# Patient Record
Sex: Female | Born: 1955 | Race: White | Hispanic: No | Marital: Single | Smoking: Never smoker
Health system: Southern US, Community
[De-identification: ages and names within clinical notes are randomized; demographics above are authoritative.]

## PROBLEM LIST (undated history)

## (undated) DIAGNOSIS — D239 Other benign neoplasm of skin, unspecified: Secondary | ICD-10-CM

## (undated) DIAGNOSIS — L409 Psoriasis, unspecified: Secondary | ICD-10-CM

## (undated) DIAGNOSIS — M797 Fibromyalgia: Secondary | ICD-10-CM

## (undated) DIAGNOSIS — F3181 Bipolar II disorder: Secondary | ICD-10-CM

## (undated) HISTORY — DX: Other benign neoplasm of skin, unspecified: D23.9

## (undated) HISTORY — PX: TONSILLECTOMY: SUR1361

## (undated) HISTORY — PX: ADENOIDECTOMY: SUR15

## (undated) HISTORY — PX: ABDOMINAL HYSTERECTOMY: SHX81

---

## 1988-10-06 HISTORY — PX: BREAST BIOPSY: SHX20

## 2005-06-12 ENCOUNTER — Ambulatory Visit: Payer: Self-pay | Admitting: Family Medicine

## 2005-08-07 ENCOUNTER — Ambulatory Visit: Payer: Self-pay | Admitting: Unknown Physician Specialty

## 2005-09-03 ENCOUNTER — Emergency Department: Payer: Self-pay | Admitting: Emergency Medicine

## 2006-06-15 ENCOUNTER — Ambulatory Visit: Payer: Self-pay | Admitting: Family Medicine

## 2007-10-28 ENCOUNTER — Ambulatory Visit: Payer: Self-pay | Admitting: Family Medicine

## 2008-10-30 ENCOUNTER — Ambulatory Visit: Payer: Self-pay | Admitting: Family Medicine

## 2008-11-10 ENCOUNTER — Ambulatory Visit: Payer: Self-pay | Admitting: Family Medicine

## 2009-03-21 ENCOUNTER — Encounter: Payer: Self-pay | Admitting: Nurse Practitioner

## 2009-04-05 ENCOUNTER — Encounter: Payer: Self-pay | Admitting: Nurse Practitioner

## 2009-11-13 ENCOUNTER — Ambulatory Visit: Payer: Self-pay | Admitting: Family Medicine

## 2010-10-08 ENCOUNTER — Ambulatory Visit: Payer: Self-pay | Admitting: Internal Medicine

## 2011-01-28 ENCOUNTER — Ambulatory Visit: Payer: Self-pay | Admitting: Family Medicine

## 2012-02-10 ENCOUNTER — Ambulatory Visit: Payer: Self-pay | Admitting: Family Medicine

## 2012-02-12 ENCOUNTER — Ambulatory Visit: Payer: Self-pay | Admitting: Family Medicine

## 2012-03-17 ENCOUNTER — Ambulatory Visit: Payer: Self-pay | Admitting: Family Medicine

## 2012-03-17 LAB — CBC WITH DIFFERENTIAL/PLATELET
Basophil #: 0 10*3/uL (ref 0.0–0.1)
Basophil %: 1 %
Eosinophil #: 0.1 10*3/uL (ref 0.0–0.7)
HCT: 40 % (ref 35.0–47.0)
HGB: 13.5 g/dL (ref 12.0–16.0)
Lymphocyte #: 1.5 10*3/uL (ref 1.0–3.6)
Lymphocyte %: 36.3 %
MCH: 32 pg (ref 26.0–34.0)
MCHC: 33.9 g/dL (ref 32.0–36.0)
Monocyte #: 0.3 x10 3/mm (ref 0.2–0.9)
Neutrophil %: 53.2 %
RBC: 4.24 10*6/uL (ref 3.80–5.20)
WBC: 4.2 10*3/uL (ref 3.6–11.0)

## 2012-03-17 LAB — FOLATE: Folic Acid: 13 ng/mL (ref 3.1–100.0)

## 2013-04-26 ENCOUNTER — Ambulatory Visit: Payer: Self-pay | Admitting: Family Medicine

## 2014-03-01 ENCOUNTER — Ambulatory Visit: Payer: Self-pay | Admitting: Family Medicine

## 2014-05-12 ENCOUNTER — Ambulatory Visit: Payer: Self-pay | Admitting: Unknown Physician Specialty

## 2014-05-18 ENCOUNTER — Ambulatory Visit: Payer: Self-pay | Admitting: Podiatrist

## 2014-05-25 ENCOUNTER — Encounter: Payer: Self-pay | Admitting: Podiatry

## 2014-05-25 ENCOUNTER — Ambulatory Visit (INDEPENDENT_AMBULATORY_CARE_PROVIDER_SITE_OTHER): Payer: Commercial Managed Care - HMO

## 2014-05-25 ENCOUNTER — Ambulatory Visit (INDEPENDENT_AMBULATORY_CARE_PROVIDER_SITE_OTHER): Payer: Commercial Managed Care - HMO | Admitting: Podiatry

## 2014-05-25 VITALS — BP 130/73 | HR 63 | Resp 16 | Ht 65.0 in | Wt 129.0 lb

## 2014-05-25 DIAGNOSIS — M7741 Metatarsalgia, right foot: Secondary | ICD-10-CM

## 2014-05-25 DIAGNOSIS — M7742 Metatarsalgia, left foot: Secondary | ICD-10-CM

## 2014-05-25 DIAGNOSIS — M722 Plantar fascial fibromatosis: Secondary | ICD-10-CM

## 2014-05-25 DIAGNOSIS — M7750 Other enthesopathy of unspecified foot: Secondary | ICD-10-CM

## 2014-05-25 MED ORDER — TRIAMCINOLONE ACETONIDE 10 MG/ML IJ SUSP
10.0000 mg | Freq: Once | INTRAMUSCULAR | Status: AC
  Administered 2014-05-25: 10 mg

## 2014-05-25 NOTE — Progress Notes (Signed)
   Subjective:    Patient ID: Cheryl Schroeder, female    DOB: Aug 15, 1956, 58 y.o.   MRN: [id number]  HPI Comments: 58 year old female presents the office today with multiple foot complaints bilaterally. 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 She also states that she has bilateral heel pain has been intermittent over the last 4 years which has increased recently specifically while walking. Also states the pain is worse in the morning or after. Her rest and gets some relief with ambulation. She had no previous treatment for this. She also states that she has pain in the balls of her feet which has been ongoing for many years. Denies any tenderness to the feet/toes.  No other complaints at this time.  Foot Pain      Review of Systems  HENT: Positive for sneezing.        Sinus problems   Musculoskeletal:       Joint pain  Neurological: Positive for dizziness.  Psychiatric/Behavioral: Positive for behavioral problems.  All other systems reviewed and are negative.      Objective:   Physical Exam  Nursing note and vitals reviewed. Constitutional: She is oriented to person, place, and time. She appears well-developed and well-nourished.  Musculoskeletal: Normal range of motion. She exhibits no edema.  Tenderness to palpation over the medial plantar tubercle of the calcaneus at the insertion of the plantar fascia bilaterally. No pain along the course of the plantar fascia within the arch and the plantar fascia is intact. Diffuse tenderness over the metatarsal heads bilaterally over an area of thin area of diffuse hyperkeratotic tissue. Is mild atrophy of the fat pad the metatarsal heads bilaterally. MMT 5/5. Rectus foot type. No tenderness over the left third interspace to suggest a neuroma.  Neurological: She is alert and oriented to person, place, and time.  Protective  sensation intact with the Semmes Weinstein monofilament and vibratory sensation intact.  Skin:  Thin, diffuse hyperkeratotic tissue along the metatarsal heads bilaterally. No open lesions.  DP/PT pulses palpable 2/4 b/l. CRT < 3 sec.         Assessment & Plan:  58 year old female with bilateral plantar fasciitis and metatarsalgia. -At this appointment x-rays were obtained of the bilateral feet. The x-ray result for full details. No acute fracture -Conservative versus surgical intervention was discussed with the patient in great detail including alternatives, risks, complications as well as the etiology. -For heel pain, at this time I would recommend a steroid injection into the area to help calm down some localized inflammation. Patient agrees. Under sterile skin preparation a total of a 2 cc mixture of Kenalog 10, Marcaine 0.5% plain, lidocaine 2% plain was infiltrated into the plantar medial aspect of the calcaneus around the insertion of the plantar fascia. This was preformed bilaterally.Patient tolerated the injection well without any complications. Dispensed bilateral plantar fascial braces. Discussed stretching exercises as well as ice to the area.  -Metatarsal pads dispensed. Upon weightbearing patient states that he felt good and gave her relief in the area. -Followup in one month or sooner if any palms are to arise. Call with any questions/concerns.

## 2014-05-25 NOTE — Patient Instructions (Signed)
Plantar Fasciitis (Heel Spur Syndrome) with Rehab The plantar fascia is a fibrous, ligament-like, soft-tissue structure that spans the bottom of the foot. Plantar fasciitis is a condition that causes pain in the foot due to inflammation of the tissue. SYMPTOMS   Pain and tenderness on the underneath side of the foot.  Pain that worsens with standing or walking. CAUSES  Plantar fasciitis is caused by irritation and injury to the plantar fascia on the underneath side of the foot. Common mechanisms of injury include:  Direct trauma to bottom of the foot.  Damage to a small nerve that runs under the foot where the main fascia attaches to the heel bone.  Stress placed on the plantar fascia due to bone spurs. RISK INCREASES WITH:   Activities that place stress on the plantar fascia (running, jumping, pivoting, or cutting).  Poor strength and flexibility.  Improperly fitted shoes.  Tight calf muscles.  Flat feet.  Failure to warm-up properly before activity.  Obesity. PREVENTION  Warm up and stretch properly before activity.  Allow for adequate recovery between workouts.  Maintain physical fitness:  Strength, flexibility, and endurance.  Cardiovascular fitness.  Maintain a health body weight.  Avoid stress on the plantar fascia.  Wear properly fitted shoes, including arch supports for individuals who have flat feet. PROGNOSIS  If treated properly, then the symptoms of plantar fasciitis usually resolve without surgery. However, occasionally surgery is necessary. RELATED COMPLICATIONS   Recurrent symptoms that may result in a chronic condition.  Problems of the lower back that are caused by compensating for the injury, such as limping.  Pain or weakness of the foot during push-off following surgery.  Chronic inflammation, scarring, and partial or complete fascia tear, occurring more often from repeated injections. TREATMENT  Treatment initially involves the use of  ice and medication to help reduce pain and inflammation. The use of strengthening and stretching exercises may help reduce pain with activity, especially stretches of the Achilles tendon. These exercises may be performed at home or with a therapist. Your caregiver may recommend that you use heel cups of arch supports to help reduce stress on the plantar fascia. Occasionally, corticosteroid injections are given to reduce inflammation. If symptoms persist for greater than 6 months despite non-surgical (conservative), then surgery may be recommended.  MEDICATION   If pain medication is necessary, then nonsteroidal anti-inflammatory medications, such as aspirin and ibuprofen, or other minor pain relievers, such as acetaminophen, are often recommended.  Do not take pain medication within 7 days before surgery.  Prescription pain relievers may be given if deemed necessary by your caregiver. Use only as directed and only as much as you need.  Corticosteroid injections may be given by your caregiver. These injections should be reserved for the most serious cases, because they may only be given a certain number of times. HEAT AND COLD  Cold treatment (icing) relieves pain and reduces inflammation. Cold treatment should be applied for 10 to 15 minutes every 2 to 3 hours for inflammation and pain and immediately after any activity that aggravates your symptoms. Use ice packs or massage the area with a piece of ice (ice massage).  Heat treatment may be used prior to performing the stretching and strengthening activities prescribed by your caregiver, physical therapist, or athletic trainer. Use a heat pack or soak the injury in warm water. SEEK IMMEDIATE MEDICAL CARE IF:  Treatment seems to offer no benefit, or the condition worsens.  Any medications produce adverse side effects. EXERCISES RANGE   OF MOTION (ROM) AND STRETCHING EXERCISES - Plantar Fasciitis (Heel Spur Syndrome) These exercises may help you  when beginning to rehabilitate your injury. Your symptoms may resolve with or without further involvement from your physician, physical therapist or athletic trainer. While completing these exercises, remember:   Restoring tissue flexibility helps normal motion to return to the joints. This allows healthier, less painful movement and activity.  An effective stretch should be held for at least 30 seconds.  A stretch should never be painful. You should only feel a gentle lengthening or release in the stretched tissue. RANGE OF MOTION - Toe Extension, Flexion  Sit with your right / left leg crossed over your opposite knee.  Grasp your toes and gently pull them back toward the top of your foot. You should feel a stretch on the bottom of your toes and/or foot.  Hold this stretch for __________ seconds.  Now, gently pull your toes toward the bottom of your foot. You should feel a stretch on the top of your toes and or foot.  Hold this stretch for __________ seconds. Repeat __________ times. Complete this stretch __________ times per day.  RANGE OF MOTION - Ankle Dorsiflexion, Active Assisted  Remove shoes and sit on a chair that is preferably not on a carpeted surface.  Place right / left foot under knee. Extend your opposite leg for support.  Keeping your heel down, slide your right / left foot back toward the chair until you feel a stretch at your ankle or calf. If you do not feel a stretch, slide your bottom forward to the edge of the chair, while still keeping your heel down.  Hold this stretch for __________ seconds. Repeat __________ times. Complete this stretch __________ times per day.  STRETCH - Gastroc, Standing  Place hands on wall.  Extend right / left leg, keeping the front knee somewhat bent.  Slightly point your toes inward on your back foot.  Keeping your right / left heel on the floor and your knee straight, shift your weight toward the wall, not allowing your back to  arch.  You should feel a gentle stretch in the right / left calf. Hold this position for __________ seconds. Repeat __________ times. Complete this stretch __________ times per day. STRETCH - Soleus, Standing  Place hands on wall.  Extend right / left leg, keeping the other knee somewhat bent.  Slightly point your toes inward on your back foot.  Keep your right / left heel on the floor, bend your back knee, and slightly shift your weight over the back leg so that you feel a gentle stretch deep in your back calf.  Hold this position for __________ seconds. Repeat __________ times. Complete this stretch __________ times per day. STRETCH - Gastrocsoleus, Standing  Note: This exercise can place a lot of stress on your foot and ankle. Please complete this exercise only if specifically instructed by your caregiver.   Place the ball of your right / left foot on a step, keeping your other foot firmly on the same step.  Hold on to the wall or a rail for balance.  Slowly lift your other foot, allowing your body weight to press your heel down over the edge of the step.  You should feel a stretch in your right / left calf.  Hold this position for __________ seconds.  Repeat this exercise with a slight bend in your right / left knee. Repeat __________ times. Complete this stretch __________ times per day.    STRENGTHENING EXERCISES - Plantar Fasciitis (Heel Spur Syndrome)  These exercises may help you when beginning to rehabilitate your injury. They may resolve your symptoms with or without further involvement from your physician, physical therapist or athletic trainer. While completing these exercises, remember:   Muscles can gain both the endurance and the strength needed for everyday activities through controlled exercises.  Complete these exercises as instructed by your physician, physical therapist or athletic trainer. Progress the resistance and repetitions only as guided. STRENGTH -  Towel Curls  Sit in a chair positioned on a non-carpeted surface.  Place your foot on a towel, keeping your heel on the floor.  Pull the towel toward your heel by only curling your toes. Keep your heel on the floor.  If instructed by your physician, physical therapist or athletic trainer, add ____________________ at the end of the towel. Repeat __________ times. Complete this exercise __________ times per day. STRENGTH - Ankle Inversion  Secure one end of a rubber exercise band/tubing to a fixed object (table, pole). Loop the other end around your foot just before your toes.  Place your fists between your knees. This will focus your strengthening at your ankle.  Slowly, pull your big toe up and in, making sure the band/tubing is positioned to resist the entire motion.  Hold this position for __________ seconds.  Have your muscles resist the band/tubing as it slowly pulls your foot back to the starting position. Repeat __________ times. Complete this exercises __________ times per day.  Document Released: 09/22/2005 Document Revised: 12/15/2011 Document Reviewed: 01/04/2009 [employer] Patient Information 2015 [employer]. This information is not intended to replace advice given to you by your health care provider. Make sure you discuss any questions you have with your health care provider.  

## 2014-06-29 ENCOUNTER — Ambulatory Visit: Payer: Commercial Managed Care - HMO | Admitting: Podiatry

## 2014-07-06 ENCOUNTER — Ambulatory Visit (INDEPENDENT_AMBULATORY_CARE_PROVIDER_SITE_OTHER): Payer: Commercial Managed Care - HMO | Admitting: Podiatry

## 2014-07-06 VITALS — BP 112/60 | HR 74 | Resp 16

## 2014-07-06 DIAGNOSIS — M722 Plantar fascial fibromatosis: Secondary | ICD-10-CM

## 2014-07-06 DIAGNOSIS — M7742 Metatarsalgia, left foot: Secondary | ICD-10-CM

## 2014-07-06 DIAGNOSIS — M7741 Metatarsalgia, right foot: Secondary | ICD-10-CM

## 2014-07-06 MED ORDER — TRIAMCINOLONE ACETONIDE 10 MG/ML IJ SUSP
10.0000 mg | Freq: Once | INTRAMUSCULAR | Status: AC
  Administered 2014-07-06: 10 mg

## 2014-07-06 NOTE — Patient Instructions (Signed)
Plantar Fasciitis (Heel Spur Syndrome) with Rehab The plantar fascia is a fibrous, ligament-like, soft-tissue structure that spans the bottom of the foot. Plantar fasciitis is a condition that causes pain in the foot due to inflammation of the tissue. SYMPTOMS   Pain and tenderness on the underneath side of the foot.  Pain that worsens with standing or walking. CAUSES  Plantar fasciitis is caused by irritation and injury to the plantar fascia on the underneath side of the foot. Common mechanisms of injury include:  Direct trauma to bottom of the foot.  Damage to a small nerve that runs under the foot where the main fascia attaches to the heel bone.  Stress placed on the plantar fascia due to bone spurs. RISK INCREASES WITH:   Activities that place stress on the plantar fascia (running, jumping, pivoting, or cutting).  Poor strength and flexibility.  Improperly fitted shoes.  Tight calf muscles.  Flat feet.  Failure to warm-up properly before activity.  Obesity. PREVENTION  Warm up and stretch properly before activity.  Allow for adequate recovery between workouts.  Maintain physical fitness:  Strength, flexibility, and endurance.  Cardiovascular fitness.  Maintain a health body weight.  Avoid stress on the plantar fascia.  Wear properly fitted shoes, including arch supports for individuals who have flat feet. PROGNOSIS  If treated properly, then the symptoms of plantar fasciitis usually resolve without surgery. However, occasionally surgery is necessary. RELATED COMPLICATIONS   Recurrent symptoms that may result in a chronic condition.  Problems of the lower back that are caused by compensating for the injury, such as limping.  Pain or weakness of the foot during push-off following surgery.  Chronic inflammation, scarring, and partial or complete fascia tear, occurring more often from repeated injections. TREATMENT  Treatment initially involves the use of  ice and medication to help reduce pain and inflammation. The use of strengthening and stretching exercises may help reduce pain with activity, especially stretches of the Achilles tendon. These exercises may be performed at home or with a therapist. Your caregiver may recommend that you use heel cups of arch supports to help reduce stress on the plantar fascia. Occasionally, corticosteroid injections are given to reduce inflammation. If symptoms persist for greater than 6 months despite non-surgical (conservative), then surgery may be recommended.  MEDICATION   If pain medication is necessary, then nonsteroidal anti-inflammatory medications, such as aspirin and ibuprofen, or other minor pain relievers, such as acetaminophen, are often recommended.  Do not take pain medication within 7 days before surgery.  Prescription pain relievers may be given if deemed necessary by your caregiver. Use only as directed and only as much as you need.  Corticosteroid injections may be given by your caregiver. These injections should be reserved for the most serious cases, because they may only be given a certain number of times. HEAT AND COLD  Cold treatment (icing) relieves pain and reduces inflammation. Cold treatment should be applied for 10 to 15 minutes every 2 to 3 hours for inflammation and pain and immediately after any activity that aggravates your symptoms. Use ice packs or massage the area with a piece of ice (ice massage).  Heat treatment may be used prior to performing the stretching and strengthening activities prescribed by your caregiver, physical therapist, or athletic trainer. Use a heat pack or soak the injury in warm water. SEEK IMMEDIATE MEDICAL CARE IF:  Treatment seems to offer no benefit, or the condition worsens.  Any medications produce adverse side effects. EXERCISES RANGE   OF MOTION (ROM) AND STRETCHING EXERCISES - Plantar Fasciitis (Heel Spur Syndrome) These exercises may help you  when beginning to rehabilitate your injury. Your symptoms may resolve with or without further involvement from your physician, physical therapist or athletic trainer. While completing these exercises, remember:   Restoring tissue flexibility helps normal motion to return to the joints. This allows healthier, less painful movement and activity.  An effective stretch should be held for at least 30 seconds.  A stretch should never be painful. You should only feel a gentle lengthening or release in the stretched tissue. RANGE OF MOTION - Toe Extension, Flexion  Sit with your right / left leg crossed over your opposite knee.  Grasp your toes and gently pull them back toward the top of your foot. You should feel a stretch on the bottom of your toes and/or foot.  Hold this stretch for __________ seconds.  Now, gently pull your toes toward the bottom of your foot. You should feel a stretch on the top of your toes and or foot.  Hold this stretch for __________ seconds. Repeat __________ times. Complete this stretch __________ times per day.  RANGE OF MOTION - Ankle Dorsiflexion, Active Assisted  Remove shoes and sit on a chair that is preferably not on a carpeted surface.  Place right / left foot under knee. Extend your opposite leg for support.  Keeping your heel down, slide your right / left foot back toward the chair until you feel a stretch at your ankle or calf. If you do not feel a stretch, slide your bottom forward to the edge of the chair, while still keeping your heel down.  Hold this stretch for __________ seconds. Repeat __________ times. Complete this stretch __________ times per day.  STRETCH - Gastroc, Standing  Place hands on wall.  Extend right / left leg, keeping the front knee somewhat bent.  Slightly point your toes inward on your back foot.  Keeping your right / left heel on the floor and your knee straight, shift your weight toward the wall, not allowing your back to  arch.  You should feel a gentle stretch in the right / left calf. Hold this position for __________ seconds. Repeat __________ times. Complete this stretch __________ times per day. STRETCH - Soleus, Standing  Place hands on wall.  Extend right / left leg, keeping the other knee somewhat bent.  Slightly point your toes inward on your back foot.  Keep your right / left heel on the floor, bend your back knee, and slightly shift your weight over the back leg so that you feel a gentle stretch deep in your back calf.  Hold this position for __________ seconds. Repeat __________ times. Complete this stretch __________ times per day. STRETCH - Gastrocsoleus, Standing  Note: This exercise can place a lot of stress on your foot and ankle. Please complete this exercise only if specifically instructed by your caregiver.   Place the ball of your right / left foot on a step, keeping your other foot firmly on the same step.  Hold on to the wall or a rail for balance.  Slowly lift your other foot, allowing your body weight to press your heel down over the edge of the step.  You should feel a stretch in your right / left calf.  Hold this position for __________ seconds.  Repeat this exercise with a slight bend in your right / left knee. Repeat __________ times. Complete this stretch __________ times per day.    STRENGTHENING EXERCISES - Plantar Fasciitis (Heel Spur Syndrome)  These exercises may help you when beginning to rehabilitate your injury. They may resolve your symptoms with or without further involvement from your physician, physical therapist or athletic trainer. While completing these exercises, remember:   Muscles can gain both the endurance and the strength needed for everyday activities through controlled exercises.  Complete these exercises as instructed by your physician, physical therapist or athletic trainer. Progress the resistance and repetitions only as guided. STRENGTH -  Towel Curls  Sit in a chair positioned on a non-carpeted surface.  Place your foot on a towel, keeping your heel on the floor.  Pull the towel toward your heel by only curling your toes. Keep your heel on the floor.  If instructed by your physician, physical therapist or athletic trainer, add ____________________ at the end of the towel. Repeat __________ times. Complete this exercise __________ times per day. STRENGTH - Ankle Inversion  Secure one end of a rubber exercise band/tubing to a fixed object (table, pole). Loop the other end around your foot just before your toes.  Place your fists between your knees. This will focus your strengthening at your ankle.  Slowly, pull your big toe up and in, making sure the band/tubing is positioned to resist the entire motion.  Hold this position for __________ seconds.  Have your muscles resist the band/tubing as it slowly pulls your foot back to the starting position. Repeat __________ times. Complete this exercises __________ times per day.  Document Released: 09/22/2005 Document Revised: 12/15/2011 Document Reviewed: 01/04/2009 [employer] Patient Information 2015 [employer]. This information is not intended to replace advice given to you by your health care provider. Make sure you discuss any questions you have with your health care provider.  

## 2014-07-09 DIAGNOSIS — M722 Plantar fascial fibromatosis: Secondary | ICD-10-CM | POA: Insufficient documentation

## 2014-07-09 NOTE — Progress Notes (Signed)
Patient ID: Cheryl Schroeder, female   DOB: 25-Jul-1956, 58 y.o.   MRN: [id number]  Subjective: Cheryl Schroeder returns the office they for followup evaluation of bilateral plantar fasciitis and metatarsalgia. She states that since last appointment after receiving injections her heels been improved. She has been continuing with icing, stretching. She has been wearing the plantar fascial braces. She does that she wore the metatarsal pads in the first couple days after the last appointment but since discontinued them as they were warned palpation she was unable to purchase an over-the-counter. No other complaints at this time. No acute changes since last appointment.  Objective: AAO x3, NAD DP/PT pulses palpable 2/4 b/l. CRT < 3 sec Protective sensation intact with Simms Weinstein monofilament, vibratory sensation intact, Achilles tendon reflex intact. Mild tenderness to palpation of the plantar medial tubercle of the calcaneus near the insertion the plantar fascia bilaterally. No pain along the course of plantar fascial within the arch of the foot. No pain with lateral compression of the calcaneus or along the posterior aspect. Overall rectus foot type. Mild diffuse tenderness over the plantar metatarsal heads with thinning of the fat pad. No pinpoint bony tenderness or pain the vibratory sensation.MMT 5/5, ROM WNL No open skin lesions. No calf pain, swelling, warmth.  Assessment: 58 year old female bilateral plantar fasciitis and metatarsalgia. -Conservative versus surgical treatment were discussed including alternatives, risks, complications. -Patient elects to proceed with steroid injection into the bilateral heels. Under sterile skin preparation, a total of 2.5cc of kenalog 10, 0.5% Marcaine plain, and 2% lidocaine plain were infiltrated into the symptomatic area without complication. A band-aid was applied. Patient tolerated the injection well without complication.  -Continue ice, stretching, plantar  fascial braces. -Dispensed new metatarsal pads. Discussed possible need for orthotics. -Followup in 3 weeks or sooner if any problems are to arise or any change in symptoms. In the meantime call the office with any questions, concerns.

## 2014-07-27 ENCOUNTER — Encounter: Payer: Self-pay | Admitting: Podiatry

## 2014-07-27 ENCOUNTER — Ambulatory Visit (INDEPENDENT_AMBULATORY_CARE_PROVIDER_SITE_OTHER): Payer: Medicare PPO | Admitting: Podiatry

## 2014-07-27 VITALS — BP 107/67 | HR 68 | Resp 17

## 2014-07-27 DIAGNOSIS — M7741 Metatarsalgia, right foot: Secondary | ICD-10-CM

## 2014-07-27 DIAGNOSIS — M7742 Metatarsalgia, left foot: Secondary | ICD-10-CM

## 2014-07-27 DIAGNOSIS — M722 Plantar fascial fibromatosis: Secondary | ICD-10-CM

## 2014-07-27 NOTE — Patient Instructions (Signed)
Plantar Fasciitis (Heel Spur Syndrome) with Rehab The plantar fascia is a fibrous, ligament-like, soft-tissue structure that spans the bottom of the foot. Plantar fasciitis is a condition that causes pain in the foot due to inflammation of the tissue. SYMPTOMS   Pain and tenderness on the underneath side of the foot.  Pain that worsens with standing or walking. CAUSES  Plantar fasciitis is caused by irritation and injury to the plantar fascia on the underneath side of the foot. Common mechanisms of injury include:  Direct trauma to bottom of the foot.  Damage to a small nerve that runs under the foot where the main fascia attaches to the heel bone.  Stress placed on the plantar fascia due to bone spurs. RISK INCREASES WITH:   Activities that place stress on the plantar fascia (running, jumping, pivoting, or cutting).  Poor strength and flexibility.  Improperly fitted shoes.  Tight calf muscles.  Flat feet.  Failure to warm-up properly before activity.  Obesity. PREVENTION  Warm up and stretch properly before activity.  Allow for adequate recovery between workouts.  Maintain physical fitness:  Strength, flexibility, and endurance.  Cardiovascular fitness.  Maintain a health body weight.  Avoid stress on the plantar fascia.  Wear properly fitted shoes, including arch supports for individuals who have flat feet. PROGNOSIS  If treated properly, then the symptoms of plantar fasciitis usually resolve without surgery. However, occasionally surgery is necessary. RELATED COMPLICATIONS   Recurrent symptoms that may result in a chronic condition.  Problems of the lower back that are caused by compensating for the injury, such as limping.  Pain or weakness of the foot during push-off following surgery.  Chronic inflammation, scarring, and partial or complete fascia tear, occurring more often from repeated injections. TREATMENT  Treatment initially involves the use of  ice and medication to help reduce pain and inflammation. The use of strengthening and stretching exercises may help reduce pain with activity, especially stretches of the Achilles tendon. These exercises may be performed at home or with a therapist. Your caregiver may recommend that you use heel cups of arch supports to help reduce stress on the plantar fascia. Occasionally, corticosteroid injections are given to reduce inflammation. If symptoms persist for greater than 6 months despite non-surgical (conservative), then surgery may be recommended.  MEDICATION   If pain medication is necessary, then nonsteroidal anti-inflammatory medications, such as aspirin and ibuprofen, or other minor pain relievers, such as acetaminophen, are often recommended.  Do not take pain medication within 7 days before surgery.  Prescription pain relievers may be given if deemed necessary by your caregiver. Use only as directed and only as much as you need.  Corticosteroid injections may be given by your caregiver. These injections should be reserved for the most serious cases, because they may only be given a certain number of times. HEAT AND COLD  Cold treatment (icing) relieves pain and reduces inflammation. Cold treatment should be applied for 10 to 15 minutes every 2 to 3 hours for inflammation and pain and immediately after any activity that aggravates your symptoms. Use ice packs or massage the area with a piece of ice (ice massage).  Heat treatment may be used prior to performing the stretching and strengthening activities prescribed by your caregiver, physical therapist, or athletic trainer. Use a heat pack or soak the injury in warm water. SEEK IMMEDIATE MEDICAL CARE IF:  Treatment seems to offer no benefit, or the condition worsens.  Any medications produce adverse side effects. EXERCISES RANGE   OF MOTION (ROM) AND STRETCHING EXERCISES - Plantar Fasciitis (Heel Spur Syndrome) These exercises may help you  when beginning to rehabilitate your injury. Your symptoms may resolve with or without further involvement from your physician, physical therapist or athletic trainer. While completing these exercises, remember:   Restoring tissue flexibility helps normal motion to return to the joints. This allows healthier, less painful movement and activity.  An effective stretch should be held for at least 30 seconds.  A stretch should never be painful. You should only feel a gentle lengthening or release in the stretched tissue. RANGE OF MOTION - Toe Extension, Flexion  Sit with your right / left leg crossed over your opposite knee.  Grasp your toes and gently pull them back toward the top of your foot. You should feel a stretch on the bottom of your toes and/or foot.  Hold this stretch for __________ seconds.  Now, gently pull your toes toward the bottom of your foot. You should feel a stretch on the top of your toes and or foot.  Hold this stretch for __________ seconds. Repeat __________ times. Complete this stretch __________ times per day.  RANGE OF MOTION - Ankle Dorsiflexion, Active Assisted  Remove shoes and sit on a chair that is preferably not on a carpeted surface.  Place right / left foot under knee. Extend your opposite leg for support.  Keeping your heel down, slide your right / left foot back toward the chair until you feel a stretch at your ankle or calf. If you do not feel a stretch, slide your bottom forward to the edge of the chair, while still keeping your heel down.  Hold this stretch for __________ seconds. Repeat __________ times. Complete this stretch __________ times per day.  STRETCH - Gastroc, Standing  Place hands on wall.  Extend right / left leg, keeping the front knee somewhat bent.  Slightly point your toes inward on your back foot.  Keeping your right / left heel on the floor and your knee straight, shift your weight toward the wall, not allowing your back to  arch.  You should feel a gentle stretch in the right / left calf. Hold this position for __________ seconds. Repeat __________ times. Complete this stretch __________ times per day. STRETCH - Soleus, Standing  Place hands on wall.  Extend right / left leg, keeping the other knee somewhat bent.  Slightly point your toes inward on your back foot.  Keep your right / left heel on the floor, bend your back knee, and slightly shift your weight over the back leg so that you feel a gentle stretch deep in your back calf.  Hold this position for __________ seconds. Repeat __________ times. Complete this stretch __________ times per day. STRETCH - Gastrocsoleus, Standing  Note: This exercise can place a lot of stress on your foot and ankle. Please complete this exercise only if specifically instructed by your caregiver.   Place the ball of your right / left foot on a step, keeping your other foot firmly on the same step.  Hold on to the wall or a rail for balance.  Slowly lift your other foot, allowing your body weight to press your heel down over the edge of the step.  You should feel a stretch in your right / left calf.  Hold this position for __________ seconds.  Repeat this exercise with a slight bend in your right / left knee. Repeat __________ times. Complete this stretch __________ times per day.    STRENGTHENING EXERCISES - Plantar Fasciitis (Heel Spur Syndrome)  These exercises may help you when beginning to rehabilitate your injury. They may resolve your symptoms with or without further involvement from your physician, physical therapist or athletic trainer. While completing these exercises, remember:   Muscles can gain both the endurance and the strength needed for everyday activities through controlled exercises.  Complete these exercises as instructed by your physician, physical therapist or athletic trainer. Progress the resistance and repetitions only as guided. STRENGTH -  Towel Curls  Sit in a chair positioned on a non-carpeted surface.  Place your foot on a towel, keeping your heel on the floor.  Pull the towel toward your heel by only curling your toes. Keep your heel on the floor.  If instructed by your physician, physical therapist or athletic trainer, add ____________________ at the end of the towel. Repeat __________ times. Complete this exercise __________ times per day. STRENGTH - Ankle Inversion  Secure one end of a rubber exercise band/tubing to a fixed object (table, pole). Loop the other end around your foot just before your toes.  Place your fists between your knees. This will focus your strengthening at your ankle.  Slowly, pull your big toe up and in, making sure the band/tubing is positioned to resist the entire motion.  Hold this position for __________ seconds.  Have your muscles resist the band/tubing as it slowly pulls your foot back to the starting position. Repeat __________ times. Complete this exercises __________ times per day.  Document Released: 09/22/2005 Document Revised: 12/15/2011 Document Reviewed: 01/04/2009 [employer] Patient Information 2015 [employer]. This information is not intended to replace advice given to you by your health care provider. Make sure you discuss any questions you have with your health care provider.  

## 2014-07-27 NOTE — Progress Notes (Signed)
Patient ID: Cheryl Schroeder, female   DOB: 1956/09/12, 58 y.o.   MRN: [id number]  Subjective: Patient returns the office they for followup evaluation of bilateral plantar fasciitis and metatarsalgia. She states that since wearing the inserts and metatarsal pads she has noticed improvement in symptoms in the ball of her foot. She also states that her heel pain has improved although she continues to have some discomfort. She states that she has not been icing or stretching. She's been continuing with a tennis shoes and wearing plantar fascial brace. Denies any acute changes since last appointment. No other complaints at this time.  Objective: AAO x3, NAD DP/PT pulses palpable bilaterally, CRT less than 3 seconds Protective sensation intact with Simms Weinstein monofilament, vibratory sensation intact, Achilles tendon reflex intact Mild tenderness on the plantar medial tubercle of the calcaneus bilaterally at the insertion of the plantar fascia. No pain with lateral compression of the calcaneus or along the posterior aspect. No pain along the course of plantar fascial within the arch of the foot. Plantar fascia appears intact. Mild diffuse tenderness plantar metatarsal heads 1 through 5. There is no pinpoint bony tenderness or pain with vibratory sensation. No pain with range of motion of the MTPJ's. MMT 5/5, ROM WNL No calf pain, swelling, warmth No open lesions.  Assessment: 58 year old female with bilateral plantar fasciitis, metatarsalgia.  Plan: -Conservative versus surgical treatment discussed including alternatives, risks, complications -Discussed with the patient to continue stretching exercises as well as icing of the affected area. -Continue with supportive shoe gear and plantar fascial brace. -Continue with inserts and metatarsal pads. Discussed with her possible need for custom inserts. -Patient wishes to hold off on an injection at this time. -Followup in one month or sooner if any  problems are to occur or any changes symptoms. Patient states that she will call for appointment. Call with any questions, concerns.

## 2014-09-05 ENCOUNTER — Ambulatory Visit: Payer: Self-pay | Admitting: Family Medicine

## 2015-03-27 ENCOUNTER — Ambulatory Visit
Admission: EM | Admit: 2015-03-27 | Discharge: 2015-03-27 | Disposition: A | Discharge status: Home / Self Care | Payer: Medicare HMO | Attending: Family Medicine | Admitting: Family Medicine

## 2015-03-27 ENCOUNTER — Encounter: Payer: Self-pay | Admitting: Emergency Medicine

## 2015-03-27 DIAGNOSIS — L259 Unspecified contact dermatitis, unspecified cause: Secondary | ICD-10-CM

## 2015-03-27 HISTORY — DX: Psoriasis, unspecified: L40.9

## 2015-03-27 HISTORY — DX: Fibromyalgia: M79.7

## 2015-03-27 HISTORY — DX: Bipolar II disorder: F31.81

## 2015-03-27 MED ORDER — TRIAMCINOLONE ACETONIDE 0.1 % EX CREA: 1.0000 "application " | TOPICAL_CREAM | Freq: Three times a day (TID) | CUTANEOUS | Status: DC

## 2015-03-27 MED ORDER — PREDNISONE 20 MG PO TABS: 20.0000 mg | ORAL_TABLET | Freq: Every day | ORAL | Status: DC

## 2015-03-27 NOTE — ED Notes (Signed)
Patient c/o itchy rash on her left arm, neck and abdomen for a week.

## 2015-03-27 NOTE — ED Provider Notes (Signed)
Marland KitchenedCSN: [id number]     Arrival date & time 03/27/15  1045 History   First MD Initiated Contact with Patient 03/27/15 1159     Chief Complaint  Patient presents with  . Rash   (Consider location/radiation/quality/duration/timing/severity/associated sxs/prior Treatment) HPI Comments: 59 yo female with a reported exposure to poison ivy and an itchy rash on her forearm mainly and slightly on upper chest and right side abdomen. Denies any fevers or chills. Has tried otc medications without any relief.   The history is provided by the patient.    Past Medical History  Diagnosis Date  . Bipolar 2 disorder   . Fibromyalgia   . Psoriasis    Past Surgical History  Procedure Laterality Date  . Tonsillectomy    . Adenoidectomy    . Abdominal hysterectomy     History reviewed. No pertinent family history. History  Substance Use Topics  . Smoking status: Never Smoker   . Smokeless tobacco: Never Used  . Alcohol Use: No   OB History    No data available     Review of Systems  Allergies  Review of patient's allergies indicates no known allergies.  Home Medications   Prior to Admission medications   Medication Sig Start Date End Date Taking? Authorizing Provider  buPROPion (WELLBUTRIN XL) 150 MG 24 hr tablet  04/27/14   Historical Provider, MD  Calcium Carbonate-Vitamin D (CALCIUM + D PO) Take by mouth daily.    Historical Provider, MD  FLUoxetine (PROZAC) 40 MG capsule Take 40 mg by mouth daily.    Historical Provider, MD  gabapentin (NEURONTIN) 300 MG capsule Take 300 mg by mouth 3 (three) times daily.    Historical Provider, MD  HYDROcodone-acetaminophen (NORCO/VICODIN) 5-325 MG per tablet Take 1 tablet by mouth as needed for moderate pain.    Historical Provider, MD  lamoTRIgine (LAMICTAL) 200 MG tablet  04/27/14   Historical Provider, MD  lovastatin (MEVACOR) 40 MG tablet  05/08/14   Historical Provider, MD  predniSONE (DELTASONE) 20 MG tablet Take 1 tablet (20 mg total) by mouth  daily. 03/27/15   Norval Gable, MD  tiZANidine (ZANAFLEX) 4 MG tablet  03/26/14   Historical Provider, MD  triamcinolone cream (KENALOG) 0.1 % Apply 1 application topically 3 (three) times daily. 03/27/15   Norval Gable, MD   BP 116/64 mmHg  Pulse 70  Temp(Src) 97.8 F (36.6 C) (Tympanic)  Resp 16  Ht '5\' 5"'$  (1.651 m)  Wt 130 lb (58.968 kg)  BMI 21.63 kg/m2  SpO2 98%  LMP  (Approximate) Physical Exam  Constitutional: She appears well-developed and well-nourished. No distress.  Skin: Rash noted. Rash is vesicular. She is not diaphoretic. There is erythema.     Erythematous slightly oozing, vesicular rash on forearm mainly, with similar smaller area on upper anterior chest and right trunk area  Vitals reviewed.   ED Course  Procedures (including critical care time) Labs Review Labs Reviewed - No data to display  Imaging Review No results found.   MDM   1. Contact dermatitis    Discharge Medication List as of 03/27/2015 12:30 PM    START taking these medications   Details  predniSONE (DELTASONE) 20 MG tablet Take 1 tablet (20 mg total) by mouth daily., Starting 03/27/2015, Until Discontinued, Normal    triamcinolone cream (KENALOG) 0.1 % Apply 1 application topically 3 (three) times daily., Starting 03/27/2015, Until Discontinued, Normal      Plan: 1. diagnosis reviewed with patient 2. rx as per  orders; risks, benefits, potential side effects reviewed with patient 3. F/u prn if symptoms worsen or don't improve    Norval Gable, MD 03/27/15 1419

## 2016-01-14 ENCOUNTER — Other Ambulatory Visit: Payer: Self-pay | Admitting: Family Medicine

## 2016-01-14 DIAGNOSIS — Z1231 Encounter for screening mammogram for malignant neoplasm of breast: Secondary | ICD-10-CM

## 2016-01-17 ENCOUNTER — Ambulatory Visit: Payer: Medicare HMO

## 2016-01-21 ENCOUNTER — Other Ambulatory Visit: Payer: Self-pay | Admitting: Family Medicine

## 2016-01-21 ENCOUNTER — Ambulatory Visit
Admission: RE | Admit: 2016-01-21 | Discharge: 2016-01-21 | Disposition: A | Discharge status: Home / Self Care | Payer: Medicare HMO | Source: Ambulatory Visit | Attending: Family Medicine | Admitting: Family Medicine

## 2016-01-21 DIAGNOSIS — Z1231 Encounter for screening mammogram for malignant neoplasm of breast: Secondary | ICD-10-CM | POA: Diagnosis present

## 2016-06-14 ENCOUNTER — Ambulatory Visit
Admission: EM | Admit: 2016-06-14 | Discharge: 2016-06-14 | Disposition: A | Discharge status: Home / Self Care | Payer: Medicare HMO | Attending: Registered Nurse | Admitting: Registered Nurse

## 2016-06-14 DIAGNOSIS — H6593 Unspecified nonsuppurative otitis media, bilateral: Secondary | ICD-10-CM

## 2016-06-14 DIAGNOSIS — J302 Other seasonal allergic rhinitis: Secondary | ICD-10-CM | POA: Diagnosis not present

## 2016-06-14 DIAGNOSIS — L255 Unspecified contact dermatitis due to plants, except food: Secondary | ICD-10-CM | POA: Diagnosis not present

## 2016-06-14 MED ORDER — CETIRIZINE HCL 10 MG PO TABS: 10.0000 mg | ORAL_TABLET | Freq: Two times a day (BID) | ORAL | 0 refills | Status: DC

## 2016-06-14 MED ORDER — PREDNISONE 10 MG PO TABS: 40.0000 mg | ORAL_TABLET | Freq: Every day | ORAL | 0 refills | Status: AC

## 2016-06-14 NOTE — ED Triage Notes (Signed)
Patient c/o poison ivy, she has been working in the yard without gloves on Monday and it has spread all over her hands, arms, face and neck.

## 2016-06-14 NOTE — ED Provider Notes (Signed)
CSN: [id number]     Arrival date & time 06/14/16  1013 History   First MD Initiated Contact with Patient 06/14/16 1136     Chief Complaint  Patient presents with  . Poison Ivy   (Consider location/radiation/quality/duration/timing/severity/associated sxs/prior Treatment) Single caucasian female here for spreading poison ivy started left forearm than spread to face and hands and torso.  Not on legs yet.  Was gardening earlier this week trimming bushes.  Has been changing linens and towels. Has pets in home.  Had poison ivy a year ago seen by Dr Zenda Alpers did 1 week of prednisone '20mg'$  worked well but patient stated affected area was much smaller (arm only)  Denied dyspnea/dysphagia/changes in vision.  Very itchy/scratching blisters popped on hands.  Has tried lotions and claritin without much relief of itching.      Past Medical History:  Diagnosis Date  . Bipolar 2 disorder ([place])   . Fibromyalgia   . Psoriasis    Past Surgical History:  Procedure Laterality Date  . ABDOMINAL HYSTERECTOMY    . ADENOIDECTOMY    . BREAST BIOPSY Right   . TONSILLECTOMY     Family History  Problem Relation Age of Onset  . Breast cancer Maternal Aunt    Social History  Substance Use Topics  . Smoking status: Never Smoker  . Smokeless tobacco: Never Used  . Alcohol use No   OB History    No data available     Review of Systems  Constitutional: Negative for activity change, appetite change, chills, diaphoresis, fatigue, fever and unexpected weight change.  HENT: Positive for congestion and postnasal drip. Negative for dental problem, drooling, ear discharge, ear pain, facial swelling, hearing loss, mouth sores, nosebleeds, rhinorrhea, sinus pressure, sneezing, sore throat, tinnitus, trouble swallowing and voice change.   Eyes: Negative for photophobia, pain, discharge, redness, itching and visual disturbance.  Respiratory: Negative for cough, choking, chest tightness, shortness of breath, wheezing and  stridor.   Cardiovascular: Negative for chest pain, palpitations and leg swelling.  Gastrointestinal: Negative for abdominal distention, abdominal pain, blood in stool, constipation, diarrhea, nausea and vomiting.  Endocrine: Negative for cold intolerance and heat intolerance.  Genitourinary: Negative for difficulty urinating, dysuria and hematuria.  Musculoskeletal: Negative for arthralgias, back pain, gait problem, joint swelling, myalgias, neck pain and neck stiffness.  Skin: Positive for rash. Negative for color change, pallor and wound.  Allergic/Immunologic: Positive for environmental allergies. Negative for food allergies.  Neurological: Negative for dizziness, tremors, seizures, syncope, facial asymmetry, speech difficulty, weakness, light-headedness, numbness and headaches.  Hematological: Negative for adenopathy. Does not bruise/bleed easily.  Psychiatric/Behavioral: Negative for agitation, behavioral problems, confusion and sleep disturbance.    Allergies  Review of patient's allergies indicates no known allergies.  Home Medications   Prior to Admission medications   Medication Sig Start Date End Date Taking? Authorizing Provider  FLUoxetine (PROZAC) 40 MG capsule Take 40 mg by mouth daily.   Yes Historical Provider, MD  Calcium Carbonate-Vitamin D (CALCIUM + D PO) Take by mouth daily.    Historical Provider, MD  cetirizine (ZYRTEC) 10 MG tablet Take 1 tablet (10 mg total) by mouth 2 (two) times daily. 06/14/16 06/21/16  Olen Cordial, NP  gabapentin (NEURONTIN) 300 MG capsule Take 300 mg by mouth 3 (three) times daily.    Historical Provider, MD  HYDROcodone-acetaminophen (NORCO/VICODIN) 5-325 MG per tablet Take 1 tablet by mouth as needed for moderate pain.    Historical Provider, MD  lamoTRIgine (LAMICTAL) 200 MG tablet  04/27/14   Historical Provider, MD  predniSONE (DELTASONE) 10 MG tablet Take 4 tablets (40 mg total) by mouth daily with breakfast.  X2d;'30mg'$ x2d;'20mg'$ x7d;'10mg'$ x3d;'5mg'$ x4d 06/14/16 06/28/16  Olen Cordial, NP   Meds Ordered and Administered this Visit  Medications - No data to display  BP 117/74 (BP Location: Right Arm)   Pulse 62   Temp 98.2 F (36.8 C) (Oral)   Resp 18   Ht '5\' 5"'$  (1.651 m)   Wt 125 lb (56.7 kg)   LMP  (Approximate)   SpO2 100%   BMI 20.80 kg/m  No data found.   Physical Exam  Constitutional: She is oriented to person, place, and time. Vital signs are normal. She appears well-developed and well-nourished. She is active and cooperative.  Non-toxic appearance. She does not have a sickly appearance. She does not appear ill. No distress.  HENT:  Head: Normocephalic and atraumatic.  Right Ear: Hearing, external ear and ear canal normal. A middle ear effusion is present.  Left Ear: Hearing, external ear and ear canal normal. A middle ear effusion is present.  Nose: Mucosal edema and rhinorrhea present. No nose lacerations, sinus tenderness, nasal deformity, septal deviation or nasal septal hematoma. No epistaxis.  No foreign bodies. Right sinus exhibits no maxillary sinus tenderness and no frontal sinus tenderness. Left sinus exhibits no maxillary sinus tenderness and no frontal sinus tenderness.  Mouth/Throat: Uvula is midline and mucous membranes are normal. Mucous membranes are not pale, not dry and not cyanotic. She does not have dentures. No oral lesions. No trismus in the jaw. Normal dentition. No dental abscesses, uvula swelling, lacerations or dental caries. Posterior oropharyngeal edema and posterior oropharyngeal erythema present. No oropharyngeal exudate or tonsillar abscesses. Tonsils are 0 on the right. Tonsils are 0 on the left. No tonsillar exudate.  Bilateral TMs with air fluid level; cobblestoning posterior pharynx; bilateral nasal turbinates edema/erythema/clear discharge; bilateral allergic shiners  Eyes: Conjunctivae, EOM and lids are normal. Pupils are equal, round, and reactive to light.  Right eye exhibits no chemosis, no discharge, no exudate and no hordeolum. No foreign body present in the right eye. Left eye exhibits no chemosis, no discharge, no exudate and no hordeolum. No foreign body present in the left eye. Right conjunctiva is not injected. Right conjunctiva has no hemorrhage. Left conjunctiva is not injected. Left conjunctiva has no hemorrhage. No scleral icterus. Right eye exhibits normal extraocular motion and no nystagmus. Left eye exhibits normal extraocular motion and no nystagmus. Right pupil is round and reactive. Left pupil is round and reactive. Pupils are equal.  Neck: Trachea normal, normal range of motion and phonation normal. Neck supple. No tracheal tenderness, no spinous process tenderness and no muscular tenderness present. No neck rigidity. No tracheal deviation, no edema, no erythema and normal range of motion present. No thyroid mass and no thyromegaly present.  Cardiovascular: Normal rate, regular rhythm, S1 normal, S2 normal, normal heart sounds and intact distal pulses.  PMI is not displaced.  Exam reveals no gallop and no friction rub.   No murmur heard. Pulses:      Radial pulses are 2+ on the right side, and 2+ on the left side.  Pulmonary/Chest: Effort normal and breath sounds normal. No accessory muscle usage or stridor. No respiratory distress. She has no decreased breath sounds. She has no wheezes. She has no rhonchi. She has no rales. She exhibits no tenderness.  Abdominal: Soft. She exhibits no distension.  Musculoskeletal: Normal range of motion. She exhibits no edema.  Right shoulder: Normal.       Left shoulder: Normal.       Right elbow: Normal.      Left elbow: Normal.       Right wrist: Normal.       Left wrist: Normal. She exhibits no tenderness and no swelling.       Right hip: Normal.       Left hip: Normal.       Right knee: Normal.       Left knee: Normal.       Cervical back: Normal.       Thoracic back: Normal.        Lumbar back: Normal.       Right forearm: She exhibits tenderness and swelling. She exhibits no bony tenderness, no edema, no deformity and no laceration.       Left forearm: She exhibits tenderness and swelling. She exhibits no bony tenderness, no edema, no deformity and no laceration.       Right hand: Normal.       Left hand: Normal.  Lymphadenopathy:       Head (right side): No submental, no submandibular, no tonsillar, no preauricular, no posterior auricular and no occipital adenopathy present.       Head (left side): No submental, no submandibular, no tonsillar, no preauricular, no posterior auricular and no occipital adenopathy present.    She has no cervical adenopathy.       Right cervical: No superficial cervical, no deep cervical and no posterior cervical adenopathy present.      Left cervical: No superficial cervical, no deep cervical and no posterior cervical adenopathy present.  Neurological: She is alert and oriented to person, place, and time. She has normal strength. She is not disoriented. She displays no atrophy and no tremor. No cranial nerve deficit or sensory deficit. She exhibits normal muscle tone. She displays no seizure activity. Coordination and gait normal. GCS eye subscore is 4. GCS verbal subscore is 5. GCS motor subscore is 6.  Bilateral hand grasp = 5/5; extremities strength = bilaterally 5/5  Skin: Skin is warm and dry. Capillary refill takes less than 2 seconds. Abrasion and rash noted. No bruising, no burn, no ecchymosis, no laceration, no lesion, no petechiae and no purpura noted. Rash is macular, papular, maculopapular and vesicular. Rash is not nodular, not pustular and not urticarial. She is not diaphoretic. There is erythema. No cyanosis. No pallor. Nails show no clubbing.     Fingers abraded areas serous discharge macular erythema to wound bed; left anterior forearm grouped vesicles in linear pattern 5cm length 1/2cm width; 1cm linear grouping vesicles on  erythematous base left forehead and cheek  Psychiatric: She has a normal mood and affect. Her speech is normal and behavior is normal. Judgment and thought content normal. She is not actively hallucinating. Cognition and memory are normal. She is attentive.  Nursing note and vitals reviewed.   Urgent Care Course   Clinical Course    Procedures (including critical care time)  Labs Review Labs Reviewed - No data to display  Imaging Review No results found.      MDM   1. Contact dermatitis due to plant    Widespread greater than 20% body surface area.  1-'2mg'$ /kg Prednisone (max '60mg'$ ) for 7-10 days and taper over next 7-10 days per Up to Date.  Symptomatic therapy suggested e.g. Calamine lotion, benadryl topical TID prn or OTC zyrtec '10mg'$  po BID.  If taking zyrtec stop  claritin.  Could cause increased sedation due to her chronic medications.  Discussed possible medication interactions--stop zyrtec/benadryl/and/or claritin if taking atarax avoid alcohol and driving for 8 hours after atarax as heavy sedation frequently experienced by patients.  Did not want work note.  Warm to cool water soaks and/or oatmeal baths.  Call or return to clinic as needed if these symptoms worsen or fail to improve as anticipated especially lesions noted on eye, visual changes or visual loss.  Discussed avoidance/no contact wear of long sleeves/pants/socks/gloves and handkerchief around neck/mouth/face and use of poison ivy block cream along with tepid shower immediately after completion of yard work/playing in yard.  Keep poison ivy block lotion and soap at home as exposure likely to occur again.   Avoid scratching lesions to prevent secondary infections.  If red steaks/fever greater than 100.5/purulent discharge follow up for re-evaluation as may need antibiotics.  May apply ice to itchy areas if po/topical meds not yet active systemically or wearing off prior to next dose.  Exitcare handout on contact dermatitis  poison ivy given to patient.  Patient verbalized agreement and understanding of treatment plan and had no further questions at this time.   P2:  Avoidance and hand washing.  Patient may use normal saline nasal spray as needed.  Consider zyrtec '10mg'$  po daily as claritin still having post nasal drip or add nasal steroid use e.g. flonase or nasacort 1 spray each nostril BID.  Avoid triggers if possible.  Shower prior to bedtime if exposed to triggers.  If allergic dust/dust mites recommend mattress/pillow covers/encasements; washing linens, vacuuming, sweeping, dusting weekly.  Call or return to clinic as needed if these symptoms worsen or fail to improve as anticipated.   Patient verbalized understanding of instructions, agreed with plan of care and had no further questions at this time.  P2:  Avoidance and hand washing.  Supportive treatment.   No evidence of invasive bacterial infection, non toxic and well hydrated.  This is most likely self limiting viral infection.  I do not see where any further testing or imaging is necessary at this time.   I will suggest supportive care, rest, good hygiene and encourage the patient to take adequate fluids.  The patient is to return to clinic or EMERGENCY ROOM if symptoms worsen or change significantly e.g. ear pain, fever, purulent discharge from ears or bleeding.  Exitcare handout on otitis media with effusion given to patient.  Patient verbalized agreement and understanding of treatment plan.     Olen Cordial, NP 06/15/16 [phone]

## 2017-04-06 ENCOUNTER — Other Ambulatory Visit: Payer: Self-pay | Admitting: Family Medicine

## 2017-04-06 DIAGNOSIS — Z1231 Encounter for screening mammogram for malignant neoplasm of breast: Secondary | ICD-10-CM

## 2017-04-21 ENCOUNTER — Ambulatory Visit
Admission: RE | Admit: 2017-04-21 | Discharge: 2017-04-21 | Disposition: A | Discharge status: Home / Self Care | Payer: Medicare HMO | Source: Ambulatory Visit | Attending: Family Medicine | Admitting: Family Medicine

## 2017-04-21 DIAGNOSIS — Z1231 Encounter for screening mammogram for malignant neoplasm of breast: Secondary | ICD-10-CM | POA: Insufficient documentation

## 2018-03-22 ENCOUNTER — Other Ambulatory Visit: Payer: Self-pay | Admitting: Family Medicine

## 2018-03-22 DIAGNOSIS — Z1231 Encounter for screening mammogram for malignant neoplasm of breast: Secondary | ICD-10-CM

## 2018-04-22 ENCOUNTER — Ambulatory Visit
Admission: RE | Admit: 2018-04-22 | Discharge: 2018-04-22 | Disposition: A | Discharge status: Home / Self Care | Payer: Medicare HMO | Source: Ambulatory Visit | Attending: Family Medicine | Admitting: Family Medicine

## 2018-04-22 DIAGNOSIS — Z1231 Encounter for screening mammogram for malignant neoplasm of breast: Secondary | ICD-10-CM | POA: Insufficient documentation

## 2019-04-04 ENCOUNTER — Other Ambulatory Visit: Payer: Self-pay | Admitting: Family Medicine

## 2019-04-04 DIAGNOSIS — Z1231 Encounter for screening mammogram for malignant neoplasm of breast: Secondary | ICD-10-CM

## 2019-07-07 ENCOUNTER — Ambulatory Visit
Admission: RE | Admit: 2019-07-07 | Discharge: 2019-07-07 | Disposition: A | Discharge status: Home / Self Care | Payer: Medicare HMO | Source: Ambulatory Visit | Attending: Family Medicine | Admitting: Family Medicine

## 2019-07-07 DIAGNOSIS — Z1231 Encounter for screening mammogram for malignant neoplasm of breast: Secondary | ICD-10-CM | POA: Insufficient documentation

## 2019-07-11 ENCOUNTER — Other Ambulatory Visit: Payer: Self-pay | Admitting: Family Medicine

## 2019-07-11 DIAGNOSIS — N6310 Unspecified lump in the right breast, unspecified quadrant: Secondary | ICD-10-CM

## 2019-07-11 DIAGNOSIS — R928 Other abnormal and inconclusive findings on diagnostic imaging of breast: Secondary | ICD-10-CM

## 2019-07-20 ENCOUNTER — Ambulatory Visit
Admission: RE | Admit: 2019-07-20 | Discharge: 2019-07-20 | Disposition: A | Discharge status: Home / Self Care | Payer: Medicare HMO | Source: Ambulatory Visit | Attending: Family Medicine | Admitting: Family Medicine

## 2019-07-20 DIAGNOSIS — R928 Other abnormal and inconclusive findings on diagnostic imaging of breast: Secondary | ICD-10-CM | POA: Diagnosis present

## 2019-07-20 DIAGNOSIS — N6310 Unspecified lump in the right breast, unspecified quadrant: Secondary | ICD-10-CM

## 2019-08-22 ENCOUNTER — Other Ambulatory Visit: Payer: Self-pay

## 2019-08-22 ENCOUNTER — Ambulatory Visit: Payer: Medicare HMO | Attending: Family Medicine | Admitting: Physical Therapy

## 2019-08-22 ENCOUNTER — Encounter: Payer: Self-pay | Admitting: Physical Therapy

## 2019-08-22 DIAGNOSIS — M6281 Muscle weakness (generalized): Secondary | ICD-10-CM | POA: Insufficient documentation

## 2019-08-22 DIAGNOSIS — G8929 Other chronic pain: Secondary | ICD-10-CM | POA: Diagnosis present

## 2019-08-22 DIAGNOSIS — M542 Cervicalgia: Secondary | ICD-10-CM | POA: Diagnosis present

## 2019-08-22 DIAGNOSIS — M25511 Pain in right shoulder: Secondary | ICD-10-CM | POA: Diagnosis present

## 2019-08-22 NOTE — Therapy (Signed)
[facility] [address], [place], [address] Phone: [phone]   Fax:  [phone]  Physical Therapy Evaluation  Patient Details  Name: Cheryl Schroeder MRN: [id number] Date of Birth: 01-03-1956 Referring Provider (PT): Lynnell Jude, MD   Encounter Date: 08/22/2019  PT End of Session - 08/23/19 0919    Visit Number  1    Number of Visits  12    Date for PT Re-Evaluation  10/18/19    Authorization Type  [employer] reporting period from 08/22/2019    Authorization - Visit Number  1    Authorization - Number of Visits  10    PT Start Time  1700    PT Stop Time  1800    PT Time Calculation (min)  57 min    Activity Tolerance  Patient tolerated treatment well;Other (comment)   limited by anxiety and ADHD symptoms   Behavior During Therapy  Anxious       Past Medical History:  Diagnosis Date  . Bipolar 2 disorder ([place])   . Fibromyalgia   . Psoriasis     Past Surgical History:  Procedure Laterality Date  . ABDOMINAL HYSTERECTOMY    . ADENOIDECTOMY    . BREAST BIOPSY Right 1990   cyst  . TONSILLECTOMY      There were no vitals filed for this visit.   Subjective Assessment - 08/22/19 1719    Subjective  Patient reports her most recent episode of neck pain occurred the week before Halloween when she fell off of a bedside commode that she was standing on. She hit head, R shoulder, and neck on the wall and then the BSC fell on top of her. She was standing on the [facility] to reach something. She was able to get right up and continue on. She had some nausea about 10 min after the fall but then it went away in about 15-20 minutes. She did not loose consciousness. Everything hurt right away. She called her physician the next week because he already had problems from the past and wanted to make sure it was okay. She is very afraid of an MRI and doesn't want to have one. Her doctor recommended she go to physical  therapy. She had a prior similar fall in 2013 when she feel on some cement after missing a step. She falls once every several year. Since then she has had a lot of upper trap pain. This time the pain is worse over the scapula. She feels her pain is staying the same this time and limits her in how she can reach. She saw her doctor twice about this problem and has not had any other treatment. She had physical therapy in the past for fibromyalgia.    Pertinent History  Patient is a 63 y.o. female who presents to outpatient physical therapy with a referral for medical diagnosis cervicalgia. This patient's chief complaints consist of right-sided neck, shoulder, and arm pain , leading to the following functional deficits: difficulty reaching, lifting, cleaning house/vaccuming, decreased quality of life. Relevant past medical history and comorbidities include anxiety, major depression (currently managed with medication to patient's satisfaction by psychiatrist), fibromyalgia, ADHD, psoriasis, plantar fasciitis, chronic back pain, chronic iritis (currently being managed).  States that her psychiatrists believes she has major depression instead of bipolar 2    Limitations  Other (comment);House hold activities;Standing;Walking   reaching Back to the Right a certain way (back seat in the  car).   Diagnostic tests  No recent imaging of the spine    Patient Stated Goals  Wants to strengthen the muscles around her R upper trap.    Currently in Pain?  Yes    Pain Score  3    worst: 8/10; best 2/10   Pain Location  Neck   neck to proximal R forearm, down R scapula   Pain Orientation  Right    Pain Descriptors / Indicators  Sharp;Aching   debilitating   Pain Type  Chronic pain    Pain Radiating Towards  denies numbness or tingling    Pain Onset  More than a month ago    Pain Frequency  Intermittent    Aggravating Factors   reaching backwards a certain way, using R hand such as wiping    Pain Relieving Factors   ice, heat    Effect of Pain on Daily Activities  makes her not want to do anything, makes her feel yucky. difficulty reaching, lifting, cleaning house/vaccuming, decreased quality of life.         [facility] PT Assessment - 08/23/19 0001      Assessment   Medical Diagnosis  cervicalgia    Referring Provider (PT)  Lynnell Jude, MD    Onset Date/Surgical Date  06/30/19    Hand Dominance  Left   left for writing, right for everything else   Next MD Visit  none scheduled    Prior Therapy  none for this problem prior to this episode of care.       Precautions   Precautions  None      Restrictions   Weight Bearing Restrictions  No      Balance Screen   Has the patient fallen in the past 6 months  Yes    How many times?  1    Has the patient had a decrease in activity level because of a fear of falling?   No    Is the patient reluctant to leave their home because of a fear of falling?   No      Home Environment   Living Environment  Private residence    Living Arrangements  Parent    Type of [place] to enter    Entrance Stairs-Number of Steps  2    [place]  One level    Home Equipment  --   mother has equipment and is quite independent.     Prior Function   Level of Independence  Independent    Vocation  On disability   Nurse   Leisure  reading, being outside, planting flowers, be active       OBJECTIVE  OUTCOME MEASURES Patient Specific Functional Scale (rated 0 = unable to perform, 10 = able to perform without limitations) 1. Lifting: 6-8/10 2. Vacuuming/cleaning house: 4/10 3. Prolonged standing: 4-5/10  (maybe 30 minutes, then back starts hurting).  Average: 5.7  OBSERVATION/INSPECTION . Posture: forward head, rounded shoulders, slumped in sitting.  Marland Kitchen Posture correction: hard to hold but feels good . Tremor: none . Muscle bulk: minimal muscle mass noted at bilateral shoulders and throughout body.  Relative hypertrophy noted at L upper trap compared to R. Significant asymmetry in SCM, upper trap, and levator scap region with R side significantly less bulky than the left.  . Skin: WNL  . Gait: grossly WFL for household and short community  ambulation. More detailed gait analysis deferred to later date as needed.   NEUROLOGICAL Dermatomes . C2-T1 appears equal and intact to light touch.  Myotomes . C2-T1 appears equal and intact  SPINE MOTION Cervical Spine AROM *Indicates pain Flexion: = 65 Extension: = 45, end range lower c-spine pain Rotation: R= 48, L = 38. Both painful at R neck.  Side Flexion: R= 25 mild pain R neck, L = 20 lots of pain R neck. Very Cautious and slow moving.    PERIPHERAL JOINT MOTION (in degrees) Comments: B shoulders WFL AROM and PROM except patient moves very slowly with reports of fear of provoking pain.    MUSCLE PERFORMANCE (MMT):  *Indicates pain 08/22/2019 Date Date  Joint/Motion R/L R/L R/L  Shoulder     Flexion 4*/5 / /  Extension 5/5 / /  Abduction 4*/5 / /  External rotation 4+/4+ / /  Internal rotation 4+*/5 / /  Elbow     Flexion 5/5 / /  Extension 5/5 / /  Hand     Grip WFL L>R / /  Comments: sudden concordant pain with IR, fearful of extension on R.   REPEATED MOTION - repeated cervical retraction in seated position: during patient reports it feels good, no better following.   ACCESSORY MOTION:  - Hypomobile to CPA in supine throughout cervical spine. Varying levels of tenderness noted over same region. R UPA tender throughout cervical spine.   PALPATION: - Very TTP at various places over R neck musculature and R shoulder. Epicenter of chief complaint very TTP over soft tissue at superior angle of R scapula. Also TTP over posterior proximal humerus. Diffuse tenderness throughout R cervical spine, upper trap, and shoulder muscles.   FUNCTIONAL MOBILITY: - Bed mobility: WFL. - Transfers: WFL.  EDUCATION/COGNITION: Patient  is alert and oriented X 4. Patient highly anxious and required additional time for processing to feel more comfortable. Had difficulty rating her ability with PSFS and stated she felt she was being inhibited by her ADHD.    Objective measurements completed on examination: See above findings.   TREATMENT:  Therapeutic exercise: to centralize symptoms and improve ROM, strength, muscular endurance, and activity tolerance required for successful completion of functional activities.  - seated repeated cervical retraction x 10 - R shoulder AAROM wall slides x 5 in flexion and x 5 in abduction.  Cuing for form and technique.  - Exercise purpose/form. Self management techniques. Education on diagnosis, prognosis, POC, anatomy and physiology of current condition Education on HEP including handout   HOME EXERCISE PROGRAM Access Code: [email or web address]  URL: [email or web address]  Date: 08/22/2019  Prepared by: Rosita Kea   Exercises  Seated Cervical Retraction - 10-15 reps - 1 second hold - 4x daily - 7x weekly  Shoulder Flexion Wall Slide with Towel - 1 sets - 10 reps - 1 second hold - 2x daily - 7x weekly  Standing Shoulder Abduction Slides at Wall - 1 sets - 10 reps - 1 second hold - 2x daily - 7x weekly    PT Education - 08/23/19 0919    Education Details  Exercise purpose/form. Self management techniques. Education on diagnosis, prognosis, POC, anatomy and physiology of current condition Education on HEP including handout    Person(s) Educated  Patient    Methods  Explanation;Demonstration;Tactile cues;Verbal cues;Handout    Comprehension  Verbalized understanding;Tactile cues required;Need further instruction;Returned demonstration;Verbal cues required       PT Short Term Goals - 08/23/19 [phone]  PT SHORT TERM GOAL #1   Title  Be independent with initial home exercise program for self-management of symptoms.    Baseline  Initial HEP provided at IE (08/22/2019);    Time  3     Period  Weeks    Status  New    Target Date  09/13/19        PT Long Term Goals - 08/23/19 0927      PT LONG TERM GOAL #1   Title  Be independent with a long-term home exercise program for self-management of symptoms    Baseline  Initial HEP provided at IE (08/22/2019);    Time  8    Period  Weeks    Status  New    Target Date  10/18/19      PT LONG TERM GOAL #2   Title  Improve patient specific functional scale to equal or greater than 8/10 to demonstrate improved self-reported function.    Baseline  5.7/10 (08/22/2019);    Time  8    Status  New    Target Date  10/18/19      PT LONG TERM GOAL #3   Title  Reduce pain with functional activities to equal or less than 310 to allow patient to complete usual activities including ADLs, IADLs, and social engagement with less difficulty.    Baseline  8/10 (08/22/2019);    Time  8    Period  Weeks    Status  New    Target Date  10/18/19      PT LONG TERM GOAL #4   Title  Patient will demonstrate equal or greater than 60 degrees cervical spine rotation each way to improve ability to check blind spot while driving.    Baseline  Rotation: R= 48, L = 38. Both painful at R neck.  (08/22/2019);    Time  8    Period  Weeks    Status  New    Target Date  10/18/19      PT LONG TERM GOAL #5   Title  Complete community, work and/or recreational activities without limitation due to current condition    Baseline  limited in lifting, reaching, housecleaning/vaccuming, quality of life (08/22/2019);    Time  8    Period  Weeks    Status  New    Target Date  10/18/19             Plan - 08/23/19 0920    Clinical Impression Statement  Patient is a 63 y.o. female referred to outpatient physical therapy with a medical diagnosis of cervicalgia who presents with signs and symptoms consistent with acute on chronic episode of right sided neck, shoulder, and arm pain following fall on 06/30/2019. Patient presents with significant pain, fear  avoidance, decreased activity tolerance, muscle performance (strength/endurance/power), joint stiffness, muscle spasm impairments that are limiting ability to complete her usual activities in including reaching, cleaning, lifting, vaccuming without difficulty and it is negatively impacting her quality of life. Patient will benefit from skilled physical therapy intervention to address current body structure impairments and activity limitations to improve function and work towards goals set in current POC in order to return to prior level of function or maximal functional improvement.    Personal Factors and Comorbidities  Age;Comorbidity 3+;Behavior Pattern;Past/Current Experience;Fitness;Time since onset of injury/illness/exacerbation;Profession    Comorbidities  anxiety, major depression (currently managed with medication to patient's satisfaction by psychiatrist), fibromyalgia, ADHD, psoriasis, plantar fasciitis, chronic back pain, chronic iritis (  currently being managed).    Examination-Activity Limitations  Carry;Reach Overhead;Other;Lift;Hygiene/Grooming;Dressing;Caring for Others;Bathing   reaching   Examination-Participation Restrictions  Cleaning;Community Activity;Driving;Interpersonal Relationship;Other   vaccuming   Stability/Clinical Decision Making  Evolving/Moderate complexity    Clinical Decision Making  Moderate    Rehab Potential  Fair    PT Frequency  2x / week    PT Duration  8 weeks    PT Treatment/Interventions  ADLs/Self Care Home Management;Aquatic Therapy;Cryotherapy;Moist Heat;Therapeutic activities;Therapeutic exercise;Neuromuscular re-education;Cognitive remediation;Patient/family education;Manual techniques;Passive range of motion;Dry needling;Spinal Manipulations;Joint Manipulations    PT Next Visit Plan  continue with strengthening and stretching as tolerated    PT Home Exercise Plan  [place] Access Code: [email or web address]    Recommended Other Services  Continue working to  manage mental health impairments with treatment team    Consulted and Agree with Plan of Care  Patient       Patient will benefit from skilled therapeutic intervention in order to improve the following deficits and impairments:  Decreased endurance, Decreased mobility, Hypomobility, Increased muscle spasms, Improper body mechanics, Impaired perceived functional ability, Decreased range of motion, Decreased activity tolerance, Decreased strength, Impaired flexibility, Impaired UE functional use, Pain  Visit Diagnosis: Cervicalgia  Chronic right shoulder pain  Muscle weakness (generalized)     Problem List Patient Active Problem List   Diagnosis Date Noted  . Plantar fasciitis 07/09/2014    Everlean Alstrom. Graylon Good, PT, DPT 08/23/19, 9:33 AM  [facility] [address] [place], [address] Phone: [phone]   Fax:  [phone]  Name: Cheryl Schroeder MRN: [id number] Date of Birth: 1956/09/06

## 2019-08-31 ENCOUNTER — Encounter: Payer: Medicare HMO | Admitting: Physical Therapy

## 2019-08-31 ENCOUNTER — Ambulatory Visit
Admission: EM | Admit: 2019-08-31 | Discharge: 2019-08-31 | Disposition: A | Discharge status: Home / Self Care | Payer: Medicare HMO | Attending: Family Medicine | Admitting: Family Medicine

## 2019-08-31 ENCOUNTER — Other Ambulatory Visit: Payer: Self-pay

## 2019-08-31 DIAGNOSIS — J029 Acute pharyngitis, unspecified: Secondary | ICD-10-CM

## 2019-08-31 LAB — RAPID STREP SCREEN (MED CTR MEBANE ONLY): Streptococcus, Group A Screen (Direct): NEGATIVE

## 2019-08-31 NOTE — ED Triage Notes (Signed)
Patient states that she has been having a sore throat x 3-4 days with worsening symptoms today, has noticed some redness and ear pain with it.

## 2019-08-31 NOTE — Discharge Instructions (Signed)
Rest, fluids, tylenol/ibuprofen as needed

## 2019-08-31 NOTE — ED Provider Notes (Signed)
[facility]    CSN: [id number] Arrival date & time: 08/31/19  1458      History   Chief Complaint Chief Complaint  Patient presents with  . Sore Throat    HPI Cheryl Schroeder is a 63 y.o. female.   63 yo female with a c/o sore throat for the past 3-4 days and left ear pain today. Denies any fevers, chills, cough, shortness of breath, body aches, runny nose, nasal congestion. No known sick contacts.    Sore Throat    Past Medical History:  Diagnosis Date  . Bipolar 2 disorder ([place])   . Fibromyalgia   . Psoriasis     Patient Active Problem List   Diagnosis Date Noted  . Plantar fasciitis 07/09/2014    Past Surgical History:  Procedure Laterality Date  . ABDOMINAL HYSTERECTOMY    . ADENOIDECTOMY    . BREAST BIOPSY Right 1990   cyst  . TONSILLECTOMY      OB History   No obstetric history on file.      Home Medications    Prior to Admission medications   Medication Sig Start Date End Date Taking? Authorizing Provider  Calcium Carbonate-Vitamin D (CALCIUM + D PO) Take by mouth daily.   Yes [provider]  cetirizine (ZYRTEC) 10 MG tablet Take 1 tablet (10 mg total) by mouth 2 (two) times daily. 06/14/16 08/31/19 Yes Betancourt, Aura Fey, NP  FLUoxetine (PROZAC) 40 MG capsule Take 40 mg by mouth daily.   Yes [provider]  gabapentin (NEURONTIN) 300 MG capsule Take 300 mg by mouth 3 (three) times daily.   Yes [provider]  HYDROcodone-acetaminophen (NORCO/VICODIN) 5-325 MG per tablet Take 1 tablet by mouth as needed for moderate pain.   Yes [provider]  lamoTRIgine (LAMICTAL) 200 MG tablet  04/27/14  Yes [provider]  pravastatin (PRAVACHOL) 40 MG tablet Take 40 mg by mouth daily.   Yes [provider]    Family History Family History  Problem Relation Age of Onset  . Breast cancer Maternal Aunt 72  . Lung cancer Father     Social History Social History   Tobacco Use  .  Smoking status: Never Smoker  . Smokeless tobacco: Never Used  Substance Use Topics  . Alcohol use: No  . Drug use: No     Allergies   Patient has no known allergies.   Review of Systems Review of Systems   Physical Exam Triage Vital Signs ED Triage Vitals  Enc Vitals Group     BP 08/31/19 1512 103/71     Pulse Rate 08/31/19 1512 75     Resp 08/31/19 1512 17     Temp 08/31/19 1512 98.1 F (36.7 C)     Temp Source 08/31/19 1512 Oral     SpO2 08/31/19 1512 100 %     Weight 08/31/19 1509 115 lb (52.2 kg)     Height 08/31/19 1509 5' 5.5" (1.664 m)     Head Circumference --      Peak Flow --      Pain Score 08/31/19 1509 6     Pain Loc --      Pain Edu? --      Excl. in [place]? --    No data found.  Updated Vital Signs BP 103/71 (BP Location: Left Arm)   Pulse 75   Temp 98.1 F (36.7 C) (Oral)   Resp 17   Ht 5' 5.5" (1.664  m)   Wt 52.2 kg   SpO2 100%   BMI 18.85 kg/m   Visual Acuity Right Eye Distance:   Left Eye Distance:   Bilateral Distance:    Right Eye Near:   Left Eye Near:    Bilateral Near:     Physical Exam Constitutional:      General: She is not in acute distress.    Appearance: She is not toxic-appearing or diaphoretic.  HENT:     Nose: No congestion or rhinorrhea.     Mouth/Throat:     Pharynx: Posterior oropharyngeal erythema present. No oropharyngeal exudate.  Cardiovascular:     Rate and Rhythm: Normal rate.  Pulmonary:     Effort: Pulmonary effort is normal. No respiratory distress.  Neurological:     Mental Status: She is alert.      UC Treatments / Results  Labs (all labs ordered are listed, but only abnormal results are displayed) Labs Reviewed  RAPID STREP SCREEN ([facility] ONLY)  CULTURE, GROUP A STREP ([place])  NOVEL CORONAVIRUS, NAA (HOSP ORDER, SEND-OUT TO REF LAB; TAT 18-24 HRS)    EKG   Radiology No results found.  Procedures Procedures (including critical care time)  Medications Ordered in UC  Medications - No data to display  Initial Impression / Assessment and Plan / UC Course  I have reviewed the triage vital signs and the nursing notes.  Pertinent labs & imaging results that were available during my care of the patient were reviewed by me and considered in my medical decision making (see chart for details).      Final Clinical Impressions(s) / UC Diagnoses   Final diagnoses:  Viral pharyngitis     Discharge Instructions     Rest, fluids, tylenol/ibuprofen as needed    ED Prescriptions    None      1. Lab results and diagnosis reviewed with patient 2. Recommend supportive treatment as above 3. covid test done  4. Follow-up prn if symptoms worsen or don't improve  PDMP not reviewed this encounter.   Norval Gable, MD 08/31/19 [phone]

## 2019-09-01 LAB — NOVEL CORONAVIRUS, NAA (HOSP ORDER, SEND-OUT TO REF LAB; TAT 18-24 HRS): SARS-CoV-2, NAA: NOT DETECTED

## 2019-09-03 LAB — CULTURE, GROUP A STREP (THRC)

## 2019-09-05 ENCOUNTER — Ambulatory Visit: Payer: Medicare HMO | Admitting: Physical Therapy

## 2019-09-05 ENCOUNTER — Other Ambulatory Visit: Payer: Self-pay

## 2019-09-05 ENCOUNTER — Encounter: Payer: Self-pay | Admitting: Physical Therapy

## 2019-09-05 DIAGNOSIS — G8929 Other chronic pain: Secondary | ICD-10-CM

## 2019-09-05 DIAGNOSIS — M6281 Muscle weakness (generalized): Secondary | ICD-10-CM

## 2019-09-05 DIAGNOSIS — M542 Cervicalgia: Secondary | ICD-10-CM

## 2019-09-05 NOTE — Therapy (Signed)
[facility] [address] [place], [address] Phone: [phone]   Fax:  [phone]  Physical Therapy Treatment  Patient Details  Name: Cheryl Schroeder MRN: [id number] Date of Birth: January 09, 1956 Referring Provider (PT): Lynnell Jude, MD   Encounter Date: 09/05/2019  PT End of Session - 09/05/19 2113    Visit Number  2    Number of Visits  12    Date for PT Re-Evaluation  10/18/19    Authorization Type  [employer] Medicare reporting period from 08/22/2019    Authorization - Visit Number  2    Authorization - Number of Visits  10    PT Start Time  1040    PT Stop Time  1120    PT Time Calculation (min)  40 min    Activity Tolerance  Patient tolerated treatment well    Behavior During Therapy  [facility] for tasks assessed/performed       Past Medical History:  Diagnosis Date  . Bipolar 2 disorder ([place])   . Fibromyalgia   . Psoriasis     Past Surgical History:  Procedure Laterality Date  . ABDOMINAL HYSTERECTOMY    . ADENOIDECTOMY    . BREAST BIOPSY Right 1990   cyst  . TONSILLECTOMY      There were no vitals filed for this visit.  Subjective Assessment - 09/05/19 1042    Subjective  Patient reports she has 6/10 pain releated to fibromyalgia and her presenting condition. She describes her pain over her bilateral upper traps, R > L. She was really sore and tired the day following her initial eval but it didn't stop her from doing anything. She states it is probably feeling a little better than last session. She has been doing her HEP except last week when she was sick.    Pertinent History  Patient is a 63 y.o. female who presents to outpatient physical therapy with a referral for medical diagnosis cervicalgia. This patient's chief complaints consist of right-sided neck, shoulder, and arm pain , leading to the following functional deficits: difficulty reaching, lifting, cleaning house/vaccuming, decreased  quality of life. Relevant past medical history and comorbidities include anxiety, major depression (currently managed with medication to patient's satisfaction by psychiatrist), fibromyalgia, ADHD, psoriasis, plantar fasciitis, chronic back pain, chronic iritis (currently being managed).  States that her psychiatrists believes she has major depression instead of bipolar 2    Limitations  Other (comment);House hold activities;Standing;Walking   reaching Back to the Right a certain way (back seat in the car).   Diagnostic tests  No recent imaging of the spine    Patient Stated Goals  Wants to strengthen the muscles around her R upper trap.    Currently in Pain?  Yes    Pain Score  6     Pain Onset  More than a month ago         TREATMENT:  Therapeutic exercise:to centralize symptoms and improve ROM, strength, muscular endurance, and activity tolerance required for successful completion of functional activities.  - seated repeated cervical retraction x 10 - R shoulder AAROM wall slides x 10 R side in flexion and x 10 R side in abduction.  - Standing rows with scapular retraction for improved postural and shoulder girdle strengthening and mobility. Required instruction for technique and cuing to retract, posteriorly tilt, and depress scapulae. Red theraband x 10, yellow theraband x 10. Patient with great difficulty relaxing neck and upper trap  muscles.   - supine horizontal abduction with yellow theraband 3x10 with cuing to depress and retract scapulae.  - supine D2 shoulder flexion using yellow theraband, 2x10 each side. Cuing to depress and retract scapulae while relaxing neck.   Cuing for form and technique.   - Exercise purpose/form. Self management techniques. updated HEP including handout  Manual therapy: to reduce pain and tissue tension, improve range of motion, neuromodulation, in order to promote improved ability to complete functional activities. - STM to posterior cervical  musculature focusing on bilateral UT, LS, and SCM, R >L. Noted for elevated tension in these regions. Patient reported it relieved some tension.   HOME EXERCISE PROGRAM Access Code: [email or web address]  URL: [email or web address]  Date: 09/05/2019  Prepared by: Rosita Kea   Exercises Seated Cervical Retraction - 10-15 reps - 1 second hold - 2x daily - 7x weekly Shoulder Flexion Wall Slide with Towel - 2 sets - 10 reps - 1 second hold - 2x daily - 7x weekly Standing Shoulder Abduction Slides at Wall - 2 sets - 10 reps - 1 second hold - 2x daily - 7x weekly Supine Shoulder Horizontal Abduction with Resistance - 3 sets - 10 reps - 1 second hold - 2x daily - 7x weekly Supine PNF D2 Flexion with Resistance - 2-3 sets - 10 reps - 1 second hold - 2x daily - 7x weekly   PT Education - 09/05/19 2112    Education Details  Exercise purpose/form. Self management techniques. updated HEP    Person(s) Educated  Patient    Methods  Explanation;Demonstration;Tactile cues;Verbal cues;Handout    Comprehension  Verbalized understanding;Verbal cues required;Tactile cues required;Need further instruction;Returned demonstration       PT Short Term Goals - 08/23/19 0926      PT SHORT TERM GOAL #1   Title  Be independent with initial home exercise program for self-management of symptoms.    Baseline  Initial HEP provided at IE (08/22/2019);    Time  3    Period  Weeks    Status  New    Target Date  09/13/19        PT Long Term Goals - 08/23/19 0927      PT LONG TERM GOAL #1   Title  Be independent with a long-term home exercise program for self-management of symptoms    Baseline  Initial HEP provided at IE (08/22/2019);    Time  8    Period  Weeks    Status  New    Target Date  10/18/19      PT LONG TERM GOAL #2   Title  Improve patient specific functional scale to equal or greater than 8/10 to demonstrate improved self-reported function.    Baseline  5.7/10 (08/22/2019);    Time  8     Status  New    Target Date  10/18/19      PT LONG TERM GOAL #3   Title  Reduce pain with functional activities to equal or less than 310 to allow patient to complete usual activities including ADLs, IADLs, and social engagement with less difficulty.    Baseline  8/10 (08/22/2019);    Time  8    Period  Weeks    Status  New    Target Date  10/18/19      PT LONG TERM GOAL #4   Title  Patient will demonstrate equal or greater than 60 degrees cervical spine rotation each way to improve ability to check blind  spot while driving.    Baseline  Rotation: R= 48, L = 38. Both painful at R neck.  (08/22/2019);    Time  8    Period  Weeks    Status  New    Target Date  10/18/19      PT LONG TERM GOAL #5   Title  Complete community, work and/or recreational activities without limitation due to current condition    Baseline  limited in lifting, reaching, housecleaning/vaccuming, quality of life (08/22/2019);    Time  8    Period  Weeks    Status  New    Target Date  10/18/19            Plan - 09/05/19 2118    Clinical Impression Statement  Patient tolerated treatment well and is making progress towards goals at this point. Patient puts great effort into completing exercises properly but has difficulty relaxing neck and throat muscles during UE exercises in standing and shows global overactivation of large muscle groups surrounding the neck and upper traps. Improved with multimodal cuing. Able to demo acceptable form on supine exercises to add to HEP but not standing rows. Patient would benefit from continued physical therapy to address remaining impairments and functional limitations to work towards stated goals and return to PLOF or maximal functional independence.    Personal Factors and Comorbidities  Age;Comorbidity 3+;Behavior Pattern;Past/Current Experience;Fitness;Time since onset of injury/illness/exacerbation;Profession    Comorbidities  anxiety, major depression (currently managed  with medication to patient's satisfaction by psychiatrist), fibromyalgia, ADHD, psoriasis, plantar fasciitis, chronic back pain, chronic iritis (currently being managed).    Examination-Activity Limitations  Carry;Reach Overhead;Other;Lift;Hygiene/Grooming;Dressing;Caring for Others;Bathing   reaching   Examination-Participation Restrictions  Cleaning;Community Activity;Driving;Interpersonal Relationship;Other   vaccuming   Stability/Clinical Decision Making  Evolving/Moderate complexity    Rehab Potential  Fair    PT Frequency  2x / week    PT Duration  8 weeks    PT Treatment/Interventions  ADLs/Self Care Home Management;Aquatic Therapy;Cryotherapy;Moist Heat;Therapeutic activities;Therapeutic exercise;Neuromuscular re-education;Cognitive remediation;Patient/family education;Manual techniques;Passive range of motion;Dry needling;Spinal Manipulations;Joint Manipulations    PT Next Visit Plan  continue with strengthening and stretching as tolerated    PT Home Exercise Plan  [employer] Access Code: [email or web address]    Consulted and Agree with Plan of Care  Patient       Patient will benefit from skilled therapeutic intervention in order to improve the following deficits and impairments:  Decreased endurance, Decreased mobility, Hypomobility, Increased muscle spasms, Improper body mechanics, Impaired perceived functional ability, Decreased range of motion, Decreased activity tolerance, Decreased strength, Impaired flexibility, Impaired UE functional use, Pain  Visit Diagnosis: Cervicalgia  Chronic right shoulder pain  Muscle weakness (generalized)     Problem List Patient Active Problem List   Diagnosis Date Noted  . Plantar fasciitis 07/09/2014    Everlean Alstrom. Graylon Good, PT, DPT 09/05/19, 9:20 PM  [facility] [address] [place], [address] Phone: [phone]   Fax:  [phone]  Name: Cheryl Schroeder MRN:  [id number] Date of Birth: Jul 14, 1956

## 2019-09-07 ENCOUNTER — Other Ambulatory Visit: Payer: Self-pay

## 2019-09-07 ENCOUNTER — Encounter: Payer: Self-pay | Admitting: Physical Therapy

## 2019-09-07 ENCOUNTER — Ambulatory Visit: Payer: Medicare HMO | Attending: Family Medicine | Admitting: Physical Therapy

## 2019-09-07 DIAGNOSIS — M25511 Pain in right shoulder: Secondary | ICD-10-CM | POA: Diagnosis present

## 2019-09-07 DIAGNOSIS — G8929 Other chronic pain: Secondary | ICD-10-CM | POA: Insufficient documentation

## 2019-09-07 DIAGNOSIS — M542 Cervicalgia: Secondary | ICD-10-CM | POA: Insufficient documentation

## 2019-09-07 DIAGNOSIS — M6281 Muscle weakness (generalized): Secondary | ICD-10-CM | POA: Diagnosis present

## 2019-09-07 NOTE — Therapy (Signed)
[facility] [address] [place], [address] Phone: [phone]   Fax:  [phone]  Physical Therapy Treatment  Patient Details  Name: Cheryl Schroeder MRN: [id number] Date of Birth: [birth year] Referring Provider (PT): Lynnell Jude, MD   Encounter Date: 09/07/2019  PT End of Session - 09/07/19 1439    Visit Number  3    Number of Visits  12    Date for PT Re-Evaluation  10/18/19    Authorization Type  [employer] Medicare reporting period from 08/22/2019    Authorization - Visit Number  3    Authorization - Number of Visits  10    PT Start Time  5102    PT Stop Time  1515    PT Time Calculation (min)  40 min    Activity Tolerance  Patient tolerated treatment well    Behavior During Therapy  [facility] for tasks assessed/performed       Past Medical History:  Diagnosis Date  . Bipolar 2 disorder ([place])   . Fibromyalgia   . Psoriasis     Past Surgical History:  Procedure Laterality Date  . ABDOMINAL HYSTERECTOMY    . ADENOIDECTOMY    . BREAST BIOPSY Right 1990   cyst  . TONSILLECTOMY      There were no vitals filed for this visit.  Subjective Assessment - 09/07/19 1437    Subjective  Patient reports she continues to have similar 6/10 pain in the upper trap and R shoulder/arm region upon arrival. She did not get to do her HEP yesterday due to needing to help her mother in [place]. States she was sore following last treatment session but no worse than pain she deals with every day.    Pertinent History  Patient is a 63 y.o. female who presents to outpatient physical therapy with a referral for medical diagnosis cervicalgia. This patient's chief complaints consist of right-sided neck, shoulder, and arm pain , leading to the following functional deficits: difficulty reaching, lifting, cleaning house/vaccuming, decreased quality of life. Relevant past medical history and comorbidities include anxiety, major  depression (currently managed with medication to patient's satisfaction by psychiatrist), fibromyalgia, ADHD, psoriasis, plantar fasciitis, chronic back pain, chronic iritis (currently being managed).  States that her psychiatrists believes she has major depression instead of bipolar 2    Limitations  Other (comment);House hold activities;Standing;Walking   reaching Back to the Right a certain way (back seat in the car).   Diagnostic tests  No recent imaging of the spine    Patient Stated Goals  Wants to strengthen the muscles around her R upper trap.    Currently in Pain?  Yes    Pain Score  6     Pain Onset  More than a month ago         TREATMENT: Therapeutic exercise:to centralize symptoms and improve ROM, strength, muscular endurance, and activity tolerance required for successful completion of functional activities.  - Upper body ergometer with no added resistance encourage joint nutrition, warm tissue, induce analgesic effect of aerobic exercise, improve muscular strength and endurance,  and prepare for remainder of session. X 5 minutes during subjective exam.   - Standing rows with scapular retraction for improved postural and shoulder girdle strengthening and mobility. Required instruction for technique and cuing to retract, posteriorly tilt, and depress scapulae. 5# cable x30. Patient with difficulty relaxing neck and upper trap muscles but improved from last session and improved with cuing.   -  seated lat pull position scapular elevation/depression x 30 with 15# cable bar. With cuing for motor control and technique. To improve postural control and decrease overactive B UT.  - seated lat pull with isolated scapular elevation/depression x 10 with 20#. With cuing for motor control and technique. To improve postural control and decrease overactive B UT.  - supine horizontal abduction with yellow theraband 2x10 with cuing to depress and retract scapulae.  - supine D2 shoulder flexion  using yellow theraband, 2x10 each side. Cuing to depress and retract scapulae while relaxing neck.   Cuing for form and technique, motor control.   -Exercise purpose/form. Self management techniques. updated HEP including handout  Manual therapy: to reduce pain and tissue tension, improve range of motion, neuromodulation, in order to promote improved ability to complete functional activities. - STM to posterior cervical musculature focusing on bilateral UT, LS, R >L. Noted for elevated tension in these regions. Patient reported it relieved some tension.   HOME EXERCISE PROGRAM Access Code: [email or web address]      URL: [email or web address]    Date: 09/05/2019  Prepared by: Rosita Kea   Exercises Seated Cervical Retraction - 10-15 reps - 1 second hold - 2x daily - 7x weekly Shoulder Flexion Wall Slide with Towel - 2 sets - 10 reps - 1 second hold - 2x daily - 7x weekly Standing Shoulder Abduction Slides at Wall - 2 sets - 10 reps - 1 second hold - 2x daily - 7x weekly Supine Shoulder Horizontal Abduction with Resistance - 3 sets - 10 reps - 1 second hold - 2x daily - 7x weekly Supine PNF D2 Flexion with Resistance - 2-3 sets - 10 reps - 1 second hold - 2x daily - 7x weekly    PT Education - 09/07/19 1438    Education Details  Exercise purpose/form. Self management techniques    Person(s) Educated  Patient    Methods  Explanation;Demonstration;Tactile cues;Verbal cues    Comprehension  Verbalized understanding;Returned demonstration;Verbal cues required;Tactile cues required;Need further instruction       PT Short Term Goals - 09/07/19 1439      PT SHORT TERM GOAL #1   Title  Be independent with initial home exercise program for self-management of symptoms.    Baseline  Initial HEP provided at IE (08/22/2019);    Time  3    Period  Weeks    Status  Achieved    Target Date  09/13/19        PT Long Term Goals - 08/23/19 0927      PT LONG TERM GOAL #1   Title   Be independent with a long-term home exercise program for self-management of symptoms    Baseline  Initial HEP provided at IE (08/22/2019);    Time  8    Period  Weeks    Status  New    Target Date  10/18/19      PT LONG TERM GOAL #2   Title  Improve patient specific functional scale to equal or greater than 8/10 to demonstrate improved self-reported function.    Baseline  5.7/10 (08/22/2019);    Time  8    Status  New    Target Date  10/18/19      PT LONG TERM GOAL #3   Title  Reduce pain with functional activities to equal or less than 310 to allow patient to complete usual activities including ADLs, IADLs, and social engagement with less difficulty.    Baseline  8/10 (08/22/2019);  Time  8    Period  Weeks    Status  New    Target Date  10/18/19      PT LONG TERM GOAL #4   Title  Patient will demonstrate equal or greater than 60 degrees cervical spine rotation each way to improve ability to check blind spot while driving.    Baseline  Rotation: R= 48, L = 38. Both painful at R neck.  (08/22/2019);    Time  8    Period  Weeks    Status  New    Target Date  10/18/19      PT LONG TERM GOAL #5   Title  Complete community, work and/or recreational activities without limitation due to current condition    Baseline  limited in lifting, reaching, housecleaning/vaccuming, quality of life (08/22/2019);    Time  8    Period  Weeks    Status  New    Target Date  10/18/19            Plan - 09/07/19 1852    Clinical Impression Statement  Patient tolerated treatment well and is making progress at this point. She was able to perform exercises with improved form and carry over from last treatment session but continued to require cuing for optimal benefit. Patient continues to show elevated tension and over activation of upper traps and anterior neck muscles with all shoulder and arm movements and would benefit from continued motor control and postural strengthening exercises.  Patient would benefit from continued physical therapy to address remaining impairments and functional limitations to work towards stated goals and return to PLOF or maximal functional independence.    Personal Factors and Comorbidities  Age;Comorbidity 3+;Behavior Pattern;Past/Current Experience;Fitness;Time since onset of injury/illness/exacerbation;Profession    Comorbidities  anxiety, major depression (currently managed with medication to patient's satisfaction by psychiatrist), fibromyalgia, ADHD, psoriasis, plantar fasciitis, chronic back pain, chronic iritis (currently being managed).    Examination-Activity Limitations  Carry;Reach Overhead;Other;Lift;Hygiene/Grooming;Dressing;Caring for Others;Bathing   reaching   Examination-Participation Restrictions  Cleaning;Community Activity;Driving;Interpersonal Relationship;Other   vaccuming   Stability/Clinical Decision Making  Evolving/Moderate complexity    Rehab Potential  Fair    PT Frequency  2x / week    PT Duration  8 weeks    PT Treatment/Interventions  ADLs/Self Care Home Management;Aquatic Therapy;Cryotherapy;Moist Heat;Therapeutic activities;Therapeutic exercise;Neuromuscular re-education;Cognitive remediation;Patient/family education;Manual techniques;Passive range of motion;Dry needling;Spinal Manipulations;Joint Manipulations    PT Next Visit Plan  continue with strengthening and stretching as tolerated    PT Home Exercise Plan  [place] Access Code: [email or web address]    Consulted and Agree with Plan of Care  Patient       Patient will benefit from skilled therapeutic intervention in order to improve the following deficits and impairments:  Decreased endurance, Decreased mobility, Hypomobility, Increased muscle spasms, Improper body mechanics, Impaired perceived functional ability, Decreased range of motion, Decreased activity tolerance, Decreased strength, Impaired flexibility, Impaired UE functional use, Pain  Visit  Diagnosis: Cervicalgia  Chronic right shoulder pain  Muscle weakness (generalized)     Problem List Patient Active Problem List   Diagnosis Date Noted  . Plantar fasciitis 07/09/2014    Everlean Alstrom. Graylon Good, PT, DPT 09/07/19, 6:53 PM  [facility] [address] [place], [address] Phone: [phone]   Fax:  [phone]  Name: Cheryl Schroeder MRN: [id number] Date of Birth: 07/09/1956

## 2019-09-12 ENCOUNTER — Ambulatory Visit: Payer: Medicare HMO | Admitting: Physical Therapy

## 2019-09-12 ENCOUNTER — Encounter: Payer: Self-pay | Admitting: Physical Therapy

## 2019-09-12 ENCOUNTER — Other Ambulatory Visit: Payer: Self-pay

## 2019-09-12 DIAGNOSIS — M542 Cervicalgia: Secondary | ICD-10-CM | POA: Diagnosis not present

## 2019-09-12 DIAGNOSIS — G8929 Other chronic pain: Secondary | ICD-10-CM

## 2019-09-12 DIAGNOSIS — M6281 Muscle weakness (generalized): Secondary | ICD-10-CM

## 2019-09-12 NOTE — Therapy (Signed)
[facility] [address] [place], [address] Phone: [phone]   Fax:  [phone]  Physical Therapy Treatment  Patient Details  Name: Cheryl Schroeder MRN: [id number] Date of Birth: 09-Jul-1956 Referring Provider (PT): Lynnell Jude, MD   Encounter Date: 09/12/2019  PT End of Session - 09/12/19 1936    Visit Number  4    Number of Visits  12    Date for PT Re-Evaluation  10/18/19    Authorization Type  [employer] Medicare reporting period from 08/22/2019    Authorization - Visit Number  4    Authorization - Number of Visits  10    PT Start Time  7591    PT Stop Time  1835    PT Time Calculation (min)  45 min    Activity Tolerance  Patient tolerated treatment well    Behavior During Therapy  [facility] for tasks assessed/performed       Past Medical History:  Diagnosis Date  . Bipolar 2 disorder ([place])   . Fibromyalgia   . Psoriasis     Past Surgical History:  Procedure Laterality Date  . ABDOMINAL HYSTERECTOMY    . ADENOIDECTOMY    . BREAST BIOPSY Right 1990   cyst  . TONSILLECTOMY      There were no vitals filed for this visit.  Subjective Assessment - 09/12/19 1751    Subjective  Patient reports she is having pain today, 6/10 upon arrival. She has started doing her exercises once a day because she feels like 2x a day makes her worse. She reports it continues to hurt all the time the same, expecially when she reaches back.    Pertinent History  Patient is a 63 y.o. female who presents to outpatient physical therapy with a referral for medical diagnosis cervicalgia. This patient's chief complaints consist of right-sided neck, shoulder, and arm pain , leading to the following functional deficits: difficulty reaching, lifting, cleaning house/vaccuming, decreased quality of life. Relevant past medical history and comorbidities include anxiety, major depression (currently managed with medication to patient's  satisfaction by psychiatrist), fibromyalgia, ADHD, psoriasis, plantar fasciitis, chronic back pain, chronic iritis (currently being managed).  States that her psychiatrists believes she has major depression instead of bipolar 2    Limitations  Other (comment);House hold activities;Standing;Walking   reaching Back to the Right a certain way (back seat in the car).   Diagnostic tests  No recent imaging of the spine    Patient Stated Goals  Wants to strengthen the muscles around her R upper trap.    Currently in Pain?  No/denies    Pain Score  6     Pain Onset  More than a month ago          TREATMENT: Therapeutic exercise:to centralize symptoms and improve ROM, strength, muscular endurance, and activity tolerance required for successful completion of functional activities.  - Upper body ergometerwith no added resistanceencourage joint nutrition, warm tissue, induce analgesic effect of aerobic exercise, improve muscular strength and endurance, and prepare for remainder of session. X 5 minutes during subjective exam.   -Standing rows with scapular retraction for improved postural and shoulder girdle strengthening and mobility. Required instruction for technique and cuing to retract, posteriorly tilt, and depress scapulae. 2x15 with yellow theraband. Patient with difficulty relaxing neck and upper trap muscles but improved from last session and improved with cuing.   (neck stabilizer - see below)  - supine horizontal abduction  with yellow theraband x20 with cuing to depress and retract scapulae.   - supine D2 shoulder flexion using yellow theraband, x20 each side. Cuing to depress and retract scapulae while relaxing neck.  Cuing for form and technique, motor control.   -Exercise purpose/form. Self management techniques.updatedHEP including handout  Neuromuscular Re-education: to improve, balance, postural strength, muscle activation patterns, and stabilization strength  required for functional activities: - neck stabilizer biofeedback, x10 at each 2 mmHg starting from 20: 22, 24, 26, 28, and 30. Repeated cuing and extended time to understand how to preferentially activate the deep neck flexors. For improved motor control of the neck and shoulder girdle.   Manual therapy:to reduce pain and tissue tension, improve range of motion, neuromodulation, in order to promote improved ability to complete functional activities. - STM to posterior cervical musculature focusing on bilateral UT, LS, R >L. Noted for elevated tension in these regions. Patient reported it relieved some tension.  HOME EXERCISE PROGRAM Access Code: [email or web address]  URL: [email or web address]  Date: 09/12/2019  Prepared by: Rosita Kea   Exercises Row with band/cable - 3 sets - 10 reps - 1 second hold - 1x daily Supine Shoulder Horizontal Abduction with Resistance - 3 sets - 10 reps - 1 second hold - 2x daily - 7x weekly Supine PNF D2 Flexion with Resistance - 2-3 sets - 10 reps - 1 second hold - 2x daily - 7x weekly    PT Education - 09/12/19 1753    Education Details  Exercise purpose/form. Self management techniques    Person(s) Educated  Patient    Methods  Explanation;Demonstration;Tactile cues;Verbal cues    Comprehension  Verbalized understanding;Returned demonstration;Verbal cues required;Tactile cues required;Need further instruction       PT Short Term Goals - 09/07/19 1439      PT SHORT TERM GOAL #1   Title  Be independent with initial home exercise program for self-management of symptoms.    Baseline  Initial HEP provided at IE (08/22/2019);    Time  3    Period  Weeks    Status  Achieved    Target Date  09/13/19        PT Long Term Goals - 08/23/19 0927      PT LONG TERM GOAL #1   Title  Be independent with a long-term home exercise program for self-management of symptoms    Baseline  Initial HEP provided at IE (08/22/2019);    Time  8    Period   Weeks    Status  New    Target Date  10/18/19      PT LONG TERM GOAL #2   Title  Improve patient specific functional scale to equal or greater than 8/10 to demonstrate improved self-reported function.    Baseline  5.7/10 (08/22/2019);    Time  8    Status  New    Target Date  10/18/19      PT LONG TERM GOAL #3   Title  Reduce pain with functional activities to equal or less than 310 to allow patient to complete usual activities including ADLs, IADLs, and social engagement with less difficulty.    Baseline  8/10 (08/22/2019);    Time  8    Period  Weeks    Status  New    Target Date  10/18/19      PT LONG TERM GOAL #4   Title  Patient will demonstrate equal or greater than 60 degrees cervical spine rotation each way to  improve ability to check blind spot while driving.    Baseline  Rotation: R= 48, L = 38. Both painful at R neck.  (08/22/2019);    Time  8    Period  Weeks    Status  New    Target Date  10/18/19      PT LONG TERM GOAL #5   Title  Complete community, work and/or recreational activities without limitation due to current condition    Baseline  limited in lifting, reaching, housecleaning/vaccuming, quality of life (08/22/2019);    Time  8    Period  Weeks    Status  New    Target Date  10/18/19            Plan - 09/12/19 1940    Clinical Impression Statement  Patient tolerated treatment well but reported similar level of pain by end of session in L lateral arm. Reports manual therapy improves comfort. Modified HEP to include more comfortable exercises and progressions and discontinue exercises patient felt were making her pain go up to need additional pain medication. Patient demonstrated good carryover from previous session but benefits from continued cuing to decrease overactivation of several muscle groups while performing simple UE movements that could be contributing to her pain. Pt required multimodal cuing for proper technique and to facilitate improved  neuromuscular control, strength, range of motion, and functional ability resulting in improved performance and form. Patient would benefit from continued physical therapy to address remaining impairments and functional limitations to work towards stated goals and return to PLOF or maximal functional independence.    Personal Factors and Comorbidities  Age;Comorbidity 3+;Behavior Pattern;Past/Current Experience;Fitness;Time since onset of injury/illness/exacerbation;Profession    Comorbidities  anxiety, major depression (currently managed with medication to patient's satisfaction by psychiatrist), fibromyalgia, ADHD, psoriasis, plantar fasciitis, chronic back pain, chronic iritis (currently being managed).    Examination-Activity Limitations  Carry;Reach Overhead;Other;Lift;Hygiene/Grooming;Dressing;Caring for Others;Bathing   reaching   Examination-Participation Restrictions  Cleaning;Community Activity;Driving;Interpersonal Relationship;Other   vaccuming   Stability/Clinical Decision Making  Evolving/Moderate complexity    Rehab Potential  Fair    PT Frequency  2x / week    PT Duration  8 weeks    PT Treatment/Interventions  ADLs/Self Care Home Management;Aquatic Therapy;Cryotherapy;Moist Heat;Therapeutic activities;Therapeutic exercise;Neuromuscular re-education;Cognitive remediation;Patient/family education;Manual techniques;Passive range of motion;Dry needling;Spinal Manipulations;Joint Manipulations    PT Next Visit Plan  continue with strengthening and stretching as tolerated    PT Home Exercise Plan  [place] Access Code: [email or web address]    Consulted and Agree with Plan of Care  Patient       Patient will benefit from skilled therapeutic intervention in order to improve the following deficits and impairments:  Decreased endurance, Decreased mobility, Hypomobility, Increased muscle spasms, Improper body mechanics, Impaired perceived functional ability, Decreased range of motion, Decreased activity  tolerance, Decreased strength, Impaired flexibility, Impaired UE functional use, Pain  Visit Diagnosis: Cervicalgia  Chronic right shoulder pain  Muscle weakness (generalized)     Problem List Patient Active Problem List   Diagnosis Date Noted  . Plantar fasciitis 07/09/2014    Everlean Alstrom. Graylon Good, PT, DPT 09/12/19, 7:41 PM  [facility] [address], [place], [address] Phone: [phone]   Fax:  [phone]  Name: IASIA FORCIER MRN: [id number] Date of Birth: 1956/01/06

## 2019-09-14 ENCOUNTER — Ambulatory Visit: Payer: Medicare HMO | Admitting: Physical Therapy

## 2019-09-14 ENCOUNTER — Encounter: Payer: Self-pay | Admitting: Physical Therapy

## 2019-09-14 ENCOUNTER — Other Ambulatory Visit: Payer: Self-pay

## 2019-09-14 DIAGNOSIS — G8929 Other chronic pain: Secondary | ICD-10-CM

## 2019-09-14 DIAGNOSIS — M542 Cervicalgia: Secondary | ICD-10-CM

## 2019-09-14 DIAGNOSIS — M25511 Pain in right shoulder: Secondary | ICD-10-CM

## 2019-09-14 DIAGNOSIS — M6281 Muscle weakness (generalized): Secondary | ICD-10-CM

## 2019-09-14 NOTE — Therapy (Signed)
[facility] [address] [place], [address] Phone: [phone]   Fax:  [phone]  Physical Therapy Treatment  Patient Details  Name: Cheryl Schroeder MRN: [id number] Date of Birth: 03-24-1956 Referring Provider (PT): Lynnell Jude, MD   Encounter Date: 09/14/2019  PT End of Session - 09/14/19 1303    Visit Number  5    Number of Visits  12    Date for PT Re-Evaluation  10/18/19    Authorization Type  [employer] Medicare reporting period from 08/22/2019    Authorization - Visit Number  5    Authorization - Number of Visits  10    PT Start Time  1300    PT Stop Time  1340    PT Time Calculation (min)  40 min    Activity Tolerance  Patient tolerated treatment well    Behavior During Therapy  [facility] for tasks assessed/performed       Past Medical History:  Diagnosis Date  . Bipolar 2 disorder ([place])   . Fibromyalgia   . Psoriasis     Past Surgical History:  Procedure Laterality Date  . ABDOMINAL HYSTERECTOMY    . ADENOIDECTOMY    . BREAST BIOPSY Right 1990   cyst  . TONSILLECTOMY      There were no vitals filed for this visit.  Subjective Assessment - 09/14/19 1259    Subjective  Patient reports she feels "aweful" today. State she had a lot of vaccuming and cleaning to do yesterday. She woke up today feeling "horrible" hurting from neck to R shoulder down arms and both hips. When she gets like this she feels nauseus. She had a coke and saltine crackers that made it feel better. She felt okay following her last treatment session. She didn't do her exercises yesterday. Reports pain is between 8-10 today.    Pertinent History  Patient is a 63 y.o. female who presents to outpatient physical therapy with a referral for medical diagnosis cervicalgia. This patient's chief complaints consist of right-sided neck, shoulder, and arm pain , leading to the following functional deficits: difficulty reaching, lifting,  cleaning house/vaccuming, decreased quality of life. Relevant past medical history and comorbidities include anxiety, major depression (currently managed with medication to patient's satisfaction by psychiatrist), fibromyalgia, ADHD, psoriasis, plantar fasciitis, chronic back pain, chronic iritis (currently being managed).  States that her psychiatrists believes she has major depression instead of bipolar 2    Limitations  Other (comment);House hold activities;Standing;Walking   reaching Back to the Right a certain way (back seat in the car).   Diagnostic tests  No recent imaging of the spine    Patient Stated Goals  Wants to strengthen the muscles around her R upper trap.    Currently in Pain?  Yes    Pain Score  8     Pain Location  Neck    Pain Onset  More than a month ago       TREATMENT: Manual therapy:to reduce pain and tissue tension, improve range of motion, neuromodulation, in order to promote improved ability to complete functional activities. - STM to posterior cervical musculature focusing on bilateral UT, LS,R >L. Noted for elevated tension in these regions. Patient reported it relieved some tension and pain in neck and shoulder.   Neuromuscular Re-education: to improve, balance, postural strength, muscle activation patterns, and stabilization strength required for functional activities: - neck stabilizer biofeedback, x10 at each 2 mmHg starting from 20:  22, 24, 26, 28, and 30. Repeated cuing and extended time to understand how to preferentially activate the deep neck flexors. For improved motor control of the neck and shoulder girdle.   -Standing rows with scapular retraction for improved postural and shoulder girdle motor control, strengthening, and mobility. Required instruction for technique and cuing to retract, posteriorly tilt, and depress scapulae.3x10 with yellow theraband. Patient with difficulty relaxing neck and upper trap musclesbut improved from last session and  improved withcuing.  Cuing for form and technique, motor control.   HOME EXERCISE PROGRAM Access Code: [email or web address]      URL: [email or web address]    Date: 09/12/2019  Prepared by: Rosita Kea   Exercises Row with band/cable - 3 sets - 10 reps - 1 second hold - 1x daily Supine Shoulder Horizontal Abduction with Resistance - 3 sets - 10 reps - 1 second hold - 2x daily - 7x weekly Supine PNF D2 Flexion with Resistance - 2-3 sets - 10 reps - 1 second hold - 2x daily - 7x weekly    PT Education - 09/14/19 1302    Education Details  Exercise purpose/form. Self management techniques    Person(s) Educated  Patient    Methods  Explanation;Demonstration;Tactile cues;Verbal cues    Comprehension  Verbalized understanding;Returned demonstration;Verbal cues required;Tactile cues required;Need further instruction       PT Short Term Goals - 09/07/19 1439      PT SHORT TERM GOAL #1   Title  Be independent with initial home exercise program for self-management of symptoms.    Baseline  Initial HEP provided at IE (08/22/2019);    Time  3    Period  Weeks    Status  Achieved    Target Date  09/13/19        PT Long Term Goals - 08/23/19 0927      PT LONG TERM GOAL #1   Title  Be independent with a long-term home exercise program for self-management of symptoms    Baseline  Initial HEP provided at IE (08/22/2019);    Time  8    Period  Weeks    Status  New    Target Date  10/18/19      PT LONG TERM GOAL #2   Title  Improve patient specific functional scale to equal or greater than 8/10 to demonstrate improved self-reported function.    Baseline  5.7/10 (08/22/2019);    Time  8    Status  New    Target Date  10/18/19      PT LONG TERM GOAL #3   Title  Reduce pain with functional activities to equal or less than 310 to allow patient to complete usual activities including ADLs, IADLs, and social engagement with less difficulty.    Baseline  8/10 (08/22/2019);     Time  8    Period  Weeks    Status  New    Target Date  10/18/19      PT LONG TERM GOAL #4   Title  Patient will demonstrate equal or greater than 60 degrees cervical spine rotation each way to improve ability to check blind spot while driving.    Baseline  Rotation: R= 48, L = 38. Both painful at R neck.  (08/22/2019);    Time  8    Period  Weeks    Status  New    Target Date  10/18/19      PT LONG TERM GOAL #5   Title  Complete community, work and/or recreational activities without limitation due to current condition    Baseline  limited in lifting, reaching, housecleaning/vaccuming, quality of life (08/22/2019);    Time  8    Period  Weeks    Status  New    Target Date  10/18/19            Plan - 09/14/19 1404    Clinical Impression Statement  Patient tolerated treatment well overall and reported mild relief of pain in the shoulder and neck during manual. Reported she felt more relaxed by end of session and was able to complete exercises with improved motor control during exercises. Session focused more on pain control due to elevated pain. Patient would benefit from continued physical therapy to address remaining impairments and functional limitations to work towards stated goals and return to PLOF or maximal functional independence.    Personal Factors and Comorbidities  Age;Comorbidity 3+;Behavior Pattern;Past/Current Experience;Fitness;Time since onset of injury/illness/exacerbation;Profession    Comorbidities  anxiety, major depression (currently managed with medication to patient's satisfaction by psychiatrist), fibromyalgia, ADHD, psoriasis, plantar fasciitis, chronic back pain, chronic iritis (currently being managed).    Examination-Activity Limitations  Carry;Reach Overhead;Other;Lift;Hygiene/Grooming;Dressing;Caring for Others;Bathing   reaching   Examination-Participation Restrictions  Cleaning;Community Activity;Driving;Interpersonal Relationship;Other   vaccuming    Stability/Clinical Decision Making  Evolving/Moderate complexity    Rehab Potential  Fair    PT Frequency  2x / week    PT Duration  8 weeks    PT Treatment/Interventions  ADLs/Self Care Home Management;Aquatic Therapy;Cryotherapy;Moist Heat;Therapeutic activities;Therapeutic exercise;Neuromuscular re-education;Cognitive remediation;Patient/family education;Manual techniques;Passive range of motion;Dry needling;Spinal Manipulations;Joint Manipulations    PT Next Visit Plan  continue with strengthening and stretching as tolerated    PT Home Exercise Plan  [place] Access Code: [email or web address]    Consulted and Agree with Plan of Care  Patient       Patient will benefit from skilled therapeutic intervention in order to improve the following deficits and impairments:  Decreased endurance, Decreased mobility, Hypomobility, Increased muscle spasms, Improper body mechanics, Impaired perceived functional ability, Decreased range of motion, Decreased activity tolerance, Decreased strength, Impaired flexibility, Impaired UE functional use, Pain  Visit Diagnosis: Cervicalgia  Chronic right shoulder pain  Muscle weakness (generalized)     Problem List Patient Active Problem List   Diagnosis Date Noted  . Plantar fasciitis 07/09/2014    Everlean Alstrom. Graylon Good, PT, DPT 09/14/19, 2:06 PM  [facility] [address], [place], [address] Phone: [phone]   Fax:  [phone]  Name: Cheryl Schroeder MRN: [id number] Date of Birth: 06/26/1956

## 2019-09-19 ENCOUNTER — Ambulatory Visit: Payer: Medicare HMO | Admitting: Physical Therapy

## 2019-09-22 ENCOUNTER — Ambulatory Visit: Payer: Medicare HMO | Admitting: Physical Therapy

## 2019-09-27 ENCOUNTER — Other Ambulatory Visit: Payer: Self-pay

## 2019-09-27 ENCOUNTER — Encounter: Payer: Self-pay | Admitting: Physical Therapy

## 2019-09-27 ENCOUNTER — Ambulatory Visit: Payer: Medicare HMO | Admitting: Physical Therapy

## 2019-09-27 DIAGNOSIS — G8929 Other chronic pain: Secondary | ICD-10-CM

## 2019-09-27 DIAGNOSIS — M542 Cervicalgia: Secondary | ICD-10-CM | POA: Diagnosis not present

## 2019-09-27 DIAGNOSIS — M25511 Pain in right shoulder: Secondary | ICD-10-CM

## 2019-09-27 DIAGNOSIS — M6281 Muscle weakness (generalized): Secondary | ICD-10-CM

## 2019-09-27 NOTE — Therapy (Signed)
[facility] [address], [place], [address] Phone: [phone]   Fax:  [phone]  Physical Therapy Treatment  Patient Details  Name: Cheryl Schroeder MRN: [id number] Date of Birth: 13-Aug-1956 Referring Provider (PT): Lynnell Jude, MD   Encounter Date: 09/27/2019  PT End of Session - 09/27/19 1725    Visit Number  6    Number of Visits  12    Date for PT Re-Evaluation  10/18/19    Authorization Type  [employer] Medicare reporting period from 08/22/2019    Authorization - Visit Number  6    Authorization - Number of Visits  10    PT Start Time  1430    PT Stop Time  1515    PT Time Calculation (min)  45 min    Activity Tolerance  Patient tolerated treatment well    Behavior During Therapy  [facility] for tasks assessed/performed       Past Medical History:  Diagnosis Date  . Bipolar 2 disorder ([place])   . Fibromyalgia   . Psoriasis     Past Surgical History:  Procedure Laterality Date  . ABDOMINAL HYSTERECTOMY    . ADENOIDECTOMY    . BREAST BIOPSY Right 1990   cyst  . TONSILLECTOMY      There were no vitals filed for this visit.  Subjective Assessment - 09/27/19 1432    Subjective  Patient reports she is worn out taking care of her mother who has had a UTI. She finally slept last night but she has been very tired and had a lot to do to catch up. Patient reports her pain is about 8/10 because she is tired an uptight. Her pain is mostly on the right side and some over the left UT and feels tense.    Pertinent History  Patient is a 63 y.o. female who presents to outpatient physical therapy with a referral for medical diagnosis cervicalgia. This patient's chief complaints consist of right-sided neck, shoulder, and arm pain , leading to the following functional deficits: difficulty reaching, lifting, cleaning house/vaccuming, decreased quality of life. Relevant past medical history and comorbidities include  anxiety, major depression (currently managed with medication to patient's satisfaction by psychiatrist), fibromyalgia, ADHD, psoriasis, plantar fasciitis, chronic back pain, chronic iritis (currently being managed).  States that her psychiatrists believes she has major depression instead of bipolar 2    Limitations  Other (comment);House hold activities;Standing;Walking   reaching Back to the Right a certain way (back seat in the car).   Diagnostic tests  No recent imaging of the spine    Patient Stated Goals  Wants to strengthen the muscles around her R upper trap.    Currently in Pain?  Yes    Pain Score  8     Pain Location  Neck    Pain Onset  More than a month ago       TREATMENT: Manual therapy:to reduce pain and tissue tension, improve range of motion, neuromodulation, in order to promote improved ability to complete functional activities. - STM to posterior cervical musculature focusing on bilateral UT, LS,R >L. Noted for elevated tension in these regions. Patient reported it relieved some tension and pain in neck and shoulder.   Neuromuscular Re-education: to improve, balance, postural strength, muscle activation patterns, and stabilization strength required for functional activities:  - supine deep neck flexion upper cervical nod with extensive tactile and verbal cuing to improve coordination for preferential activation  of deep muscles with relaxation of superficial muscles. Patient with great difficulty relaxing superficial muscles. x20 following practice to be able to perform.    Therapeutic exercise: to centralize symptoms and improve ROM, strength, muscular endurance, and activity tolerance required for successful completion of functional activities.   - supine horizontal abduction with yellow theraband 2x20 with cuing to depress and retract scapulae.  - supine D2 shoulder flexion using yellow theraband, 2x20 each side. Cuing to depress and retract scapulae while relaxing  neck. - hooklying diaphragmatic breathing with extensive cuing for coordination of breathing through belly while chest remains relaxed. Plus 5 minutes practice. Patient reported very relaxing while completing exercise, felt ready to stop, then felt anxious and self-calmed again with technique at end of session.   - reviewed and updated HEP to be more acheiveable and to include relaxation techniques.   Multimodal cuing throughout session for form and technique, motor control.   HOME EXERCISE PROGRAM Access Code: [email or web address]  URL: [email or web address]  Date: 09/27/2019  Prepared by: Rosita Kea   Exercises Supine Shoulder Horizontal Abduction with Resistance - 2 sets - 20 reps - 1 second hold - 1x daily Supine PNF D2 Flexion with Resistance - 2 sets - 20 reps - 1 second hold - 1x daily Supine Head Nod Deep Neck Flexor Training - 20 reps - 5 seconds hold - 1x daily    PT Education - 09/27/19 1725    Education Details  Exercise purpose/form. Self management techniques    Person(s) Educated  Patient    Methods  Explanation;Demonstration;Tactile cues;Verbal cues;Handout    Comprehension  Verbalized understanding;Returned demonstration;Verbal cues required;Need further instruction;Tactile cues required       PT Short Term Goals - 09/07/19 1439      PT SHORT TERM GOAL #1   Title  Be independent with initial home exercise program for self-management of symptoms.    Baseline  Initial HEP provided at IE (08/22/2019);    Time  3    Period  Weeks    Status  Achieved    Target Date  09/13/19        PT Long Term Goals - 08/23/19 0927      PT LONG TERM GOAL #1   Title  Be independent with a long-term home exercise program for self-management of symptoms    Baseline  Initial HEP provided at IE (08/22/2019);    Time  8    Period  Weeks    Status  New    Target Date  10/18/19      PT LONG TERM GOAL #2   Title  Improve patient specific functional scale to equal or  greater than 8/10 to demonstrate improved self-reported function.    Baseline  5.7/10 (08/22/2019);    Time  8    Status  New    Target Date  10/18/19      PT LONG TERM GOAL #3   Title  Reduce pain with functional activities to equal or less than 310 to allow patient to complete usual activities including ADLs, IADLs, and social engagement with less difficulty.    Baseline  8/10 (08/22/2019);    Time  8    Period  Weeks    Status  New    Target Date  10/18/19      PT LONG TERM GOAL #4   Title  Patient will demonstrate equal or greater than 60 degrees cervical spine rotation each way to improve ability to check blind spot while driving.  Baseline  Rotation: R= 48, L = 38. Both painful at R neck.  (08/22/2019);    Time  8    Period  Weeks    Status  New    Target Date  10/18/19      PT LONG TERM GOAL #5   Title  Complete community, work and/or recreational activities without limitation due to current condition    Baseline  limited in lifting, reaching, housecleaning/vaccuming, quality of life (08/22/2019);    Time  8    Period  Weeks    Status  New    Target Date  10/18/19            Plan - 09/27/19 1730    Clinical Impression Statement  Patient tolerated treatment well and reported feeling more relaxed by end of session. Noted for tension and tightness in bilateral UT/LS, SCM and surrounding musculature that softened with manual therapy. Had great difficulty activating deep cervical neck flexors without SCM but improved with cuing and practice. Able to perform diaphragmatic breathing in a relaxing manner by end of session. Patient would benefit from continued physical therapy to address remaining impairments and functional limitations to work towards stated goals and return to PLOF or maximal functional independence.    Personal Factors and Comorbidities  Age;Comorbidity 3+;Behavior Pattern;Past/Current Experience;Fitness;Time since onset of  injury/illness/exacerbation;Profession    Comorbidities  anxiety, major depression (currently managed with medication to patient's satisfaction by psychiatrist), fibromyalgia, ADHD, psoriasis, plantar fasciitis, chronic back pain, chronic iritis (currently being managed).    Examination-Activity Limitations  Carry;Reach Overhead;Other;Lift;Hygiene/Grooming;Dressing;Caring for Others;Bathing   reaching   Examination-Participation Restrictions  Cleaning;Community Activity;Driving;Interpersonal Relationship;Other   vaccuming   Stability/Clinical Decision Making  Evolving/Moderate complexity    Rehab Potential  Fair    PT Frequency  2x / week    PT Duration  8 weeks    PT Treatment/Interventions  ADLs/Self Care Home Management;Aquatic Therapy;Cryotherapy;Moist Heat;Therapeutic activities;Therapeutic exercise;Neuromuscular re-education;Cognitive remediation;Patient/family education;Manual techniques;Passive range of motion;Dry needling;Spinal Manipulations;Joint Manipulations    PT Next Visit Plan  continue with strengthening and stretching as tolerated    PT Home Exercise Plan  [place] Access Code: [email or web address]    Consulted and Agree with Plan of Care  Patient       Patient will benefit from skilled therapeutic intervention in order to improve the following deficits and impairments:  Decreased endurance, Decreased mobility, Hypomobility, Increased muscle spasms, Improper body mechanics, Impaired perceived functional ability, Decreased range of motion, Decreased activity tolerance, Decreased strength, Impaired flexibility, Impaired UE functional use, Pain  Visit Diagnosis: Cervicalgia  Chronic right shoulder pain  Muscle weakness (generalized)     Problem List Patient Active Problem List   Diagnosis Date Noted  . Plantar fasciitis 07/09/2014   Everlean Alstrom. Graylon Good, PT, DPT 09/27/19, 5:33 PM  [facility] [address] [place], [address] Phone: [phone]   Fax:  [phone]  Name: Cheryl Schroeder MRN: [id number] Date of Birth: 1956/04/06

## 2019-10-04 ENCOUNTER — Other Ambulatory Visit: Payer: Self-pay

## 2019-10-04 ENCOUNTER — Encounter: Payer: Self-pay | Admitting: Physical Therapy

## 2019-10-04 ENCOUNTER — Ambulatory Visit: Payer: Medicare HMO | Admitting: Physical Therapy

## 2019-10-04 DIAGNOSIS — M6281 Muscle weakness (generalized): Secondary | ICD-10-CM

## 2019-10-04 DIAGNOSIS — G8929 Other chronic pain: Secondary | ICD-10-CM

## 2019-10-04 DIAGNOSIS — M25511 Pain in right shoulder: Secondary | ICD-10-CM

## 2019-10-04 DIAGNOSIS — M542 Cervicalgia: Secondary | ICD-10-CM

## 2019-10-04 NOTE — Therapy (Signed)
[facility] [address], [place], [address] Phone: [phone]   Fax:  [phone]  Physical Therapy Treatment  Patient Details  Name: Cheryl Schroeder MRN: [id number] Date of Birth: [birth year] Referring Provider (PT): Lynnell Jude, MD   Encounter Date: 10/04/2019  PT End of Session - 10/04/19 1745    Visit Number  7    Number of Visits  12    Date for PT Re-Evaluation  10/18/19    Authorization Type  [employer] Medicare reporting period from 08/22/2019    Authorization - Visit Number  7    Authorization - Number of Visits  10    PT Start Time  8937    PT Stop Time  1728    PT Time Calculation (min)  38 min    Activity Tolerance  Patient tolerated treatment well    Behavior During Therapy  [facility] for tasks assessed/performed       Past Medical History:  Diagnosis Date  . Bipolar 2 disorder ([place])   . Fibromyalgia   . Psoriasis     Past Surgical History:  Procedure Laterality Date  . ABDOMINAL HYSTERECTOMY    . ADENOIDECTOMY    . BREAST BIOPSY Right 1990   cyst  . TONSILLECTOMY      There were no vitals filed for this visit.  Subjective Assessment - 10/04/19 1650    Subjective  Patinet reports her neck and shoulder is about 2/10 pain upon arrival. She states it hurt a lot following last treatment session and she felt like the manual therapy was too deep. She was able to modulate her pain with some medication. She has not been consistent with her HEP due to the holidays.    Pertinent History  Patient is a 63 y.o. female who presents to outpatient physical therapy with a referral for medical diagnosis cervicalgia. This patient's chief complaints consist of right-sided neck, shoulder, and arm pain , leading to the following functional deficits: difficulty reaching, lifting, cleaning house/vaccuming, decreased quality of life. Relevant past medical history and comorbidities include anxiety, major depression  (currently managed with medication to patient's satisfaction by psychiatrist), fibromyalgia, ADHD, psoriasis, plantar fasciitis, chronic back pain, chronic iritis (currently being managed).  States that her psychiatrists believes she has major depression instead of bipolar 2    Limitations  Other (comment);House hold activities;Standing;Walking   reaching Back to the Right a certain way (back seat in the car).   Diagnostic tests  No recent imaging of the spine    Patient Stated Goals  Wants to strengthen the muscles around her R upper trap.    Currently in Pain?  Yes    Pain Score  2     Pain Onset  More than a month ago      TREATMENT: Denies sensitivity to latex  Manual therapy:to reduce pain and tissue tension, improve range of motion, neuromodulation, in order to promote improved ability to complete functional activities. - STM to posterior cervical musculature focusing on bilateral UT, LS,R >L. Noted for elevated tension in these regions. Remained more superficial this session. Patient reported better intensity compared to last treatment session.   Neuromuscular Re-education: to improve, balance, postural strength, muscle activation patterns, and stabilization strength required for functional activities: - supine deep neck flexion upper cervical nod with extensive tactile and verbal cuing to improve coordination for preferential activation of deep muscles with relaxation of superficial muscles. Patient with improved ability to correctly  activate muscles.    Therapeutic exercise:to centralize symptoms and improve ROM, strength, muscular endurance, and activity tolerance required for successful completion of functional activities.  - supine horizontal abduction with yellow theraband2x20with cuing to depress and retract scapulae.  - supine D2 shoulder flexion using yellow theraband,2x20each side. Cuing to depress and retract scapulae while relaxing neck. -Standing rows with  scapular retraction for improved postural and shoulder girdle motor control, strengthening, and mobility. Required instruction for technique and cuing to retract, posteriorly tilt, and depress scapulae.3x10 with green theraband. Patient with improved ability to relax neck/throat from last session and improved withcuing. - encouraged HEP participation  Multimodal cuing throughout session for form and technique, motor control.   HOME EXERCISE PROGRAM Access Code: [email or web address]      URL: [email or web address]    Date: 09/27/2019  Prepared by: Rosita Kea   Exercises Supine Shoulder Horizontal Abduction with Resistance - 2 sets - 20 reps - 1 second hold - 1x daily Supine PNF D2 Flexion with Resistance - 2 sets - 20 reps - 1 second hold - 1x daily Supine Head Nod Deep Neck Flexor Training - 20 reps - 5 seconds hold - 1x daily   PT Education - 10/04/19 1743    Education Details  Exercise purpose/form. Self management techniques    Person(s) Educated  Patient    Methods  Explanation;Demonstration;Tactile cues;Verbal cues    Comprehension  Verbalized understanding;Returned demonstration;Verbal cues required;Tactile cues required;Need further instruction       PT Short Term Goals - 09/07/19 1439      PT SHORT TERM GOAL #1   Title  Be independent with initial home exercise program for self-management of symptoms.    Baseline  Initial HEP provided at IE (08/22/2019);    Time  3    Period  Weeks    Status  Achieved    Target Date  09/13/19        PT Long Term Goals - 08/23/19 0927      PT LONG TERM GOAL #1   Title  Be independent with a long-term home exercise program for self-management of symptoms    Baseline  Initial HEP provided at IE (08/22/2019);    Time  8    Period  Weeks    Status  New    Target Date  10/18/19      PT LONG TERM GOAL #2   Title  Improve patient specific functional scale to equal or greater than 8/10 to demonstrate improved self-reported  function.    Baseline  5.7/10 (08/22/2019);    Time  8    Status  New    Target Date  10/18/19      PT LONG TERM GOAL #3   Title  Reduce pain with functional activities to equal or less than 310 to allow patient to complete usual activities including ADLs, IADLs, and social engagement with less difficulty.    Baseline  8/10 (08/22/2019);    Time  8    Period  Weeks    Status  New    Target Date  10/18/19      PT LONG TERM GOAL #4   Title  Patient will demonstrate equal or greater than 60 degrees cervical spine rotation each way to improve ability to check blind spot while driving.    Baseline  Rotation: R= 48, L = 38. Both painful at R neck.  (08/22/2019);    Time  8    Period  Weeks    Status  New    Target Date  10/18/19      PT LONG TERM GOAL #5   Title  Complete community, work and/or recreational activities without limitation due to current condition    Baseline  limited in lifting, reaching, housecleaning/vaccuming, quality of life (08/22/2019);    Time  8    Period  Weeks    Status  New    Target Date  10/18/19            Plan - 10/04/19 1744    Clinical Impression Statement  Patient tolerated treatment well overall. Depth of STM was reduced due to feeling increased pain following last treatment session. Continued with postural strengthening and re-education of cervical musculature. Repeated exercises from previous session due to being away from HEP since last session and was able to progress rows with improved motor control. Patient would benefit from continued physical therapy to address remaining impairments and functional limitations to work towards stated goals and return to PLOF or maximal functional independence.    Personal Factors and Comorbidities  Age;Comorbidity 3+;Behavior Pattern;Past/Current Experience;Fitness;Time since onset of injury/illness/exacerbation;Profession    Comorbidities  anxiety, major depression (currently managed with medication to  patient's satisfaction by psychiatrist), fibromyalgia, ADHD, psoriasis, plantar fasciitis, chronic back pain, chronic iritis (currently being managed).    Examination-Activity Limitations  Carry;Reach Overhead;Other;Lift;Hygiene/Grooming;Dressing;Caring for Others;Bathing   reaching   Examination-Participation Restrictions  Cleaning;Community Activity;Driving;Interpersonal Relationship;Other   vaccuming   Stability/Clinical Decision Making  Evolving/Moderate complexity    Rehab Potential  Fair    PT Frequency  2x / week    PT Duration  8 weeks    PT Treatment/Interventions  ADLs/Self Care Home Management;Aquatic Therapy;Cryotherapy;Moist Heat;Therapeutic activities;Therapeutic exercise;Neuromuscular re-education;Cognitive remediation;Patient/family education;Manual techniques;Passive range of motion;Dry needling;Spinal Manipulations;Joint Manipulations    PT Next Visit Plan  continue with strengthening and stretching as tolerated    PT Home Exercise Plan  [place] Access Code: [email or web address]    Consulted and Agree with Plan of Care  Patient       Patient will benefit from skilled therapeutic intervention in order to improve the following deficits and impairments:  Decreased endurance, Decreased mobility, Hypomobility, Increased muscle spasms, Improper body mechanics, Impaired perceived functional ability, Decreased range of motion, Decreased activity tolerance, Decreased strength, Impaired flexibility, Impaired UE functional use, Pain  Visit Diagnosis: Cervicalgia  Chronic right shoulder pain  Muscle weakness (generalized)     Problem List Patient Active Problem List   Diagnosis Date Noted  . Plantar fasciitis 07/09/2014    Everlean Alstrom. Graylon Good, PT, DPT 10/04/19, 5:47 PM  [facility] [address] [place], [address] Phone: [phone]   Fax:  [phone]  Name: Cheryl Schroeder MRN: [id number] Date of Birth:  1956/06/20

## 2019-10-06 ENCOUNTER — Ambulatory Visit: Payer: Medicare HMO | Admitting: Physical Therapy

## 2019-10-10 ENCOUNTER — Ambulatory Visit: Payer: Medicare HMO | Admitting: Physical Therapy

## 2019-10-13 ENCOUNTER — Other Ambulatory Visit: Payer: Self-pay

## 2019-10-13 ENCOUNTER — Ambulatory Visit: Payer: Medicare HMO | Attending: Family Medicine | Admitting: Physical Therapy

## 2019-10-13 ENCOUNTER — Encounter: Payer: Self-pay | Admitting: Physical Therapy

## 2019-10-13 DIAGNOSIS — M542 Cervicalgia: Secondary | ICD-10-CM

## 2019-10-13 DIAGNOSIS — G8929 Other chronic pain: Secondary | ICD-10-CM | POA: Insufficient documentation

## 2019-10-13 DIAGNOSIS — M25511 Pain in right shoulder: Secondary | ICD-10-CM | POA: Diagnosis present

## 2019-10-13 DIAGNOSIS — M6281 Muscle weakness (generalized): Secondary | ICD-10-CM | POA: Diagnosis present

## 2019-10-13 NOTE — Therapy (Signed)
[facility] [address] [place], [address] Phone: [phone]   Fax:  [phone]  Physical Therapy Treatment  Patient Details  Name: Cheryl Schroeder MRN: [id number] Date of Birth: May 11, 1956 Referring Provider (PT): Lynnell Jude, MD   Encounter Date: 10/13/2019  PT End of Session - 10/13/19 1145    Visit Number  8    Number of Visits  12    Date for PT Re-Evaluation  10/18/19    Authorization Type  [employer] reporting period from 08/22/2019    Authorization - Visit Number  8    Authorization - Number of Visits  10    PT Start Time  1125    PT Stop Time  1203    PT Time Calculation (min)  38 min    Activity Tolerance  Patient tolerated treatment well    Behavior During Therapy  [facility] for tasks assessed/performed       Past Medical History:  Diagnosis Date  . Bipolar 2 disorder ([place])   . Fibromyalgia   . Psoriasis     Past Surgical History:  Procedure Laterality Date  . ABDOMINAL HYSTERECTOMY    . ADENOIDECTOMY    . BREAST BIOPSY Right 1990   cyst  . TONSILLECTOMY      There were no vitals filed for this visit.  Subjective Assessment - 10/13/19 1121    Subjective  Patient reports she has not been feeling well (mostly fibromyalgia flairs and diffiuclty with sleeping), has had some problems with her teeth, and her mother keeps falling (but usually not serious) including yesterday. She states all of this has been keeping her from physical therapy, and she has not been doing her HEP well with all of this going on. Her neck and shoulder are not feeling well and have gone from good to "horrible" too. When she picked up her mother last night she thinks she used her R shoulder but she think she pulled it a little bit so it seems a bit achier today. She states she felt okay following last treatment session. Rates her pain today at 8/10 in her B upper traps.    Pertinent History  Patient is a 64 y.o.  female who presents to outpatient physical therapy with a referral for medical diagnosis cervicalgia. This patient's chief complaints consist of right-sided neck, shoulder, and arm pain , leading to the following functional deficits: difficulty reaching, lifting, cleaning house/vaccuming, decreased quality of life. Relevant past medical history and comorbidities include anxiety, major depression (currently managed with medication to patient's satisfaction by psychiatrist), fibromyalgia, ADHD, psoriasis, plantar fasciitis, chronic back pain, chronic iritis (currently being managed).  States that her psychiatrists believes she has major depression instead of bipolar 2    Limitations  Other (comment);House hold activities;Standing;Walking   reaching Back to the Right a certain way (back seat in the car).   Diagnostic tests  No recent imaging of the spine    Patient Stated Goals  Wants to strengthen the muscles around her R upper trap.    Currently in Pain?  Yes    Pain Score  8     Pain Onset  More than a month ago       TREATMENT: Denies sensitivity to latex  Manual therapy:to reduce pain and tissue tension, improve range of motion, neuromodulation, in order to promote improved ability to complete functional activities. - STM to posterior cervical musculature focusing on bilateral UT, LS,R >L.  Noted for elevated tension in these regions. Remained more superficial this session. Patient reported better intensity compared to last treatment session.   Neuromuscular Re-education:to improve, balance, postural strength, muscle activation patterns, and stabilization strength required for functional activities: - supine deep neck flexion upper cervical nod with extensive tactile and verbal cuing to improve coordination for preferential activation of deep muscles with relaxation of superficial muscles. x20 Patient with improved ability to correctly activate muscles.    Therapeutic exercise:to  centralize symptoms and improve ROM, strength, muscular endurance, and activity tolerance required for successful completion of functional activities. - supine horizontal abduction with yellow therabandx20with cuing to depress and retract scapulae.  - supine D2 shoulder flexion using yellow theraband,x20each side. Cuing to depress and retract scapulae while relaxing neck. - Sidelying open book (thoracic rotation) to improve thoracic, shoulder girdle, and upper trunk mobility. Required instruction for technique and cuing to achieve end range as tolerated, hold time, and breathing technique. x10 each side. Modified on R with elbow flexed to decrease intensity of stretch.  -Standing rows with scapular retraction for improved postural and shoulder girdlemotor control,strengthening, andmobility. Required instruction for technique and cuing to retract, posteriorly tilt, and depress scapulae.2x10with green theraband. Patient with improved ability to relax neck/throat from last session and improved withcuing. - encouraged HEP participation  Multimodal cuingthroughout sessionfor form and technique, motor control.   HOME EXERCISE PROGRAM Access Code: [email or web address] URL: [email or web address] Date: 09/27/2019  Prepared by: Rosita Kea   Exercises Supine Shoulder Horizontal Abduction with Resistance - 2 sets - 20 reps - 1 second hold - 1x daily Supine PNF D2 Flexion with Resistance - 2 sets - 20 reps - 1 second hold - 1x daily Supine Head Nod Deep Neck Flexor Training - 20 reps - 5 seconds hold - 1x daily   PT Education - 10/13/19 1125    Education Details  Exercise purpose/form. Self management techniques    Person(s) Educated  Patient    Methods  Explanation;Demonstration;Tactile cues;Verbal cues    Comprehension  Verbalized understanding;Returned demonstration;Verbal cues required;Tactile cues required;Need further instruction       PT Short Term Goals -  09/07/19 1439      PT SHORT TERM GOAL #1   Title  Be independent with initial home exercise program for self-management of symptoms.    Baseline  Initial HEP provided at IE (08/22/2019);    Time  3    Period  Weeks    Status  Achieved    Target Date  09/13/19        PT Long Term Goals - 08/23/19 0927      PT LONG TERM GOAL #1   Title  Be independent with a long-term home exercise program for self-management of symptoms    Baseline  Initial HEP provided at IE (08/22/2019);    Time  8    Period  Weeks    Status  New    Target Date  10/18/19      PT LONG TERM GOAL #2   Title  Improve patient specific functional scale to equal or greater than 8/10 to demonstrate improved self-reported function.    Baseline  5.7/10 (08/22/2019);    Time  8    Status  New    Target Date  10/18/19      PT LONG TERM GOAL #3   Title  Reduce pain with functional activities to equal or less than 310 to allow patient to complete usual activities including ADLs, IADLs, and social engagement  with less difficulty.    Baseline  8/10 (08/22/2019);    Time  8    Period  Weeks    Status  New    Target Date  10/18/19      PT LONG TERM GOAL #4   Title  Patient will demonstrate equal or greater than 60 degrees cervical spine rotation each way to improve ability to check blind spot while driving.    Baseline  Rotation: R= 48, L = 38. Both painful at R neck.  (08/22/2019);    Time  8    Period  Weeks    Status  New    Target Date  10/18/19      PT LONG TERM GOAL #5   Title  Complete community, work and/or recreational activities without limitation due to current condition    Baseline  limited in lifting, reaching, housecleaning/vaccuming, quality of life (08/22/2019);    Time  8    Period  Weeks    Status  New    Target Date  10/18/19            Plan - 10/13/19 1149    Clinical Impression Statement  Patient tolerated treatment well overall. Noted for increased tension in her B UT R>L but  reported manual felt good and helped her relax some. Returned to previous exercises due to lack of performing HEP at home and elevated pain today. Able to complete comfortably. Patient would benefit from continued management of limiting condition by skilled physical therapist to address remaining impairments and functional limitations to work towards stated goals and return to PLOF or maximal functional independence.    Personal Factors and Comorbidities  Age;Comorbidity 3+;Behavior Pattern;Past/Current Experience;Fitness;Time since onset of injury/illness/exacerbation;Profession    Comorbidities  anxiety, major depression (currently managed with medication to patient's satisfaction by psychiatrist), fibromyalgia, ADHD, psoriasis, plantar fasciitis, chronic back pain, chronic iritis (currently being managed).    Examination-Activity Limitations  Carry;Reach Overhead;Other;Lift;Hygiene/Grooming;Dressing;Caring for Others;Bathing   reaching   Examination-Participation Restrictions  Cleaning;Community Activity;Driving;Interpersonal Relationship;Other   vaccuming   Stability/Clinical Decision Making  Evolving/Moderate complexity    Rehab Potential  Fair    PT Frequency  2x / week    PT Duration  8 weeks    PT Treatment/Interventions  ADLs/Self Care Home Management;Aquatic Therapy;Cryotherapy;Moist Heat;Therapeutic activities;Therapeutic exercise;Neuromuscular re-education;Cognitive remediation;Patient/family education;Manual techniques;Passive range of motion;Dry needling;Spinal Manipulations;Joint Manipulations    PT Next Visit Plan  continue with strengthening and stretching as tolerated    PT Home Exercise Plan  [facility] Access Code: [email or web address]    Consulted and Agree with Plan of Care  Patient       Patient will benefit from skilled therapeutic intervention in order to improve the following deficits and impairments:  Decreased endurance, Decreased mobility, Hypomobility, Increased muscle spasms,  Improper body mechanics, Impaired perceived functional ability, Decreased range of motion, Decreased activity tolerance, Decreased strength, Impaired flexibility, Impaired UE functional use, Pain  Visit Diagnosis: Cervicalgia  Chronic right shoulder pain  Muscle weakness (generalized)     Problem List Patient Active Problem List   Diagnosis Date Noted  . Plantar fasciitis 07/09/2014    Everlean Alstrom. Graylon Good, PT, DPT 10/13/19, 5:13 PM  [facility] [address] [place], [address] Phone: [phone]   Fax:  [phone]  Name: KAYLENE DAWN MRN: [id number] Date of Birth: 07-19-1956

## 2019-10-17 ENCOUNTER — Other Ambulatory Visit: Payer: Self-pay

## 2019-10-17 ENCOUNTER — Encounter: Payer: Self-pay | Admitting: Physical Therapy

## 2019-10-17 ENCOUNTER — Ambulatory Visit: Payer: Medicare HMO | Admitting: Physical Therapy

## 2019-10-17 DIAGNOSIS — M6281 Muscle weakness (generalized): Secondary | ICD-10-CM

## 2019-10-17 DIAGNOSIS — M25511 Pain in right shoulder: Secondary | ICD-10-CM

## 2019-10-17 DIAGNOSIS — M542 Cervicalgia: Secondary | ICD-10-CM

## 2019-10-17 DIAGNOSIS — G8929 Other chronic pain: Secondary | ICD-10-CM

## 2019-10-17 NOTE — Therapy (Signed)
[facility] [address], [place], [address] Phone: [phone]   Fax:  [phone]  Physical Therapy Treatment / Progress Note / Re-Certification Reporting period: 08/22/2019 - 10/17/2019  Patient Details  Name: Cheryl Schroeder MRN: [id number] Date of Birth: 1956/07/01 Referring Provider (PT): Lynnell Jude, MD   Encounter Date: 10/17/2019  PT End of Session - 10/17/19 0952    Visit Number  9    Number of Visits  12    Date for PT Re-Evaluation  10/18/19    Authorization Type  [employer] reporting period from 08/22/2019    Authorization - Visit Number  9    Authorization - Number of Visits  10    PT Start Time  0945    PT Stop Time  1025    PT Time Calculation (min)  40 min    Activity Tolerance  Patient tolerated treatment well    Behavior During Therapy  [facility] for tasks assessed/performed       Past Medical History:  Diagnosis Date  . Bipolar 2 disorder ([place])   . Fibromyalgia   . Psoriasis     Past Surgical History:  Procedure Laterality Date  . ABDOMINAL HYSTERECTOMY    . ADENOIDECTOMY    . BREAST BIOPSY Right 1990   cyst  . TONSILLECTOMY      There were no vitals filed for this visit.  Subjective Assessment - 10/17/19 0946    Subjective  Patient reports she is feeling pretty good today. She has an "ache" that she rates 4/10 from her R shoulder to elbow. She states she felt okay following last treatment session. She did her HEP 3 times since last session and the relaxation exercise since then. She continues to have problems with R sided arm/shoulder pain. She is having diffiuclty sleeping, especially if she sleeps on it. She is unsure if physical therapy is helping her. She feels like the exercises made her worse this time, increased the achiness. She reports she feels like  physical therapy was helping at first but now she feels like the more she uses her R arm the worse it gets.     Pertinent History  Patient is a 63 y.o. female who presents to outpatient physical therapy with a referral for medical diagnosis cervicalgia. This patient's chief complaints consist of right-sided neck, shoulder, and arm pain , leading to the following functional deficits: difficulty reaching, lifting, cleaning house/vaccuming, decreased quality of life. Relevant past medical history and comorbidities include anxiety, major depression (currently managed with medication to patient's satisfaction by psychiatrist), fibromyalgia, ADHD, psoriasis, plantar fasciitis, chronic back pain, chronic iritis (currently being managed).  States that her psychiatrists believes she has major depression instead of bipolar 2    Limitations  Other (comment);House hold activities;Standing;Walking   reaching Back to the Right a certain way (back seat in the car).   Diagnostic tests  No recent imaging of the spine    Patient Stated Goals  Wants to strengthen the muscles around her R upper trap.    Currently in Pain?  Yes    Pain Score  4     Pain Location  Shoulder    Pain Orientation  Right    Pain Descriptors / Indicators  Aching    Pain Type  Chronic pain    Pain Radiating Towards  denies numbness/tingling, Pain is from shoulder to elbow.    Pain Onset  More than a month ago  Pain Frequency  Intermittent    Aggravating Factors   reaching backwards a certain way, using R hand such as wiping    Pain Relieving Factors  ice, heat    Effect of Pain on Daily Activities  makes her not want to do anything, makes her feel yucky but not as much. difficulty reaching, lifting, cleaning house/vaccuming, decreased quality of life.         [facility] PT Assessment - 10/17/19 0001      Assessment   Medical Diagnosis  cervicalgia    Referring Provider (PT)  Lynnell Jude, MD    Onset Date/Surgical Date  06/30/19    Hand Dominance  Left   left for writing, right for everything else   Next MD Visit  none scheduled    Prior  Therapy  none for this problem prior to this episode of care.       Precautions   Precautions  None      Restrictions   Weight Bearing Restrictions  No      Home Environment   Living Environment  Private residence    Living Arrangements  Parent    Type of [place] to enter    Entrance Stairs-Number of Steps  2    [place]  One level    Home Equipment  --   mother has equipment and is quite independent.     Prior Function   Level of Independence  Independent    Vocation  On disability   Nurse   Leisure  reading, being outside, planting flowers, be active      Observation/Other Assessments   Observations  see note from 10/17/2019 for latest objective data       OBJECTIVE  OUTCOME MEASURES Patient Specific Functional Scale (rated 0 = unable to perform, 10 = able to perform without limitations) 1. Lifting: 5/10 2. Vacuuming/cleaning house: 5/10 3. Prolonged standing: 5/10  (maybe 45 minutes, then back starts hurting), 5/10.  Average: 5   OBSERVATION/INSPECTION  Posture: improved when aware. Can fall into slumped forward head when not thinking about it.   Tremor: none  Muscle bulk: minimal muscle mass noted at bilateral shoulders and throughout body. Relative hypertrophy noted at L upper trap compared to R. Significant asymmetry in SCM, upper trap, and levator scap region with R side significantly less bulky than the left.   Skin: WNL   Gait: grossly WFL for household and short community ambulation. More detailed gait analysis deferred to later date as needed.    SPINE MOTION Cervical Spine AROM *Indicates pain Flexion: = 75, posterior neck pain Extension: = 50, slight pinching in neck Rotation: R= 52, L = 30. Both painful at R neck.  Side Flexion: R= 30 mild pain R UT and upper back, L = 25 lots of pain R RUT Slow moving. Reports none of these motions affect her concordant arm pain.     PERIPHERAL JOINT MOTION (in degrees) Comments: B shoulders WFL AROM and PROM  Except pain with active IR on R. Patient moves cautiously but more confidently than at initial eval  MUSCLE PERFORMANCE (MMT):  *Indicates pain 08/22/2019 10/17/19 Date  Joint/Motion R/L R/L R/L  Shoulder     Flexion 4*/5 4*/4+ /  Extension 5/5 / /  Abduction 4*/5 4+/4+ /  External rotation 4+/4+ 4+/4+ /  Internal rotation 4+*/5 4+/4+ /  Elbow  Flexion 5/5 5/5 /  Extension 5/5 5/5 /  Hand     Grip WFL L>R WFL L>R /  Comments: sudden concordant pain with IR, fearful of extension on R.   R arm pain   SPECIAL TESTS CERVICAL SPINE - cervical axial compression/distraction = no change in concordant arm pain (local neck pain) - spurling's = negative for change in concordant arm pain - R shoulder abduction = no change in concordant arm pain.  SHOULDER - Positive on R: hawkins-kennedy in scaption, cross over test, painful arc, empty can.  - negative on R: o-brien's, hawkin's-kennedy in flexion plane, neer's, and speed   ACCESSORY MOTION:  - Hypomobile to CPA in supine throughout cervical spine, especially at mid and base of C-spine.  - R GH joint feels better with grade III mobilizations all directions and with distraction. Movement WFL.   PALPATION: - TTP at L R deltoid and superolateral corner of L shoulder.  - significantly TTP at R upper trap  - more diffusely TTP at all cervical spine musculature - tightness and trigger point noted in B UT, levator scap, R > L  FUNCTIONAL MOBILITY:  Bed mobility: WFL.  Transfers: WFL.  EDUCATION/COGNITION: Patient is alert and oriented X 4. Improved anxiety and able to provide answers to PSFS more easily compared to initial eval.   Objective measurements completed on examination: See above findings   TREATMENT: Denies sensitivity to latex  Therapeutic exercise:to centralize symptoms and improve ROM, strength, muscular endurance,  and activity tolerance required for successful completion of functional activities. - Objective measurements to assess progress and further guide care (see above).  - R shoulder pendulum exercises with cuing and attempts to complete with passive movement in the shoulder.  - Education on diagnosis, prognosis, POC, anatomy and physiology of current condition.  - Education on HEP including handout   Manual therapy:to reduce pain and tissue tension, improve range of motion, neuromodulation, in order to promote improved ability to complete functional activities. - PROM of R shoulder to assess progress and guide future care - Accessory motion assessment at R [facility] joint including grade II-IV joint mobilizations AP, caudal, and distraction to decrease pain.  - palpation and gentle STM at R shoulder to decrease pain and tension.   Pt required multimodal cuing for proper technique and to facilitate improved neuromuscular control, strength, range of motion, and functional ability resulting in improved performance and form. Had limited ability to perform pendulum exercise with relaxed UE, but reported no discomfort.    HOME EXERCISE PROGRAM Access Code: [email or web address]  URL: [email or web address]  Date: 10/17/2019  Prepared by: Rosita Kea   Exercises Circular Shoulder Pendulum with Table Support - 3-55 minutes - 3x daily Supine Shoulder Horizontal Abduction with Resistance - 2 sets - 20 reps - 1 second hold - 1x daily Supine PNF D2 Flexion with Resistance - 2 sets - 20 reps - 1 second hold - 1x daily Supine Head Nod Deep Neck Flexor Training - 20 reps - 5 seconds hold - 1x daily   PT Education - 10/17/19 0952    Education Details  Exercise purpose/form. Self management techniques    Person(s) Educated  Patient    Methods  Explanation;Demonstration;Tactile cues;Verbal cues    Comprehension  Verbalized understanding;Returned demonstration;Verbal cues required;Tactile cues required        PT Short Term Goals - 09/07/19 1439      PT SHORT TERM GOAL #1   Title  Be independent with initial home exercise program for  self-management of symptoms.    Baseline  Initial HEP provided at IE (08/22/2019);    Time  3    Period  Weeks    Status  Achieved    Target Date  09/13/19        PT Long Term Goals - 10/17/19 1505      PT LONG TERM GOAL #1   Title  Be independent with a long-term home exercise program for self-management of symptoms    Baseline  Initial HEP provided at IE (08/22/2019); has not yet received final long term HEP (10/17/2019);    Time  6    Period  Weeks    Status  Partially Met    Target Date  11/28/19      PT LONG TERM GOAL #2   Title  Improve patient specific functional scale to equal or greater than 8/10 to demonstrate improved self-reported function.    Baseline  5.7/10 (08/22/2019); 5/10 (10/17/2019);    Time  6    Period  Weeks    Status  On-going    Target Date  11/28/19      PT LONG TERM GOAL #3   Title  Reduce pain with functional activities to equal or less than 3/10 to allow patient to complete usual activities including ADLs, IADLs, and social engagement with less difficulty.    Baseline  8/10 (08/22/2019); 8/10 (10/17/2019);    Time  6    Period  Weeks    Status  On-going    Target Date  11/28/19      PT LONG TERM GOAL #4   Title  Patient will demonstrate equal or greater than 60 degrees cervical spine rotation each way to improve ability to check blind spot while driving.    Baseline  Rotation: R= 48, L = 38. Both painful at R neck.  (08/22/2019); Rotation: R= 52, L = 30. Both painful at R neck.(10/17/2019);    Time  6    Period  Weeks    Status  On-going    Target Date  11/28/19      PT LONG TERM GOAL #5   Title  Complete community, work and/or recreational activities without limitation due to current condition    Baseline  limited in lifting, reaching, housecleaning/vaccuming, quality of life (08/22/2019); conitnues to report  similar limitations (10/17/2019);    Time  6    Period  Weeks    Status  On-going    Target Date  11/28/19            Plan - 10/17/19 1504    Clinical Impression Statement  Patient has attended 9 physical therapy treatment session this episode of care and demonstrates progress towards several goals at this point. Subjectively, she reports minimal improvement in her pain or what bothers her and feels that her shoulder continues to be disruptive of her function, especially for reaching back and sleeping. She also continues to have difficulty at her neck but feels that this has improved some at first but now her shoulder is bothering her more. She had difficulty with rating difficulty level with patient specific functional scale, but overall it was slightly worsened (5 from 5.7) on average from initial evaluation. Objectively, she demonstrate improvements in cervical spine AROM, freedom/confidcence in movement, UE strength, and pain with activities in the clinic. Her R shoulder was further assessed today for intra-articular symptoms and she was better able to separate some underlying tension and pain in the cervical spine region when  reporting her symptoms during special tests and ROM/MMT. Findings of negative concordant sign with cervical spine testing and positive concordant findings with shoulder testing suggest she would benefit from further physical therapy with more targeted treatments to the R shoulder that may improve pain in the shoulder and cervical spine and help patient alleviate her symptoms and return to PLOF. Plan to continue physical therapy for 6 more weeks with increased focus on R shoulder as well as continuing with interventions for cervical spine. Patient presents with continued pain, fear avoidance, decreased activity tolerance, muscle performance (strength/endurance/power), joint stiffness, muscle spasm impairments that are limiting ability to complete her usual activities in  including reaching, cleaning, lifting, vaccuming without difficulty and it is negatively impacting her quality of life. Patient would benefit from continued management of limiting condition by skilled physical therapist to address remaining impairments and functional limitations to work towards stated goals and return to PLOF or maximal functional independence.    Personal Factors and Comorbidities  Age;Comorbidity 3+;Behavior Pattern;Past/Current Experience;Fitness;Time since onset of injury/illness/exacerbation;Profession    Comorbidities  anxiety, major depression (currently managed with medication to patient's satisfaction by psychiatrist), fibromyalgia, ADHD, psoriasis, plantar fasciitis, chronic back pain, chronic iritis (currently being managed).    Examination-Activity Limitations  Carry;Reach Overhead;Other;Lift;Hygiene/Grooming;Dressing;Caring for Others;Bathing   reaching   Examination-Participation Restrictions  Cleaning;Community Activity;Driving;Interpersonal Relationship;Other   vaccuming   Stability/Clinical Decision Making  Evolving/Moderate complexity    Rehab Potential  Fair    PT Frequency  2x / week    PT Duration  6 weeks    PT Treatment/Interventions  ADLs/Self Care Home Management;Aquatic Therapy;Cryotherapy;Moist Heat;Therapeutic activities;Therapeutic exercise;Neuromuscular re-education;Cognitive remediation;Patient/family education;Manual techniques;Passive range of motion;Dry needling;Spinal Manipulations;Joint Manipulations    PT Next Visit Plan  continue with strengthening and stretching as tolerated    PT Home Exercise Plan  [place] Access Code: [email or web address]    Consulted and Agree with Plan of Care  Patient       Patient will benefit from skilled therapeutic intervention in order to improve the following deficits and impairments:  Decreased endurance, Decreased mobility, Hypomobility, Increased muscle spasms, Improper body mechanics, Impaired perceived functional  ability, Decreased range of motion, Decreased activity tolerance, Decreased strength, Impaired flexibility, Impaired UE functional use, Pain  Visit Diagnosis: Cervicalgia  Chronic right shoulder pain  Muscle weakness (generalized)     Problem List Patient Active Problem List   Diagnosis Date Noted  . Plantar fasciitis 07/09/2014    Everlean Alstrom. Graylon Good, PT, DPT 10/17/19, 3:08 PM  [facility] [address] [place], [address] Phone: [phone]   Fax:  [phone]  Name: Cheryl Schroeder MRN: [id number] Date of Birth: 04-04-1956

## 2019-10-19 ENCOUNTER — Ambulatory Visit: Payer: Medicare HMO | Admitting: Physical Therapy

## 2019-10-25 ENCOUNTER — Other Ambulatory Visit: Payer: Self-pay

## 2019-10-25 ENCOUNTER — Ambulatory Visit: Payer: Medicare HMO | Admitting: Physical Therapy

## 2019-10-25 DIAGNOSIS — M542 Cervicalgia: Secondary | ICD-10-CM

## 2019-10-25 DIAGNOSIS — G8929 Other chronic pain: Secondary | ICD-10-CM

## 2019-10-25 DIAGNOSIS — M6281 Muscle weakness (generalized): Secondary | ICD-10-CM

## 2019-10-25 NOTE — Therapy (Signed)
[facility] [address] [place], [address] Phone: [phone]   Fax:  [phone]  Physical Therapy Treatment / Progress Note Reporting period: 08/22/2019 - 10/25/2019 Patient Details  Name: Cheryl Schroeder MRN: [id number] Date of Birth: [birth year] Referring Provider (PT): Lynnell Jude, MD   Encounter Date: 10/25/2019  PT End of Session - 10/25/19 1451    Visit Number  10    Number of Visits  24    Date for PT Re-Evaluation  11/28/19    Authorization Type  [employer] reporting period from 08/22/2019    Authorization - Visit Number  10    Authorization - Number of Visits  10    PT Start Time  1300    PT Stop Time  1345    PT Time Calculation (min)  45 min    Activity Tolerance  Patient tolerated treatment well    Behavior During Therapy  [facility] for tasks assessed/performed       Past Medical History:  Diagnosis Date  . Bipolar 2 disorder ([place])   . Fibromyalgia   . Psoriasis     Past Surgical History:  Procedure Laterality Date  . ABDOMINAL HYSTERECTOMY    . ADENOIDECTOMY    . BREAST BIOPSY Right 1990   cyst  . TONSILLECTOMY      There were no vitals filed for this visit.  Subjective Assessment - 10/25/19 1258    Subjective  Patient reports she has been having a lot of anxiety that is disrupting her sleep. States her shoulder is feeling usual today. States no change following last treatment session. States her pain is 7/10 near the right UT upon arrival.    Pertinent History  Patient is a 64 y.o. female who presents to outpatient physical therapy with a referral for medical diagnosis cervicalgia. This patient's chief complaints consist of right-sided neck, shoulder, and arm pain , leading to the following functional deficits: difficulty reaching, lifting, cleaning house/vaccuming, decreased quality of life. Relevant past medical history and comorbidities include anxiety, major depression  (currently managed with medication to patient's satisfaction by psychiatrist), fibromyalgia, ADHD, psoriasis, plantar fasciitis, chronic back pain, chronic iritis (currently being managed).  States that her psychiatrists believes she has major depression instead of bipolar 2    Limitations  Other (comment);House hold activities;Standing;Walking   reaching Back to the Right a certain way (back seat in the car).   Diagnostic tests  No recent imaging of the spine    Patient Stated Goals  Wants to strengthen the muscles around her R upper trap.    Currently in Pain?  Yes    Pain Score  7     Pain Location  Shoulder    Pain Orientation  Right    Pain Onset  More than a month ago         TREATMENT: Denies sensitivity to latex  Therapeutic exercise:to centralize symptoms and improve ROM, strength, muscular endurance, and activity tolerance required for successful completion of functional activities.  Completed as a circuit:  - Standing rows with scapular retraction for improved postural and shoulder girdle strengthening and mobility. Required instruction for technique and cuing to retract, posteriorly tilt, and depress scapulae. 3x10. Green theraband. - standing shoulder extension with scapular retraction. 3x10 red theraband anchored overhead.  - Standing right shoulder internal rotation with towel bolster under arm with green theraband 3x10. No discomfort first set, sharp pain at posterior shoulder 2nd/3rd set that got  better with repositioning. Red band used last set.  - Standing right shoulder external rotation with towel bolster under arm with yellow theraband 3x10 with no discomfort. Red band last set, but fatigued quickly.  - standing shoulder shrugs at 30 degrees abduction for UT strengthening, 3x10 with 3# DB  Standing shoulder circles on green ball with L shoulder  - alphabet in 30 degrees flexion, pressing through - x26 circles clockwise at 90 degrees flexion.   Supine small  shoulder circle at 90 degrees flexion with 3# DB x 20 clockwise.   Discussed HEP and decided to add new exercises next session.   Manual therapy:to reduce pain and tissue tension, improve range of motion, neuromodulation, in order to promote improved ability to complete functional activities. -PROM of R shoulder x10-20 in flexion, Abduction, IR, ER as tolerated.  - Accessory motion assessment at R [facility] joint including grade II-IV joint mobilizations AP, caudal, and distraction to decrease pain. - palpation and gentle STM at R shoulder to decrease pain and tension. Very tender at posterior shoulder initially.   Pt required multimodal cuing for proper technique and to facilitate improved neuromuscular control, strength, range of motion, and functional ability resulting in improved performance and form.  HOME EXERCISE PROGRAM Access Code: [email or web address]      URL: [email or web address]    Date: 10/17/2019  Prepared by: Rosita Kea   Exercises Circular Shoulder Pendulum with Table Support - 3-55 minutes - 3x daily Supine Shoulder Horizontal Abduction with Resistance - 2 sets - 20 reps - 1 second hold - 1x daily Supine PNF D2 Flexion with Resistance - 2 sets - 20 reps - 1 second hold - 1x daily Supine Head Nod Deep Neck Flexor Training - 20 reps - 5 seconds hold - 1x daily   PT Education - 10/25/19 1451    Education Details  Exercise purpose/form. Self management techniques    Person(s) Educated  Patient    Methods  Explanation;Demonstration;Tactile cues;Verbal cues    Comprehension  Verbalized understanding;Returned demonstration;Verbal cues required;Need further instruction;Tactile cues required       PT Short Term Goals - 09/07/19 1439      PT SHORT TERM GOAL #1   Title  Be independent with initial home exercise program for self-management of symptoms.    Baseline  Initial HEP provided at IE (08/22/2019);    Time  3    Period  Weeks    Status  Achieved    Target Date   09/13/19        PT Long Term Goals - 10/17/19 1505      PT LONG TERM GOAL #1   Title  Be independent with a long-term home exercise program for self-management of symptoms    Baseline  Initial HEP provided at IE (08/22/2019); has not yet received final long term HEP (10/17/2019);    Time  6    Period  Weeks    Status  Partially Met    Target Date  11/28/19      PT LONG TERM GOAL #2   Title  Improve patient specific functional scale to equal or greater than 8/10 to demonstrate improved self-reported function.    Baseline  5.7/10 (08/22/2019); 5/10 (10/17/2019);    Time  6    Period  Weeks    Status  On-going    Target Date  11/28/19      PT LONG TERM GOAL #3   Title  Reduce pain with functional activities to equal or less than  3/10 to allow patient to complete usual activities including ADLs, IADLs, and social engagement with less difficulty.    Baseline  8/10 (08/22/2019); 8/10 (10/17/2019);    Time  6    Period  Weeks    Status  On-going    Target Date  11/28/19      PT LONG TERM GOAL #4   Title  Patient will demonstrate equal or greater than 60 degrees cervical spine rotation each way to improve ability to check blind spot while driving.    Baseline  Rotation: R= 48, L = 38. Both painful at R neck.  (08/22/2019); Rotation: R= 52, L = 30. Both painful at R neck.(10/17/2019);    Time  6    Period  Weeks    Status  On-going    Target Date  11/28/19      PT LONG TERM GOAL #5   Title  Complete community, work and/or recreational activities without limitation due to current condition    Baseline  limited in lifting, reaching, housecleaning/vaccuming, quality of life (08/22/2019); conitnues to report similar limitations (10/17/2019);    Time  6    Period  Weeks    Status  On-going    Target Date  11/28/19            Plan - 10/25/19 1449    Clinical Impression Statement  Patient tolerated treatment well overall with report of increased pain by end of session. Pain did not  come down and patient reported she could tell she used the shoulder. Did have some difficulty with R shoulder IR due to pain at posterior shoulder but was able to reposition and decrease resistance and continue. Patient would like exercises added to HEP next session if possible. Patient has attended 10 physical therapy treatment session this episode of care and demonstrates progress towards several goals at this point. Subjectively, she reports minimal improvement in her pain or what bothers her and feels that her shoulder continues to be disruptive of her function, especially for reaching back and sleeping. She also continues to have difficulty at her neck but feels that this has improved some at first but now her shoulder is bothering her more. She had difficulty with rating difficulty level with patient specific functional scale, but overall it was slightly worsened (5 from 5.7) on average from initial evaluation. Objectively, she demonstrate improvements in cervical spine AROM, freedom/confidcence in movement, UE strength, and pain with activities in the clinic. Her R shoulder was further assessed last session for intra-articular symptoms and she was better able to separate some underlying tension and pain in the cervical spine region when reporting her symptoms during special tests and ROM/MMT. Findings of negative concordant sign with cervical spine testing and positive concordant findings with shoulder testing suggest she would benefit from further physical therapy with more targeted treatments to the R shoulder that may improve pain in the shoulder and cervical spine and help patient alleviate her symptoms and return to PLOF. Plan to continue physical therapy with increased focus on R shoulder as well as continuing with interventions for cervical spine. Patient presents with continued pain, fear avoidance, decreased activity tolerance, muscle performance (strength/endurance/power), joint stiffness, muscle  spasm impairments that are limiting ability to complete her usual activities in including reaching, cleaning, lifting, vaccuming without difficulty and it is negatively impacting her quality of life. Patient would benefit from continued management of limiting condition by skilled physical therapist to address remaining impairments and functional limitations to work towards  stated goals and return to PLOF or maximal functional independence.    Personal Factors and Comorbidities  Age;Comorbidity 3+;Behavior Pattern;Past/Current Experience;Fitness;Time since onset of injury/illness/exacerbation;Profession    Comorbidities  anxiety, major depression (currently managed with medication to patient's satisfaction by psychiatrist), fibromyalgia, ADHD, psoriasis, plantar fasciitis, chronic back pain, chronic iritis (currently being managed).    Examination-Activity Limitations  Carry;Reach Overhead;Other;Lift;Hygiene/Grooming;Dressing;Caring for Others;Bathing   reaching   Examination-Participation Restrictions  Cleaning;Community Activity;Driving;Interpersonal Relationship;Other   vaccuming   Stability/Clinical Decision Making  Evolving/Moderate complexity    Rehab Potential  Fair    PT Frequency  2x / week    PT Duration  6 weeks    PT Treatment/Interventions  ADLs/Self Care Home Management;Aquatic Therapy;Cryotherapy;Moist Heat;Therapeutic activities;Therapeutic exercise;Neuromuscular re-education;Cognitive remediation;Patient/family education;Manual techniques;Passive range of motion;Dry needling;Spinal Manipulations;Joint Manipulations    PT Next Visit Plan  continue with strengthening and stretching as tolerated    PT Home Exercise Plan  [place] Access Code: [email or web address]    Consulted and Agree with Plan of Care  Patient       Patient will benefit from skilled therapeutic intervention in order to improve the following deficits and impairments:  Decreased endurance, Decreased mobility, Hypomobility,  Increased muscle spasms, Improper body mechanics, Impaired perceived functional ability, Decreased range of motion, Decreased activity tolerance, Decreased strength, Impaired flexibility, Impaired UE functional use, Pain  Visit Diagnosis: Cervicalgia  Chronic right shoulder pain  Muscle weakness (generalized)     Problem List Patient Active Problem List   Diagnosis Date Noted  . Plantar fasciitis 07/09/2014    Everlean Alstrom. Graylon Good, PT, DPT 10/25/19, 2:53 PM  [facility] [address], [place], [address] Phone: [phone]   Fax:  [phone]  Name: Cheryl Schroeder MRN: [id number] Date of Birth: 14-Feb-1956

## 2019-10-27 ENCOUNTER — Encounter: Payer: Self-pay | Admitting: Physical Therapy

## 2019-10-27 ENCOUNTER — Ambulatory Visit: Payer: Medicare HMO | Admitting: Physical Therapy

## 2019-10-27 ENCOUNTER — Other Ambulatory Visit: Payer: Self-pay

## 2019-10-27 DIAGNOSIS — M542 Cervicalgia: Secondary | ICD-10-CM

## 2019-10-27 DIAGNOSIS — G8929 Other chronic pain: Secondary | ICD-10-CM

## 2019-10-27 DIAGNOSIS — M6281 Muscle weakness (generalized): Secondary | ICD-10-CM

## 2019-10-27 NOTE — Therapy (Signed)
[facility] [address] [place], [address] Phone: [phone]   Fax:  [phone]  Physical Therapy Treatment  Patient Details  Name: Cheryl Schroeder MRN: [id number] Date of Birth: July 25, 1956 Referring Provider (PT): Lynnell Jude, MD   Encounter Date: 10/27/2019  PT End of Session - 10/27/19 1436    Visit Number  11    Number of Visits  24    Date for PT Re-Evaluation  11/28/19    Authorization Type  [employer] Medicare reporting period from 10/27/2019    Authorization - Visit Number  1    Authorization - Number of Visits  10    PT Start Time  9211    PT Stop Time  1425    PT Time Calculation (min)  40 min    Activity Tolerance  Patient tolerated treatment well    Behavior During Therapy  [facility] for tasks assessed/performed       Past Medical History:  Diagnosis Date  . Bipolar 2 disorder ([place])   . Fibromyalgia   . Psoriasis     Past Surgical History:  Procedure Laterality Date  . ABDOMINAL HYSTERECTOMY    . ADENOIDECTOMY    . BREAST BIOPSY Right 1990   cyst  . TONSILLECTOMY      There were no vitals filed for this visit.  Subjective Assessment - 10/27/19 1355    Subjective  Patient reports she has been in a lot of pain since last session and feels like she overdid it. She said she was feeling pain while she was doing the exercises but did not want to appear as a "wimp" and really wanted to do the exercise, but later she realized she overdid it. She has not done any of her HEP since last session due to the pain.    Pertinent History  Patient is a 64 y.o. female who presents to outpatient physical therapy with a referral for medical diagnosis cervicalgia. This patient's chief complaints consist of right-sided neck, shoulder, and arm pain , leading to the following functional deficits: difficulty reaching, lifting, cleaning house/vaccuming, decreased quality of life. Relevant past medical history and  comorbidities include anxiety, major depression (currently managed with medication to patient's satisfaction by psychiatrist), fibromyalgia, ADHD, psoriasis, plantar fasciitis, chronic back pain, chronic iritis (currently being managed).  States that her psychiatrists believes she has major depression instead of bipolar 2    Limitations  Other (comment);House hold activities;Standing;Walking   reaching Back to the Right a certain way (back seat in the car).   Diagnostic tests  No recent imaging of the spine    Patient Stated Goals  Wants to strengthen the muscles around her R upper trap.    Currently in Pain?  Yes    Pain Score  7     Pain Location  Shoulder    Pain Orientation  Right    Pain Descriptors / Indicators  Aching    Pain Type  Chronic pain    Pain Onset  More than a month ago       TREATMENT: Denies sensitivity to latex  Therapeutic exercise:to centralize symptoms and improve ROM, strength, muscular endurance, and activity tolerance required for successful completion of functional activities.  - supine horizontal abduction with yellow theraband3x10with cuing to depress and retract scapulae.  - supine D2 shoulder flexion L side only using yellow theraband,3x10each side. Cuing to depress and retract scapulae while relaxing neck. - attempted R shoulder ER  in supine with yellow theraband but unable due to pain. - Standing right shoulder internal rotation with towel bolster under arm with yellow theraband 2x10.  - Standing right shoulder external rotation with towel bolster under arm with yellow theraband x10 with no discomfort.  - Standing rows with scapular retraction for improved postural and shoulder girdle strengthening and mobility. Required instruction for technique and cuing to retract, posteriorly tilt, and depress scapulae. 2x10. yellow theraband. - standing shoulder extension with scapular retraction. 2x10 yellow theraband anchored overhead.   Discussed HEP and  continue with supine exercise only  Manual therapy:to reduce pain and tissue tension, improve range of motion, neuromodulation, in order to promote improved ability to complete functional activities. -PROM ofRshoulder x10 in flexion,, IR, ER as tolerated.  - Accessory motion assessment [facility] joint including grade II-IVjoint mobilizations AP, caudal, and distraction to decrease pain. - palpation and gentle STM at R shoulder, posterior cervical spine musculature, and R UT to decrease pain and tension.  Pt required multimodal cuing for proper technique and to facilitate improved neuromuscular control, strength, range of motion, and functional ability resulting in improved performance and form.  HOME EXERCISE PROGRAM Access Code: [email or web address] URL: [email or web address] Date: 10/17/2019  Prepared by: Rosita Kea   Exercises Circular Shoulder Pendulum with Table Support - 3-55 minutes - 3x daily Supine Shoulder Horizontal Abduction with Resistance - 2 sets - 20 reps - 1 second hold - 1x daily Supine PNF D2 Flexion with Resistance - 2 sets - 20 reps - 1 second hold - 1x daily Supine Head Nod Deep Neck Flexor Training - 20 reps - 5 seconds hold - 1x daily    PT Education - 10/27/19 1436    Education Details  Exercise purpose/form. Self management techniques. pain science. HEP    Person(s) Educated  Patient    Methods  Explanation;Demonstration;Tactile cues;Verbal cues    Comprehension  Verbalized understanding;Returned demonstration;Verbal cues required;Need further instruction;Tactile cues required       PT Short Term Goals - 09/07/19 1439      PT SHORT TERM GOAL #1   Title  Be independent with initial home exercise program for self-management of symptoms.    Baseline  Initial HEP provided at IE (08/22/2019);    Time  3    Period  Weeks    Status  Achieved    Target Date  09/13/19        PT Long Term Goals - 10/17/19 1505      PT LONG TERM GOAL  #1   Title  Be independent with a long-term home exercise program for self-management of symptoms    Baseline  Initial HEP provided at IE (08/22/2019); has not yet received final long term HEP (10/17/2019);    Time  6    Period  Weeks    Status  Partially Met    Target Date  11/28/19      PT LONG TERM GOAL #2   Title  Improve patient specific functional scale to equal or greater than 8/10 to demonstrate improved self-reported function.    Baseline  5.7/10 (08/22/2019); 5/10 (10/17/2019);    Time  6    Period  Weeks    Status  On-going    Target Date  11/28/19      PT LONG TERM GOAL #3   Title  Reduce pain with functional activities to equal or less than 3/10 to allow patient to complete usual activities including ADLs, IADLs, and social engagement with less difficulty.  Baseline  8/10 (08/22/2019); 8/10 (10/17/2019);    Time  6    Period  Weeks    Status  On-going    Target Date  11/28/19      PT LONG TERM GOAL #4   Title  Patient will demonstrate equal or greater than 60 degrees cervical spine rotation each way to improve ability to check blind spot while driving.    Baseline  Rotation: R= 48, L = 38. Both painful at R neck.  (08/22/2019); Rotation: R= 52, L = 30. Both painful at R neck.(10/17/2019);    Time  6    Period  Weeks    Status  On-going    Target Date  11/28/19      PT LONG TERM GOAL #5   Title  Complete community, work and/or recreational activities without limitation due to current condition    Baseline  limited in lifting, reaching, housecleaning/vaccuming, quality of life (08/22/2019); conitnues to report similar limitations (10/17/2019);    Time  6    Period  Weeks    Status  On-going    Target Date  11/28/19            Plan - 10/27/19 1436    Clinical Impression Statement  Patient tolerated treatment well overall and reported no increase in pain by end of session. States she does not feel significant decrease in pain either. No reports of increased pain  during exercises except supine R shoulder ER that was discontinued immediately. Exercise reps, resistance, and amount decreases due to poor response last session. Patient continues to be motivated to do more exercises for her shoulder. Patient would benefit from continued management of limiting condition by skilled physical therapist to address remaining impairments and functional limitations to work towards stated goals and return to PLOF or maximal functional independence.    Personal Factors and Comorbidities  Age;Comorbidity 3+;Behavior Pattern;Past/Current Experience;Fitness;Time since onset of injury/illness/exacerbation;Profession    Comorbidities  anxiety, major depression (currently managed with medication to patient's satisfaction by psychiatrist), fibromyalgia, ADHD, psoriasis, plantar fasciitis, chronic back pain, chronic iritis (currently being managed).    Examination-Activity Limitations  Carry;Reach Overhead;Other;Lift;Hygiene/Grooming;Dressing;Caring for Others;Bathing   reaching   Examination-Participation Restrictions  Cleaning;Community Activity;Driving;Interpersonal Relationship;Other   vaccuming   Stability/Clinical Decision Making  Evolving/Moderate complexity    Rehab Potential  Fair    PT Frequency  2x / week    PT Duration  6 weeks    PT Treatment/Interventions  ADLs/Self Care Home Management;Aquatic Therapy;Cryotherapy;Moist Heat;Therapeutic activities;Therapeutic exercise;Neuromuscular re-education;Cognitive remediation;Patient/family education;Manual techniques;Passive range of motion;Dry needling;Spinal Manipulations;Joint Manipulations    PT Next Visit Plan  continue with strengthening and stretching as tolerated    PT Home Exercise Plan  [place] Access Code: [email or web address]    Consulted and Agree with Plan of Care  Patient       Patient will benefit from skilled therapeutic intervention in order to improve the following deficits and impairments:  Decreased endurance,  Decreased mobility, Hypomobility, Increased muscle spasms, Improper body mechanics, Impaired perceived functional ability, Decreased range of motion, Decreased activity tolerance, Decreased strength, Impaired flexibility, Impaired UE functional use, Pain  Visit Diagnosis: Cervicalgia  Chronic right shoulder pain  Muscle weakness (generalized)     Problem List Patient Active Problem List   Diagnosis Date Noted  . Plantar fasciitis 07/09/2014    Everlean Alstrom. Graylon Good, PT, DPT 10/27/19, 2:38 PM  [facility] [address], [place], [address] Phone: [phone]  Fax:  [phone]  Name: TERESIA MYINT MRN: [id number] Date of Birth: 06/06/1956

## 2019-11-01 ENCOUNTER — Ambulatory Visit: Payer: Medicare HMO | Admitting: Physical Therapy

## 2019-11-03 ENCOUNTER — Encounter: Payer: Self-pay | Admitting: Physical Therapy

## 2019-11-03 ENCOUNTER — Ambulatory Visit: Payer: Medicare HMO | Admitting: Physical Therapy

## 2019-11-03 ENCOUNTER — Other Ambulatory Visit: Payer: Self-pay

## 2019-11-03 DIAGNOSIS — M6281 Muscle weakness (generalized): Secondary | ICD-10-CM

## 2019-11-03 DIAGNOSIS — G8929 Other chronic pain: Secondary | ICD-10-CM

## 2019-11-03 DIAGNOSIS — M542 Cervicalgia: Secondary | ICD-10-CM

## 2019-11-03 NOTE — Therapy (Signed)
[facility] [address] [place], [address] Phone: [phone]   Fax:  [phone]  Physical Therapy Treatment  Patient Details  Name: Cheryl Schroeder MRN: [id number] Date of Birth: [birth year] Referring Provider (PT): Lynnell Jude, MD   Encounter Date: 11/03/2019  PT End of Session - 11/03/19 1356    Visit Number  12    Number of Visits  24    Date for PT Re-Evaluation  11/28/19    Authorization Type  [employer] reporting period from 10/27/2019    Authorization - Visit Number  2    Authorization - Number of Visits  10    PT Start Time  1351    PT Stop Time  1430    PT Time Calculation (min)  39 min    Activity Tolerance  Patient tolerated treatment well;Patient limited by pain    Behavior During Therapy  [facility] for tasks assessed/performed       Past Medical History:  Diagnosis Date  . Bipolar 2 disorder ([place])   . Fibromyalgia   . Psoriasis     Past Surgical History:  Procedure Laterality Date  . ABDOMINAL HYSTERECTOMY    . ADENOIDECTOMY    . BREAST BIOPSY Right 1990   cyst  . TONSILLECTOMY      There were no vitals filed for this visit.  Subjective Assessment - 11/03/19 1351    Subjective  Patient reports she has been feeling poorly with increased pain that seems related to her sinuses, fibromyalgia, and ends up hurting in her shoulder and her upper quarter and headaches. Most of the time the pain will go away if she doesn't use her R arm. But it comes back if she uses it. She rates her pain 8/10 in her R shoulder, neck, and head. She notices she tenses her upper traps when she hurts and continues to tell herself to relax. She has been unable to do her HEP. She cleaned up the house and she had to clean both bathrooms, vaccumed the great room, and cleaned the kitchen which is more than she usually doesn't do all at once and that made her low back, upper back, sinuses, shoulder and head. She  cleaned on Sunday. It was better after she took lots of pain medication and it has been sore since then.    Pertinent History  Patient is a 64 y.o. female who presents to outpatient physical therapy with a referral for medical diagnosis cervicalgia. This patient's chief complaints consist of right-sided neck, shoulder, and arm pain , leading to the following functional deficits: difficulty reaching, lifting, cleaning house/vaccuming, decreased quality of life. Relevant past medical history and comorbidities include anxiety, major depression (currently managed with medication to patient's satisfaction by psychiatrist), fibromyalgia, ADHD, psoriasis, plantar fasciitis, chronic back pain, chronic iritis (currently being managed).  States that her psychiatrists believes she has major depression instead of bipolar 2    Limitations  Other (comment);House hold activities;Standing;Walking   reaching Back to the Right a certain way (back seat in the car).   Diagnostic tests  No recent imaging of the spine    Patient Stated Goals  Wants to strengthen the muscles around her R upper trap.    Currently in Pain?  Yes    Pain Score  8     Pain Location  Shoulder    Pain Orientation  Right    Pain Onset  More than a month ago  TREATMENT: Denies sensitivity to latex  Therapeutic exercise:to centralize symptoms and improve ROM, strength, muscular endurance, and activity tolerance required for successful completion of functional activities. - right first rib self mobilization / scalene stretch with towel over shoulder 2x30 seconds.  - Education on HEP including handout   Manual therapy:to reduce pain and tissue tension, improve range of motion, neuromodulation, in order to promote improved ability to complete functional activities. -PROM ofRshoulderx 7 in flexion - Accessory motion assessment atRGH joint including grade II-IVjoint mobilizations AP, caudal, and distraction to decrease pain. -  palpation and gentle STM at R shoulder including subscapularis (concordant pain), infraspinatus and anterior shoulder musculature, posterior cervical spine musculature, and R UT to decrease pain and tension. - cervical spine manual distraction to tolerance intermixed with STM to cervical spine.  - seated first rib MET with manual pressure anterior and inferior on R first rib just anterior to R upper trap through several breathing cycles. Uncomfortable and discolored skin slightly where thumb pressing, but patient reported improved mobility of the R shoulder following.  - STM to right sided scalenes (tender).   Pt required multimodal cuing for proper technique and to facilitate improved neuromuscular control, strength, range of motion, and functional ability resulting in improved performance and form.  HOME EXERCISE PROGRAM Access Code: [email or web address]  URL: [email or web address]  Date: 11/03/2019  Prepared by: Rosita Kea   Exercises Circular Shoulder Pendulum with Table Support - 3-55 minutes - 3x daily Supine Shoulder Horizontal Abduction with Resistance - 2 sets - 20 reps - 1 second hold - 1x daily Supine PNF D2 Flexion with Resistance - 2 sets - 20 reps - 1 second hold - 1x daily Supine Head Nod Deep Neck Flexor Training - 20 reps - 5 seconds hold - 1x daily First Rib Mobilization with Strap - 10 reps - 10 seconds hold - 2x daily    PT Education - 11/03/19 1542    Education Details  Exercise purpose/form. Self management techniques. HEP    Person(s) Educated  Patient    Methods  Explanation;Demonstration;Tactile cues;Verbal cues;Handout    Comprehension  Verbalized understanding;Returned demonstration;Verbal cues required;Need further instruction;Tactile cues required       PT Short Term Goals - 09/07/19 1439      PT SHORT TERM GOAL #1   Title  Be independent with initial home exercise program for self-management of symptoms.    Baseline  Initial HEP provided at IE  (08/22/2019);    Time  3    Period  Weeks    Status  Achieved    Target Date  09/13/19        PT Long Term Goals - 10/17/19 1505      PT LONG TERM GOAL #1   Title  Be independent with a long-term home exercise program for self-management of symptoms    Baseline  Initial HEP provided at IE (08/22/2019); has not yet received final long term HEP (10/17/2019);    Time  6    Period  Weeks    Status  Partially Met    Target Date  11/28/19      PT LONG TERM GOAL #2   Title  Improve patient specific functional scale to equal or greater than 8/10 to demonstrate improved self-reported function.    Baseline  5.7/10 (08/22/2019); 5/10 (10/17/2019);    Time  6    Period  Weeks    Status  On-going    Target Date  11/28/19      PT  LONG TERM GOAL #3   Title  Reduce pain with functional activities to equal or less than 3/10 to allow patient to complete usual activities including ADLs, IADLs, and social engagement with less difficulty.    Baseline  8/10 (08/22/2019); 8/10 (10/17/2019);    Time  6    Period  Weeks    Status  On-going    Target Date  11/28/19      PT LONG TERM GOAL #4   Title  Patient will demonstrate equal or greater than 60 degrees cervical spine rotation each way to improve ability to check blind spot while driving.    Baseline  Rotation: R= 48, L = 38. Both painful at R neck.  (08/22/2019); Rotation: R= 52, L = 30. Both painful at R neck.(10/17/2019);    Time  6    Period  Weeks    Status  On-going    Target Date  11/28/19      PT LONG TERM GOAL #5   Title  Complete community, work and/or recreational activities without limitation due to current condition    Baseline  limited in lifting, reaching, housecleaning/vaccuming, quality of life (08/22/2019); conitnues to report similar limitations (10/17/2019);    Time  6    Period  Weeks    Status  On-going    Target Date  11/28/19            Plan - 11/03/19 1553    Clinical Impression Statement  Patient arrives today  with high levels of pain that may be related to underlying pain conditions but continues to affect her at the treatment area including right shoulder. Pain control focus of today's session. Patient pain pattern and continual tightness in the cervical muscles may be elevating right first rib, which was more elevated than left to palpation. MET for first rib attempted and although uncomfortable during procedure seemed to improve pain in the shoulder directly following. Updated HEP with self-mobilization/stretch to this area for home follow up. Patient would benefit from continued management of limiting condition by skilled physical therapist to address remaining impairments and functional limitations to work towards stated goals and return to PLOF or maximal functional independence.    Personal Factors and Comorbidities  Age;Comorbidity 3+;Behavior Pattern;Past/Current Experience;Fitness;Time since onset of injury/illness/exacerbation;Profession    Comorbidities  anxiety, major depression (currently managed with medication to patient's satisfaction by psychiatrist), fibromyalgia, ADHD, psoriasis, plantar fasciitis, chronic back pain, chronic iritis (currently being managed).    Examination-Activity Limitations  Carry;Reach Overhead;Other;Lift;Hygiene/Grooming;Dressing;Caring for Others;Bathing   reaching   Examination-Participation Restrictions  Cleaning;Community Activity;Driving;Interpersonal Relationship;Other   vaccuming   Stability/Clinical Decision Making  Evolving/Moderate complexity    Rehab Potential  Fair    PT Frequency  2x / week    PT Duration  6 weeks    PT Treatment/Interventions  ADLs/Self Care Home Management;Aquatic Therapy;Cryotherapy;Moist Heat;Therapeutic activities;Therapeutic exercise;Neuromuscular re-education;Cognitive remediation;Patient/family education;Manual techniques;Passive range of motion;Dry needling;Spinal Manipulations;Joint Manipulations    PT Next Visit Plan  continue  with strengthening and stretching as tolerated    PT Home Exercise Plan  [place] Access Code: [email or web address]    Consulted and Agree with Plan of Care  Patient       Patient will benefit from skilled therapeutic intervention in order to improve the following deficits and impairments:  Decreased endurance, Decreased mobility, Hypomobility, Increased muscle spasms, Improper body mechanics, Impaired perceived functional ability, Decreased range of motion, Decreased activity tolerance, Decreased strength, Impaired flexibility, Impaired UE functional use, Pain  Visit Diagnosis: Cervicalgia  Chronic right shoulder pain  Muscle weakness (generalized)     Problem List Patient Active Problem List   Diagnosis Date Noted  . Plantar fasciitis 07/09/2014    Everlean Alstrom. Graylon Good, PT, DPT 11/03/19, 3:54 PM  [facility] [address], [place], [address] Phone: [phone]   Fax:  [phone]  Name: Cheryl Schroeder MRN: [id number] Date of Birth: 1955-10-08

## 2019-11-08 ENCOUNTER — Other Ambulatory Visit: Payer: Self-pay

## 2019-11-08 ENCOUNTER — Ambulatory Visit: Payer: Medicare HMO | Attending: Family Medicine | Admitting: Physical Therapy

## 2019-11-08 ENCOUNTER — Encounter: Payer: Self-pay | Admitting: Physical Therapy

## 2019-11-08 DIAGNOSIS — M25511 Pain in right shoulder: Secondary | ICD-10-CM | POA: Insufficient documentation

## 2019-11-08 DIAGNOSIS — M6281 Muscle weakness (generalized): Secondary | ICD-10-CM | POA: Diagnosis present

## 2019-11-08 DIAGNOSIS — G8929 Other chronic pain: Secondary | ICD-10-CM

## 2019-11-08 DIAGNOSIS — M542 Cervicalgia: Secondary | ICD-10-CM

## 2019-11-08 NOTE — Therapy (Signed)
[facility] [address], [place], [address] Phone: [phone]   Fax:  [phone]  Physical Therapy Treatment  Patient Details  Name: Cheryl Schroeder MRN: [id number] Date of Birth: [birth year] Referring Provider (PT): Lynnell Jude, MD   Encounter Date: 11/08/2019  PT End of Session - 11/08/19 1936    Visit Number  13    Number of Visits  24    Date for PT Re-Evaluation  11/28/19    Authorization Type  [employer] reporting period from 10/27/2019    Authorization - Visit Number  3    Authorization - Number of Visits  10    PT Start Time  1302    PT Stop Time  1342    PT Time Calculation (min)  40 min    Activity Tolerance  Patient tolerated treatment well;Patient limited by pain    Behavior During Therapy  [facility] for tasks assessed/performed       Past Medical History:  Diagnosis Date  . Bipolar 2 disorder ([place])   . Fibromyalgia   . Psoriasis     Past Surgical History:  Procedure Laterality Date  . ABDOMINAL HYSTERECTOMY    . ADENOIDECTOMY    . BREAST BIOPSY Right 1990   cyst  . TONSILLECTOMY      There were no vitals filed for this visit.  Subjective Assessment - 11/08/19 1934    Subjective  Patient reports she is doing okay today but continues to be sore everywhere with worse soreness in the R shoulder. Has not been doing her HEP. Had no excessive soreness following last treatment session and thinks the first rib MET may have helped.    Pertinent History  Patient is a 64 y.o. female who presents to outpatient physical therapy with a referral for medical diagnosis cervicalgia. This patient's chief complaints consist of right-sided neck, shoulder, and arm pain , leading to the following functional deficits: difficulty reaching, lifting, cleaning house/vaccuming, decreased quality of life. Relevant past medical history and comorbidities include anxiety, major depression (currently managed with  medication to patient's satisfaction by psychiatrist), fibromyalgia, ADHD, psoriasis, plantar fasciitis, chronic back pain, chronic iritis (currently being managed).  States that her psychiatrists believes she has major depression instead of bipolar 2    Limitations  Other (comment);House hold activities;Standing;Walking   reaching Back to the Right a certain way (back seat in the car).   Diagnostic tests  No recent imaging of the spine    Patient Stated Goals  Wants to strengthen the muscles around her R upper trap.    Currently in Pain?  Yes    Pain Score  6     Pain Location  Shoulder    Pain Orientation  Right    Pain Descriptors / Indicators  Aching    Pain Onset  More than a month ago       TREATMENT: Denies sensitivity to latex  Therapeutic exercise:to centralize symptoms and improve ROM, strength, muscular endurance, and activity tolerance required for successful completion of functional activities. - isometric R shoulder IR with towel roll bolster under arm, 5 second hold, x 10  -Standing dynamic isometric right shoulder internal rotation with towel bolster under arm with yellow [id number].  -Standing isometric dynamic right shoulder external rotation with towel bolster under arm with yellow therabandx10 withnodiscomfort.  -Standing rows with scapular retraction for improved postural and shoulder girdle strengthening and mobility. Required instruction for technique and cuing to retract,  posteriorly tilt, and depress scapulae.x10.green theraband. Patient now reports increased pain.    Manual therapy:to reduce pain and tissue tension, improve range of motion, neuromodulation, in order to promote improved ability to complete functional activities. - seated first rib MET with manual pressure anterior and inferior on R first rib just anterior to R upper trap through several breathing cycles.  - seated R first rib mobilization to pain tolerance to decrease pain - tender.   - supine joint mobilizations grade II-III at R [facility] joint including AP, caudal, and distraction to decrease pain.  - palpation and gentle STM at R shoulder including subscapularis (concordant pain), infraspinatus and anterior shoulder musculature, posterior cervical spine musculature, and R UTto decrease pain and tension. - cervical spine manual distraction to tolerance intermixed with STM to cervical spine, focusing on R scalenes.  - PROM stretch to right cervical spine musculature by combinations of left cervical spine flexion with head rotated each direction. Held to tolerance and tolerable intensity.   Pt required multimodal cuing for proper technique and to facilitate improved neuromuscular control, strength, range of motion, and functional ability resulting in improved performance and form.  HOME EXERCISE PROGRAM Access Code: [email or web address]      URL: [email or web address]    Date: 11/03/2019  Prepared by: Rosita Kea   Exercises Circular Shoulder Pendulum with Table Support - 3-55 minutes - 3x daily Supine Shoulder Horizontal Abduction with Resistance - 2 sets - 20 reps - 1 second hold - 1x daily Supine PNF D2 Flexion with Resistance - 2 sets - 20 reps - 1 second hold - 1x daily Supine Head Nod Deep Neck Flexor Training - 20 reps - 5 seconds hold - 1x daily First Rib Mobilization with Strap - 10 reps - 10 seconds hold -    PT Education - 11/08/19 1935    Education Details  Exercise purpose/form. Self management techniques.    Person(s) Educated  Patient    Methods  Explanation;Demonstration;Tactile cues;Verbal cues    Comprehension  Verbalized understanding;Returned demonstration;Verbal cues required;Tactile cues required;Need further instruction       PT Short Term Goals - 09/07/19 1439      PT SHORT TERM GOAL #1   Title  Be independent with initial home exercise program for self-management of symptoms.    Baseline  Initial HEP provided at IE (08/22/2019);     Time  3    Period  Weeks    Status  Achieved    Target Date  09/13/19        PT Long Term Goals - 10/17/19 1505      PT LONG TERM GOAL #1   Title  Be independent with a long-term home exercise program for self-management of symptoms    Baseline  Initial HEP provided at IE (08/22/2019); has not yet received final long term HEP (10/17/2019);    Time  6    Period  Weeks    Status  Partially Met    Target Date  11/28/19      PT LONG TERM GOAL #2   Title  Improve patient specific functional scale to equal or greater than 8/10 to demonstrate improved self-reported function.    Baseline  5.7/10 (08/22/2019); 5/10 (10/17/2019);    Time  6    Period  Weeks    Status  On-going    Target Date  11/28/19      PT LONG TERM GOAL #3   Title  Reduce pain with functional activities to equal or less  than 3/10 to allow patient to complete usual activities including ADLs, IADLs, and social engagement with less difficulty.    Baseline  8/10 (08/22/2019); 8/10 (10/17/2019);    Time  6    Period  Weeks    Status  On-going    Target Date  11/28/19      PT LONG TERM GOAL #4   Title  Patient will demonstrate equal or greater than 60 degrees cervical spine rotation each way to improve ability to check blind spot while driving.    Baseline  Rotation: R= 48, L = 38. Both painful at R neck.  (08/22/2019); Rotation: R= 52, L = 30. Both painful at R neck.(10/17/2019);    Time  6    Period  Weeks    Status  On-going    Target Date  11/28/19      PT LONG TERM GOAL #5   Title  Complete community, work and/or recreational activities without limitation due to current condition    Baseline  limited in lifting, reaching, housecleaning/vaccuming, quality of life (08/22/2019); conitnues to report similar limitations (10/17/2019);    Time  6    Period  Weeks    Status  On-going    Target Date  11/28/19            Plan - 11/08/19 1940    Clinical Impression Statement  Patient tolerated treatment well  overall but was limited in the exercise she could perform by the pain she has. Very TTP at posterior right shoulder and significant tightness/tenderness noted in R scalene muscles and surrounding musculature. Performed PROM stretches to help decrease tension on R first rib that may be contributing to her discomfort. Patient would benefit from continued management of limiting condition by skilled physical therapist to address remaining impairments and functional limitations to work towards stated goals and return to PLOF or maximal functional independence.    Personal Factors and Comorbidities  Age;Comorbidity 3+;Behavior Pattern;Past/Current Experience;Fitness;Time since onset of injury/illness/exacerbation;Profession    Comorbidities  anxiety, major depression (currently managed with medication to patient's satisfaction by psychiatrist), fibromyalgia, ADHD, psoriasis, plantar fasciitis, chronic back pain, chronic iritis (currently being managed).    Examination-Activity Limitations  Carry;Reach Overhead;Other;Lift;Hygiene/Grooming;Dressing;Caring for Others;Bathing   reaching   Examination-Participation Restrictions  Cleaning;Community Activity;Driving;Interpersonal Relationship;Other   vaccuming   Stability/Clinical Decision Making  Evolving/Moderate complexity    Rehab Potential  Fair    PT Frequency  2x / week    PT Duration  6 weeks    PT Treatment/Interventions  ADLs/Self Care Home Management;Aquatic Therapy;Cryotherapy;Moist Heat;Therapeutic activities;Therapeutic exercise;Neuromuscular re-education;Cognitive remediation;Patient/family education;Manual techniques;Passive range of motion;Dry needling;Spinal Manipulations;Joint Manipulations    PT Next Visit Plan  continue with strengthening and stretching as tolerated    PT Home Exercise Plan  [place] Access Code: [email or web address]    Consulted and Agree with Plan of Care  Patient       Patient will benefit from skilled therapeutic intervention in  order to improve the following deficits and impairments:  Decreased endurance, Decreased mobility, Hypomobility, Increased muscle spasms, Improper body mechanics, Impaired perceived functional ability, Decreased range of motion, Decreased activity tolerance, Decreased strength, Impaired flexibility, Impaired UE functional use, Pain  Visit Diagnosis: Cervicalgia  Chronic right shoulder pain  Muscle weakness (generalized)     Problem List Patient Active Problem List   Diagnosis Date Noted  . Plantar fasciitis 07/09/2014    Everlean Alstrom. Graylon Good, PT, DPT 11/08/19, 7:41 PM  [facility] [address], [place], [address] Phone: [phone]   Fax:  [phone]  Name: RAMONICA GRIGG MRN: [id number] Date of Birth: [birth year]

## 2019-11-10 ENCOUNTER — Ambulatory Visit: Payer: Medicare HMO | Admitting: Physical Therapy

## 2019-11-10 ENCOUNTER — Other Ambulatory Visit: Payer: Self-pay

## 2019-11-10 DIAGNOSIS — M6281 Muscle weakness (generalized): Secondary | ICD-10-CM

## 2019-11-10 DIAGNOSIS — M542 Cervicalgia: Secondary | ICD-10-CM | POA: Diagnosis not present

## 2019-11-10 DIAGNOSIS — G8929 Other chronic pain: Secondary | ICD-10-CM

## 2019-11-10 NOTE — Therapy (Signed)
[facility] [address], [place], [address] Phone: [phone]   Fax:  [phone]  Physical Therapy Treatment  Patient Details  Name: Cheryl Schroeder MRN: [id number] Date of Birth: 16-Feb-1956 Referring Provider (PT): Lynnell Jude, MD   Encounter Date: 11/10/2019  PT End of Session - 11/10/19 1316    Visit Number  14    Number of Visits  24    Date for PT Re-Evaluation  11/28/19    Authorization Type  [employer] Medicare reporting period from 10/27/2019    Authorization - Visit Number  4    Authorization - Number of Visits  10    PT Start Time  1300    PT Stop Time  1340    PT Time Calculation (min)  40 min    Activity Tolerance  Patient tolerated treatment well;Patient limited by pain    Behavior During Therapy  [facility] for tasks assessed/performed       Past Medical History:  Diagnosis Date  . Bipolar 2 disorder ([facility])   . Fibromyalgia   . Psoriasis     Past Surgical History:  Procedure Laterality Date  . ABDOMINAL HYSTERECTOMY    . ADENOIDECTOMY    . BREAST BIOPSY Right 1990   cyst  . TONSILLECTOMY      There were no vitals filed for this visit.  Subjective Assessment - 11/10/19 1301    Subjective  Patient reports she felt okay following last treatment session. She rates her pain 6/10 at the superior right shoulder. States she did some home exercises yesterday and it has been hurting a bit more following that.    Pertinent History  Patient is a 64 y.o. female who presents to outpatient physical therapy with a referral for medical diagnosis cervicalgia. This patient's chief complaints consist of right-sided neck, shoulder, and arm pain , leading to the following functional deficits: difficulty reaching, lifting, cleaning house/vaccuming, decreased quality of life. Relevant past medical history and comorbidities include anxiety, major depression (currently managed with medication to patient's satisfaction  by psychiatrist), fibromyalgia, ADHD, psoriasis, plantar fasciitis, chronic back pain, chronic iritis (currently being managed).  States that her psychiatrists believes she has major depression instead of bipolar 2    Limitations  Other (comment);House hold activities;Standing;Walking   reaching Back to the Right a certain way (back seat in the car).   Diagnostic tests  No recent imaging of the spine    Patient Stated Goals  Wants to strengthen the muscles around her R upper trap.    Currently in Pain?  Yes    Pain Score  6     Pain Location  Shoulder    Pain Orientation  Right    Pain Descriptors / Indicators  Aching    Pain Type  Chronic pain    Pain Onset  More than a month ago       TREATMENT: Denies sensitivity to latex  Therapeutic exercise:to centralize symptoms and improve ROM, strength, muscular endurance, and activity tolerance required for successful completion of functional activities. - seated AAROM pulleys into R shoulder flexion x 2 minutes  - standing close chain shoulder horizontal abduction leaning on table with hands holding yellow theraband and alternating moving each hand away and back to midline. 3x10 each side.   -Standing dynamic isometric right shoulder internal rotation with towel bolster under arm [id number].  -Standing isometric dynamic right shoulder external rotation with towel bolster under arm with yellow theraband2x10  withnodiscomfort.  - standing B shoulder extension with yellow theraband anchored at head height. 2x10. (pain last two reps of 2nd set).  - farmer's carry with 3# dumbbells in each hand held slightly away from body to activate supraspinatus. 2x100 feet - scapular protraction/retraction in modified high plank position leaning against high plinth x 10 - right first rib self mobilization seated with belt, 2x 20 (reports feeling stretch, slightly looser following).   Manual therapy:to reduce pain and tissue tension,  improve range of motion, neuromodulation, in order to promote improved ability to complete functional activities. - seated first rib MET with manual pressure anterior and inferior on R first rib just anterior to R upper trap through 3 breathing cycles, but unable to continue due to pain.   - cervical spine manual distraction to tolerance intermixed with STM to cervical spine, focusing on R scalenes.  - supine STM to posterior cervical spine muscle, R > L focusing on scalenes and upper trap/levator scap.   Pt required multimodal cuing for proper technique and to facilitate improved neuromuscular control, strength, range of motion, and functional ability resulting in improved performance and form.  HOME EXERCISE PROGRAM Access Code: [email or web address] URL: [email or web address] Date: 11/03/2019  Prepared by: Rosita Kea   Exercises Circular Shoulder Pendulum with Table Support - 3-55 minutes - 3x daily Supine Shoulder Horizontal Abduction with Resistance - 2 sets - 20 reps - 1 second hold - 1x daily Supine PNF D2 Flexion with Resistance - 2 sets - 20 reps - 1 second hold - 1x daily Supine Head Nod Deep Neck Flexor Training - 20 reps - 5 seconds hold - 1x daily First Rib Mobilization with Strap - 10 reps - 10 seconds hold -    PT Education - 11/10/19 1316    Education Details  Exercise purpose/form. Self management techniques.    Person(s) Educated  Patient    Methods  Explanation;Demonstration;Tactile cues;Verbal cues    Comprehension  Verbalized understanding;Returned demonstration;Verbal cues required;Tactile cues required;Need further instruction       PT Short Term Goals - 09/07/19 1439      PT SHORT TERM GOAL #1   Title  Be independent with initial home exercise program for self-management of symptoms.    Baseline  Initial HEP provided at IE (08/22/2019);    Time  3    Period  Weeks    Status  Achieved    Target Date  09/13/19        PT Long Term Goals -  10/17/19 1505      PT LONG TERM GOAL #1   Title  Be independent with a long-term home exercise program for self-management of symptoms    Baseline  Initial HEP provided at IE (08/22/2019); has not yet received final long term HEP (10/17/2019);    Time  6    Period  Weeks    Status  Partially Met    Target Date  11/28/19      PT LONG TERM GOAL #2   Title  Improve patient specific functional scale to equal or greater than 8/10 to demonstrate improved self-reported function.    Baseline  5.7/10 (08/22/2019); 5/10 (10/17/2019);    Time  6    Period  Weeks    Status  On-going    Target Date  11/28/19      PT LONG TERM GOAL #3   Title  Reduce pain with functional activities to equal or less than 3/10 to allow patient to complete usual activities  including ADLs, IADLs, and social engagement with less difficulty.    Baseline  8/10 (08/22/2019); 8/10 (10/17/2019);    Time  6    Period  Weeks    Status  On-going    Target Date  11/28/19      PT LONG TERM GOAL #4   Title  Patient will demonstrate equal or greater than 60 degrees cervical spine rotation each way to improve ability to check blind spot while driving.    Baseline  Rotation: R= 48, L = 38. Both painful at R neck.  (08/22/2019); Rotation: R= 52, L = 30. Both painful at R neck.(10/17/2019);    Time  6    Period  Weeks    Status  On-going    Target Date  11/28/19      PT LONG TERM GOAL #5   Title  Complete community, work and/or recreational activities without limitation due to current condition    Baseline  limited in lifting, reaching, housecleaning/vaccuming, quality of life (08/22/2019); conitnues to report similar limitations (10/17/2019);    Time  6    Period  Weeks    Status  On-going    Target Date  11/28/19            Plan - 11/10/19 1355    Clinical Impression Statement  Patient tolerated treatment fairly well overall but was limited by pain in the shoulder that occurred with last few reps of shoulder extension  exercise. Kept exercises below shoulder level and used isometric contractions to be more tolerable. Able to perform more exercise today before needing to discontinue due to pain. Patient would benefit from continued management of limiting condition by skilled physical therapist to address remaining impairments and functional limitations to work towards stated goals and return to PLOF or maximal functional independence.    Personal Factors and Comorbidities  Age;Comorbidity 3+;Behavior Pattern;Past/Current Experience;Fitness;Time since onset of injury/illness/exacerbation;Profession    Comorbidities  anxiety, major depression (currently managed with medication to patient's satisfaction by psychiatrist), fibromyalgia, ADHD, psoriasis, plantar fasciitis, chronic back pain, chronic iritis (currently being managed).    Examination-Activity Limitations  Carry;Reach Overhead;Other;Lift;Hygiene/Grooming;Dressing;Caring for Others;Bathing   reaching   Examination-Participation Restrictions  Cleaning;Community Activity;Driving;Interpersonal Relationship;Other   vaccuming   Stability/Clinical Decision Making  Evolving/Moderate complexity    Rehab Potential  Fair    PT Frequency  2x / week    PT Duration  6 weeks    PT Treatment/Interventions  ADLs/Self Care Home Management;Aquatic Therapy;Cryotherapy;Moist Heat;Therapeutic activities;Therapeutic exercise;Neuromuscular re-education;Cognitive remediation;Patient/family education;Manual techniques;Passive range of motion;Dry needling;Spinal Manipulations;Joint Manipulations    PT Next Visit Plan  continue with strengthening and stretching as tolerated    PT Home Exercise Plan  [facility] Access Code: [email or web address]    Consulted and Agree with Plan of Care  Patient       Patient will benefit from skilled therapeutic intervention in order to improve the following deficits and impairments:  Decreased endurance, Decreased mobility, Hypomobility, Increased muscle spasms,  Improper body mechanics, Impaired perceived functional ability, Decreased range of motion, Decreased activity tolerance, Decreased strength, Impaired flexibility, Impaired UE functional use, Pain  Visit Diagnosis: Cervicalgia  Chronic right shoulder pain  Muscle weakness (generalized)     Problem List Patient Active Problem List   Diagnosis Date Noted  . Plantar fasciitis 07/09/2014    Everlean Alstrom. Graylon Good, PT, DPT 11/10/19, 1:55 PM  [facility] [address], [place], [address] Phone: [phone]   Fax:  [phone]  Name: Cheryl Schroeder MRN: [id number] Date of Birth: [birth year]

## 2019-11-15 ENCOUNTER — Encounter: Payer: Self-pay | Admitting: Physical Therapy

## 2019-11-15 ENCOUNTER — Ambulatory Visit: Payer: Medicare HMO | Admitting: Physical Therapy

## 2019-11-15 ENCOUNTER — Other Ambulatory Visit: Payer: Self-pay

## 2019-11-15 DIAGNOSIS — M6281 Muscle weakness (generalized): Secondary | ICD-10-CM

## 2019-11-15 DIAGNOSIS — M542 Cervicalgia: Secondary | ICD-10-CM | POA: Diagnosis not present

## 2019-11-15 DIAGNOSIS — G8929 Other chronic pain: Secondary | ICD-10-CM

## 2019-11-15 NOTE — Therapy (Addendum)
[facility] [address] [place], [place], [address] Phone: [phone]   Fax:  [phone]  Physical Therapy Treatment / Discharge Summary Reporting period: 08/22/2019 - 11/15/2019  Patient Details  Name: Cheryl Schroeder MRN: [id number] Date of Birth: 03/01/1956 Referring Provider (PT): Laura K. Bliss, MD   Encounter Date: 11/15/2019  PT End of Session - 11/16/19 1840    Visit Number  15    Number of Visits  24    Date for PT Re-Evaluation  11/28/19    Authorization Type  [employer] Medicare reporting period from 10/27/2019    Authorization - Visit Number  5    Authorization - Number of Visits  10    PT Start Time  1300    PT Stop Time  1340    PT Time Calculation (min)  40 min    Activity Tolerance  Patient tolerated treatment well;Patient limited by pain    Behavior During Therapy  WFL for tasks assessed/performed       Past Medical History:  Diagnosis Date  . Bipolar 2 disorder ([facility])   . Fibromyalgia   . Psoriasis     Past Surgical History:  Procedure Laterality Date  . ABDOMINAL HYSTERECTOMY    . ADENOIDECTOMY    . BREAST BIOPSY Right 1990   cyst  . TONSILLECTOMY      There were no vitals filed for this visit.  Subjective Assessment - 11/16/19 1833    Subjective  Patient reports she does not feel like she is making any real progress and wants to know if clinician thinks so. She reports her greatest pain level during functional activities contiues to be 8/10 and seems to be the same rate as previously. She continues to have difficulty with HEP due to pain and it seems her pain gets worse the more she does. After further discussion, patient agrees that it is time to discharge from physical therapy due to lack of progress. She reports she has about the same difficulty with funcitonal tasks as she did when starting PT.    Pertinent History  Patient is a 64 y.o. female who presents to outpatient physical therapy  with a referral for medical diagnosis cervicalgia. This patient's chief complaints consist of right-sided neck, shoulder, and arm pain , leading to the following functional deficits: difficulty reaching, lifting, cleaning house/vaccuming, decreased quality of life. Relevant past medical history and comorbidities include anxiety, major depression (currently managed with medication to patient's satisfaction by psychiatrist), fibromyalgia, ADHD, psoriasis, plantar fasciitis, chronic back pain, chronic iritis (currently being managed).  States that her psychiatrists believes she has major depression instead of bipolar 2    Limitations  Other (comment);House hold activities;Standing;Walking   reaching Back to the Right a certain way (back seat in the car).   Diagnostic tests  No recent imaging of the spine    Patient Stated Goals  Wants to strengthen the muscles around her R upper trap.    Currently in Pain?  Yes    Pain Score  8     Pain Location  Shoulder    Pain Orientation  Right    Pain Descriptors / Indicators  Aching    Pain Type  Chronic pain    Pain Radiating Towards  pain from neck to elbow on right side    Pain Onset  More than a month ago    Aggravating Factors   reaching backwards a certain way, using R hand such   as wiping    Pain Relieving Factors  ice, heat    Effect of Pain on Daily Activities  makes her not want to do anything, makes her feel yucky but not as much. difficulty reaching, lifting, cleaning house/vaccuming, decreased quality of life       [facility] PT Assessment - 11/16/19 0001      Assessment   Medical Diagnosis  cervicalgia    Referring Provider (PT)  Laura K. Bliss, MD    Onset Date/Surgical Date  06/30/19    Hand Dominance  Left   left for writing, right for everything else   Next MD Visit  none scheduled    Prior Therapy  none for this problem prior to this episode of care.       Precautions   Precautions  None      Restrictions   Weight Bearing Restrictions   No      Home Environment   Living Environment  Private residence    Living Arrangements  Parent    Type of Home  House    Home Access  Stairs to enter    Entrance Stairs-Number of Steps  2    Entrance Stairs-Rails  Right    Home Layout  One level    Home Equipment  --   mother has equipment and is quite independent.     Prior Function   Level of Independence  Independent    Vocation  On disability   Nurse   Leisure  reading, being outside, planting flowers, be active      Observation/Other Assessments   Observations  see note from 11/16/2019       OBJECTIVE  OUTCOME MEASURES Patient Specific Functional Scale (rated 0 = unable to perform, 10 = able to perform without limitations) 1. Lifting: 6-7/10 2. Vacuuming/cleaning house: 5/10 3. Prolonged standing: 5/10  (over 10-20 min continuous standing without sitting break, then back starts hurting).  Average: 5.5   SPINE MOTION Cervical Spine AROM *Indicates pain Flexion: =60, painful Extension: =55, painful Rotation: R=56, L =42.Painful Side Flexion: R=20 mild pain L =20 lots of pain R RUT Slow moving.   PERIPHERAL JOINT MOTION (in degrees) Comments:B shoulders WFL AROM. Patient moves cautiously but more confidently than at initial eval  MUSCLE PERFORMANCE (MMT):  *Indicates pain 08/22/2019 10/17/19 11/15/19  Joint/Motion R/L R/L R/L  Shoulder     Flexion 4*/5 4*/4+ 4+*/4+  Extension 5/5 / /  Abduction 4*/5 4+/4+ 4+*/4+  External rotation 4+/4+ 4+/4+ 4+/4+  Internal rotation 4+*/5 4+/4+ 4+/4+  Elbow     Flexion 5/5 5/5 5/5  Extension 5/5 5/5 5/5  Hand     Grip WFL L>R WFL L>R WFL L>R       TREATMENT: Denies sensitivity to latex  Therapeutic exercise:to centralize symptoms and improve ROM, strength, muscular endurance, and activity tolerance required for successful completion of functional activities. - examination to assess progress and assess for discharge (see above).   Manual  therapy:to reduce pain and tissue tension, improve range of motion, neuromodulation, in order to promote improved ability to complete functional activities.  - cervical spine manual distraction to tolerance intermixed with STM to cervical spine, focusing on R scalenes.  - supine STM to posterior cervical spine muscle, R > L focusing on scalenes and upper trap/levator scap.   Patient interrupted during session to take call from her mother who is home alone and patient is caregiver for.   HOME EXERCISE PROGRAM Access Code: [email or web address] URL: [email or web address]   Date: 11/03/2019  Prepared by: Sara Snyder   Exercises Circular Shoulder Pendulum with Table Support - 3-5 minutes - 3x daily Supine Shoulder Horizontal Abduction with Resistance - 2 sets - 20 reps - 1 second hold - 1x daily Supine PNF D2 Flexion with Resistance - 2 sets - 20 reps - 1 second hold - 1x daily Supine Head Nod Deep Neck Flexor Training - 20 reps - 5 seconds hold - 1x daily First Rib Mobilization with Strap - 10 reps - 10 seconds hold     PT Education - 11/16/19 1837    Education Details  Exercise purpose/form. Self management techniques. discharge instructions    Person(s) Educated  Patient    Methods  Explanation    Comprehension  Verbalized understanding       PT Short Term Goals - 09/07/19 1439      PT SHORT TERM GOAL #1   Title  Be independent with initial home exercise program for self-management of symptoms.    Baseline  Initial HEP provided at IE (08/22/2019);    Time  3    Period  Weeks    Status  Achieved    Target Date  09/13/19        PT Long Term Goals - 11/16/19 1842      PT LONG TERM GOAL #1   Title  Be independent with a long-term home exercise program for self-management of symptoms    Baseline  Initial HEP provided at IE (08/22/2019); has not yet received final long term HEP (10/17/2019); continues to be intolerant to any progressions in HEP (11/16/2019);    Time  6     Period  Weeks    Status  Not Met    Target Date  11/28/19      PT LONG TERM GOAL #2   Title  Improve patient specific functional scale to equal or greater than 8/10 to demonstrate improved self-reported function.    Baseline  5.7/10 (08/22/2019); 5/10 (10/17/2019); 5.5 (11/16/2019);    Time  6    Period  Weeks    Status  Not Met    Target Date  11/28/19      PT LONG TERM GOAL #3   Title  Reduce pain with functional activities to equal or less than 3/10 to allow patient to complete usual activities including ADLs, IADLs, and social engagement with less difficulty.    Baseline  8/10 (08/22/2019); 8/10 (10/17/2019); 8/10 (11/16/2019);    Time  6    Period  Weeks    Status  Not Met    Target Date  11/28/19      PT LONG TERM GOAL #4   Title  Patient will demonstrate equal or greater than 60 degrees cervical spine rotation each way to improve ability to check blind spot while driving.    Baseline  Rotation: R= 48, L = 38. Both painful at R neck.  (08/22/2019); Rotation: R= 52, L = 30. Both painful at R neck.(10/17/2019); Rotation: R= 56, L = 42. Painful (11/16/2019);    Time  6    Period  Weeks    Status  Partially Met    Target Date  11/28/19      PT LONG TERM GOAL #5   Title  Complete community, work and/or recreational activities without limitation due to current condition    Baseline  limited in lifting, reaching, housecleaning/vaccuming, quality of life (08/22/2019); conitnues to report similar limitations (10/17/2019, 11/15/2019);    Time  6      Period  Weeks    Status  Not Met    Target Date  11/28/19            Plan - 11/16/19 1849    Clinical Impression Statement  Patient tolerated treatment fair and reported some mild decrease in tension with manual therapy. Patient has attended 15 physical therapy sessions this episode of care and overall has partially met her cervical spine range of motion goal but did not progress towards remaining long term goals despite using various  approaches in treatment. Patient continues to report similar deficits and pain levels and continues to struggle with activity tolerance. Patient is now discharged from physical therapy due to lack of adequate progress, and referred back to referring clinician for further medical evaluation.    Personal Factors and Comorbidities  Age;Comorbidity 3+;Behavior Pattern;Past/Current Experience;Fitness;Time since onset of injury/illness/exacerbation;Profession    Comorbidities  anxiety, major depression (currently managed with medication to patient's satisfaction by psychiatrist), fibromyalgia, ADHD, psoriasis, plantar fasciitis, chronic back pain, chronic iritis (currently being managed).    Examination-Activity Limitations  Carry;Reach Overhead;Other;Lift;Hygiene/Grooming;Dressing;Caring for Others;Bathing   reaching   Examination-Participation Restrictions  Cleaning;Community Activity;Driving;Interpersonal Relationship;Other   vaccuming   Stability/Clinical Decision Making  Evolving/Moderate complexity    Rehab Potential  Fair    PT Frequency  2x / week    PT Duration  6 weeks    PT Treatment/Interventions  ADLs/Self Care Home Management;Aquatic Therapy;Cryotherapy;Moist Heat;Therapeutic activities;Therapeutic exercise;Neuromuscular re-education;Cognitive remediation;Patient/family education;Manual techniques;Passive range of motion;Dry needling;Spinal Manipulations;Joint Manipulations    PT Next Visit Plan  Patient is now discharged from physical therapy due to lack of adequate progress    PT [place] Access Code: [email or web address]    Consulted and Agree with Plan of Care  Patient       Patient will benefit from skilled therapeutic intervention in order to improve the following deficits and impairments:  Decreased endurance, Decreased mobility, Hypomobility, Increased muscle spasms, Improper body mechanics, Impaired perceived functional ability, Decreased range of motion, Decreased  activity tolerance, Decreased strength, Impaired flexibility, Impaired UE functional use, Pain  Visit Diagnosis: Cervicalgia  Chronic right shoulder pain  Muscle weakness (generalized)     Problem List Patient Active Problem List   Diagnosis Date Noted  . Plantar fasciitis 07/09/2014    Everlean Alstrom. Graylon Good, PT, DPT 11/16/19, 6:50 PM  [facility] [address], [place], [address] Phone: [phone]   Fax:  [phone]  Name: Cheryl Schroeder MRN: [id number] Date of Birth: 01/24/1956

## 2019-11-17 ENCOUNTER — Ambulatory Visit: Payer: Medicare HMO | Admitting: Physical Therapy

## 2019-11-22 ENCOUNTER — Encounter: Payer: Medicare HMO | Admitting: Physical Therapy

## 2019-11-24 ENCOUNTER — Encounter: Payer: Medicare HMO | Admitting: Physical Therapy

## 2019-11-29 ENCOUNTER — Encounter: Payer: Medicare HMO | Admitting: Physical Therapy

## 2019-12-01 ENCOUNTER — Encounter: Payer: Medicare HMO | Admitting: Physical Therapy

## 2020-03-16 ENCOUNTER — Other Ambulatory Visit: Payer: Self-pay

## 2020-03-16 ENCOUNTER — Ambulatory Visit: Payer: Medicare HMO | Admitting: Podiatry

## 2020-03-16 ENCOUNTER — Encounter: Payer: Self-pay | Admitting: Podiatry

## 2020-03-16 ENCOUNTER — Ambulatory Visit (INDEPENDENT_AMBULATORY_CARE_PROVIDER_SITE_OTHER): Payer: Medicare HMO

## 2020-03-16 DIAGNOSIS — M722 Plantar fascial fibromatosis: Secondary | ICD-10-CM | POA: Diagnosis not present

## 2020-03-16 DIAGNOSIS — M7741 Metatarsalgia, right foot: Secondary | ICD-10-CM

## 2020-03-16 DIAGNOSIS — M7742 Metatarsalgia, left foot: Secondary | ICD-10-CM | POA: Diagnosis not present

## 2020-03-16 NOTE — Progress Notes (Signed)
   HPI: 64 y.o. female presenting today as a new patient for evaluation of bilateral foot pain.  Patient states she was diagnosed with plantar fasciitis in the past.  She states that she gets significant pain in her heels and her forefoot.  She denies injury.  This is been ongoing for several years now.  She presents for further treatment evaluation  Past Medical History:  Diagnosis Date  . Bipolar 2 disorder ([place])   . Fibromyalgia   . Psoriasis      Physical Exam: General: The patient is alert and oriented x3 in no acute distress.  Dermatology: Skin is warm, dry and supple bilateral lower extremities. Negative for open lesions or macerations.  Vascular: Palpable pedal pulses bilaterally. No edema or erythema noted. Capillary refill within normal limits.  Neurological: Epicritic and protective threshold grossly intact bilaterally.   Musculoskeletal Exam: Range of motion within normal limits to all pedal and ankle joints bilateral. Muscle strength 5/5 in all groups bilateral.  Tenderness to palpation noted bilateral heels consistent with plantar fasciitis.  There is also tenderness with palpation to the plantar aspect of the metatarsal heads consistent with metatarsalgia bilateral.  Radiographic Exam:  Normal osseous mineralization. Joint spaces preserved. No fracture/dislocation/boney destruction.    Assessment: 1.  Plantar fasciitis bilateral 2.  Metatarsalgia bilateral   Plan of Care:  1. Patient evaluated. X-Rays reviewed.  2.  Recommend conservative treatment at this time.  OTC power step insoles were provided today. 3.  Recommend good supportive shoe gear and not going barefoot 4.  Continue Vionic sandals when wearing sandals that offer good arch support 5.  Return to clinic as needed      Edrick Kins, DPM [facility]  Dr. Edrick Kins, DPM    [address], [place] [address]                Office [phone]  Fax [phone]

## 2020-03-19 ENCOUNTER — Other Ambulatory Visit: Payer: Self-pay | Admitting: Podiatry

## 2020-03-19 DIAGNOSIS — M722 Plantar fascial fibromatosis: Secondary | ICD-10-CM

## 2020-07-05 ENCOUNTER — Other Ambulatory Visit: Payer: Self-pay | Admitting: Family Medicine

## 2020-07-05 DIAGNOSIS — Z1231 Encounter for screening mammogram for malignant neoplasm of breast: Secondary | ICD-10-CM

## 2020-07-31 ENCOUNTER — Inpatient Hospital Stay: Admission: RE | Admit: 2020-07-31 | Payer: Medicare HMO | Source: Ambulatory Visit

## 2020-08-20 ENCOUNTER — Encounter: Payer: Medicare HMO | Admitting: Physical Therapy

## 2020-08-21 ENCOUNTER — Encounter: Payer: Self-pay | Admitting: Physical Therapy

## 2020-08-21 ENCOUNTER — Ambulatory Visit: Payer: Medicare HMO | Attending: Orthopedic | Admitting: Physical Therapy

## 2020-08-21 ENCOUNTER — Other Ambulatory Visit: Payer: Self-pay

## 2020-08-21 DIAGNOSIS — M5412 Radiculopathy, cervical region: Secondary | ICD-10-CM | POA: Diagnosis present

## 2020-08-21 DIAGNOSIS — M542 Cervicalgia: Secondary | ICD-10-CM | POA: Diagnosis present

## 2020-08-21 DIAGNOSIS — M62838 Other muscle spasm: Secondary | ICD-10-CM | POA: Insufficient documentation

## 2020-08-21 NOTE — Therapy (Signed)
[facility] [address] [place], [address] Phone: [phone]   Fax:  [phone]  Physical Therapy Evaluation  Patient Details  Name: Cheryl Schroeder MRN: [id number] Date of Birth: Oct 18, 1955 Referring Provider (PT): Havery Moros, NP    Encounter Date: 08/21/2020   PT End of Session - 08/21/20 1940    Visit Number 1    Number of Visits 24    Date for PT Re-Evaluation 11/13/20    Authorization Type [employer]IC[employer] reporting period from 08/21/2020    Authorization - Visit Number 1    Authorization - Number of Visits 1    PT Start Time 2836    PT Stop Time 1917    PT Time Calculation (min) 60 min    Activity Tolerance Patient tolerated treatment well;Patient limited by pain    Behavior During Therapy [facility] for tasks assessed/performed;Anxious           Past Medical History:  Diagnosis Date  . Bipolar 2 disorder ([place])   . Fibromyalgia   . Psoriasis     Past Surgical History:  Procedure Laterality Date  . ABDOMINAL HYSTERECTOMY    . ADENOIDECTOMY    . BREAST BIOPSY Right 1990   cyst  . TONSILLECTOMY      There were no vitals filed for this visit.   Subjective Assessment - 08/21/20 1832    Subjective Patient is known to this PT and clinic. Patient states she has chronic but manageable pain over her right neck and shoulder after multiple falls on the right side of her body but recently had an acute exacerbation around the first of October when she lost her balance when getting out of bed. She cares for her mother at home. She fell between the wall and the bookcase. She hit her right side like she normally hits and she hit the left side of her head. Ever since then the whole right shoulder and neck is hurting. She can raise her arm but it hurts her to vacuuming and raise her right arm repeatedly. She also has has difficulty walking a long time. It hurts mostly the next day after she does  walking for up to an hour. Her lower back hurts her too. She has been trying to walk, but she has trouble getting out.    Pertinent History Patient is a 64 y.o. female who presents to outpatient physical therapy with a referral for medical diagnosis cervicalgia, radiculitis of right cervical region. This patient's chief complaints consist of right neck, shoulder, and head pain leading to the following functional deficits: difficulty with anything that requires repeated use of the right UE such as vacuuming, mopping, cleaning, repeated reaching, groceries, repeated use of right side, prolonged sitting, reclined sitting, walking, and lying down. Relevant past medical history and comorbidities include anxiety, major depression (currently managed with medication to patient's satisfaction by psychiatrist), fibromyalgia, ADHD, psoriasis, plantar fasciitis, chronic back pain, chronic iritis (currently being managed).  States that her psychiatrists believes she has major depression instead of bipolar 2. Patient denies hx of cancer, stroke, seizures, lung problem, major cardiac events, diabetes, unexplained weight loss, changes in bowel or bladder problems, new onset stumbling or dropping things.    Limitations Other (comment);House hold activities;Standing;Walking;Lifting;Sitting   anything that requires repeated use of the right UE such as vacuuming, mopping, cleaning, repeated reaching, groceries, repeated use of right side, prolonged sitting, reclined sitting, walking, and lying down.   Diagnostic tests  C-spine radiograph report 08/07/2020: "IMPRESSION:1. Mild C5-6 and C6-7 degenerative disc disease. Diffuse cervical spine facet osteoarthrosis.2. Slight C4 on C5 anterolisthesis present in flexion reduces in neutral and extension. Slight C5 on C6 anterolisthesis remains unchanged between flexion and extension.  "    Patient Stated Goals "I would like to be able to strengthen some of the arm muscles and make the pain  better"    Currently in Pain? Yes    Pain Score 5    W: 8/10; B: 3/10   Pain Location Neck    Pain Orientation Right   R upper trap is the centrer and radiates to lateral arm above elbow. up neck to headaches.   Pain Descriptors / Indicators Aching;Tightness    Pain Radiating Towards down R arm to upper arm (above elbow) and up to head to cause headache.    Pain Onset More than a month ago    Pain Frequency Constant    Aggravating Factors  using the right arm, prolonged sitting, reclined sitting, walking too long, laying down on right side down    Pain Relieving Factors heat, pain medication, cold compress, resting when really worn out, massage (from mother)    Effect of Pain on Daily Activities Functional Limitations: anything that requires repeated use of the right UE such as vacuuming, mopping, cleaning, repeated reaching, groceries, repeated use of right side, prolonged sitting, reclined sitting, walking, and lying down.              [facility] PT Assessment - 08/21/20 0001      Assessment   Medical Diagnosis cervicalgia, radiculitis of right cervical region    Referring Provider (PT) Havery Moros, NP     Onset Date/Surgical Date 07/06/20    Hand Dominance Left   right for writing, left for everything else   Next MD Visit when needed    Prior Therapy prior therapy last year with mixed success      Precautions   Precautions None      Restrictions   Weight Bearing Restrictions No      Balance Screen   Has the patient fallen in the past 6 months Yes    How many times? 1    Has the patient had a decrease in activity level because of a fear of falling?  No    Is the patient reluctant to leave their home because of a fear of falling?  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 Posture: very upright posture in sitting . Tight and over active appearing musculature noted at upper traps R > L .  Tremor: none . Muscle bulk: grossly WFL . Transfers: WFL. . Gait: grossly WFL for household and short community ambulation. More detailed gait analysis deferred to later date as needed.   NEUROLOGICAL Dermatomes . C3-T1 appears equal and intact to light touch.  Myotomes . C3-T1 appears intact  PERIPHERAL JOINT MOTION (in degrees) Active Range of Motion (AROM) B UE AROM WNL except painful on right with shoulder movements.   MUSCLE PERFORMANCE (MMT):  *Indicates pain 08/21/20 Date Date  Joint/Motion R/L R/L R/L  Shoulder     Flexion 4*/5 / /  Abduction 4*/5 / /  External rotation 5/5 / /  Internal rotation 4*/5 / /  Extension / / /  Elbow     Flexion 5*/5 / /  Extension 5/5 / /  Wrist     Flexion 4/4 / /  Extension 4/4 / /  Comments: R IR biceps pain. Very little pain over R biceps with elbow flexion. B grip WFL.   SPINE MOTION Cervical Spine AROM *Indicates pain Flexion: = 52 (end range pain/tightness over right upper trap).  Extension: = [employer] end range pain Rotation: R= 50 pain at right ide of neck., L = 39 "arthritis ache at left". Side Flexion: R= 25, L = 22 pain over right upper trap. Protraction:  WFL Retraction: 25% limited  REPEATED MOTIONS TESTING:  Seated cervical retraction 2x10, slight increase, no worse  Seated cervical retraction  with self overpressure 2x10, slight increase, no worse  Seated cervical retraction with self overpressure to extension 2x10, slight increase, no worse  Seated thoracic extension over back of chair, 1x10, increase no worse.  Seated thoracic extension over back of chair while sitting on airex pad, 1x10, increase, no worse (quite uncomfortable).   EDUCATION/COGNITION: Patient is alert and oriented X 4.   TREATMENT:   Therapeutic exercise: to centralize symptoms and improve ROM, strength, muscular endurance, and activity tolerance required for successful completion of functional activities.   Seated cervical retraction 2x10, slight increase, no worse  Seated cervical retraction with self overpressure 2x10, slight increase, no worse  Seated cervical retraction with self overpressure to extension 2x10, slight increase, no worse  Exercise purpose/form. Self management techniques. Education on diagnosis, prognosis, POC, anatomy and physiology of current condition Education on HEP including handout    [place]      PT Education - 08/21/20 1939    Education Details Exercise purpose/form. Self management techniques. Education on diagnosis, prognosis, POC, anatomy and physiology of current condition Education on HEP including handout    Person(s) Educated Patient    Methods Explanation;Demonstration;Tactile cues;Verbal cues;Handout    Comprehension Verbalized understanding;Returned demonstration;Verbal cues required;Tactile cues required;Need further instruction            PT Short Term Goals - 08/21/20 1947      PT SHORT TERM GOAL #1   Title Be independent with initial home exercise program for self-management of symptoms.    Baseline Initial HEP provided at IE (08/21/2020);    Time 3    Period Weeks    Status New    Target Date 09/11/20             PT Long Term Goals - 08/21/20 1947      PT LONG TERM GOAL #1   Title Be independent with a long-term home  exercise program for self-management of symptoms    Baseline Initial HEP provided at IE (08/21/2020);    Time 12    Period Weeks  Status New   TARGET DATE FOR ALL LONG TERM GOALS: 11/13/2020     PT LONG TERM GOAL #2   Title Demonstrate improved FOTO score by 10 units to demonstrate improvement in overall condition and self-reported functional ability.    Baseline 65 (08/21/2020);    Time 12    Period Weeks    Status New      PT LONG TERM GOAL #3   Title Reduce pain with functional activities to equal or less than 3/10 to allow patient to complete usual activities including ADLs, IADLs, and social engagement with less difficulty.    Baseline 8/10 (08/21/2020);    Time 12    Period Weeks    Status New      PT LONG TERM GOAL #4   Title Patient will demonstrate equal or greater than 60 degrees cervical spine rotation each way to improve ability to check blind spot while driving.    Baseline Rotation: R= 50 pain at right ide of neck., L = 39 "arthritis ache at left" (08/21/2020);    Time 12    Period Weeks    Status New      PT LONG TERM GOAL #5   Title Complete community, work and/or recreational activities without limitation due to current condition    Baseline Functional Limitations: anything that requires repeated use of the right UE such as vacuuming, mopping, cleaning, repeated reaching, groceries, repeated use of right side, prolonged sitting, reclined sitting, walking, and lying down (08/21/2020);    Time 12    Period Weeks    Status New                 Plan - 08/21/20 1944    Clinical Impression Statement Patient is a 64 y.o. female referred to outpatient physical therapy with a medical diagnosis of cervicalgia, radiculitis of right cervical region who presents with signs and symptoms consistent with acute on chronic exacerbation of right sided cervical spine and upper thoracic pain that radiates into the right arm and contributes to headaches. Patient presents with  significant pain, ROM, joint stiffness, muscle spasm, muscle performance (strength/endurance/power), postural control impairments that are limiting ability to complete her usual activities including anything that requires repeated use of the right UE such as vacuuming, mopping, cleaning, repeated reaching, groceries, repeated use of right side, prolonged sitting, reclined sitting, walking, and lying down without difficulty. Patient will benefit from skilled physical therapy intervention to address current body structure impairments and activity limitations to improve function and work towards goals set in current POC in order to return to prior level of function or maximal functional improvement.    Personal Factors and Comorbidities Age;Comorbidity 3+;Past/Current Experience;Fitness;Time since onset of injury/illness/exacerbation    Comorbidities anxiety, major depression (currently managed with medication to patient's satisfaction by psychiatrist), fibromyalgia, ADHD, psoriasis, plantar fasciitis, chronic back pain, chronic iritis (currently being managed).    Examination-Activity Limitations Carry;Reach Overhead;Other;Lift;Hygiene/Grooming;Dressing;Caring for Others;Bathing;Sleep    Examination-Participation Restrictions Cleaning;Community Activity;Interpersonal Relationship;Other;[place] [employer] Work;Laundry   anything that requires repeated use of the right UE such as vacuuming, mopping, cleaning, repeated reaching, groceries, repeated use of right side, prolonged sitting, reclined sitting, walking, and lying down.   Stability/Clinical Decision Making Stable/Uncomplicated    Clinical Decision Making Low    Rehab Potential Good    PT Frequency 2x / week    PT Duration 12 weeks    PT Treatment/Interventions ADLs/Self Care Home Management;Aquatic Therapy;Cryotherapy;Moist Heat;Therapeutic activities;Therapeutic exercise;Neuromuscular re-education;Cognitive remediation;Patient/family education;Manual  techniques;Passive range of motion;Dry needling;Spinal  Manipulations;Joint Manipulations;Biofeedback;Electrical Stimulation    PT Next Visit Plan manual therapy, assess response to HEP    PT Home Exercise Plan cervical retraction and extension    Consulted and Agree with Plan of Care Patient           Patient will benefit from skilled therapeutic intervention in order to improve the following deficits and impairments:  Decreased endurance, Hypomobility, Increased muscle spasms, Improper body mechanics, Impaired perceived functional ability, Decreased range of motion, Decreased activity tolerance, Decreased strength, Impaired flexibility, Impaired UE functional use, Pain, Postural dysfunction  Visit Diagnosis: Cervicalgia  Other muscle spasm  Radiculopathy, cervical region     Problem List Patient Active Problem List   Diagnosis Date Noted  . Plantar fasciitis 07/09/2014    Everlean Alstrom. Graylon Good, PT, DPT 08/21/20, 7:54 PM  [facility] [address] [place], [address] Phone: [phone]   Fax:  [phone]  Name: Cheryl Schroeder MRN: [id number] Date of Birth: 10/09/1955

## 2020-08-28 ENCOUNTER — Ambulatory Visit: Payer: Medicare HMO | Admitting: Physical Therapy

## 2020-08-28 ENCOUNTER — Encounter: Payer: Self-pay | Admitting: Physical Therapy

## 2020-08-28 ENCOUNTER — Other Ambulatory Visit: Payer: Self-pay

## 2020-08-28 DIAGNOSIS — M542 Cervicalgia: Secondary | ICD-10-CM | POA: Diagnosis not present

## 2020-08-28 DIAGNOSIS — M62838 Other muscle spasm: Secondary | ICD-10-CM

## 2020-08-28 DIAGNOSIS — M5412 Radiculopathy, cervical region: Secondary | ICD-10-CM

## 2020-08-28 NOTE — Therapy (Signed)
[facility] [address] [place], [address] Phone: [phone]   Fax:  [phone]  Physical Therapy Treatment  Patient Details  Name: Cheryl Schroeder MRN: [id number] Date of Birth: 09/13/1956 Referring Provider (PT): Havery Moros, NP    Encounter Date: 08/28/2020   PT End of Session - 08/28/20 0939    Visit Number 2    Number of Visits 24    Date for PT Re-Evaluation 11/13/20    Authorization Type [employer] reporting period from 08/21/2020    Authorization Time Period [employer] auth 24 PT 08/28/20 - 11/13/20 Authorization #[id number]    Authorization - Visit Number 1    Authorization - Number of Visits 24    Progress Note Due on Visit 10    PT Start Time 0945    PT Stop Time 1025    PT Time Calculation (min) 40 min    Activity Tolerance Patient tolerated treatment well;Patient limited by pain    Behavior During Therapy [facility] for tasks assessed/performed;Anxious           Past Medical History:  Diagnosis Date  . Bipolar 2 disorder ([place])   . Fibromyalgia   . Psoriasis     Past Surgical History:  Procedure Laterality Date  . ABDOMINAL HYSTERECTOMY    . ADENOIDECTOMY    . BREAST BIOPSY Right 1990   cyst  . TONSILLECTOMY      There were no vitals filed for this visit.   Subjective Assessment - 08/28/20 0945    Subjective Pateint reports she felt okay when she went home after last treatment session. She did her exercises 6 times one day, then 3 times the next day and it felt okay and then she got her booster shot and pain got much worse in the right side and she could not do more exercises. She thought it was the booster shot but then determined it was the exericse because she has never had a reaction to a vaccine. She started having some headache like she usually does with the neck and shoulder pain. She has not attempted any more exercises since then. She just cleaned two bathrooms last  night which further exacerbated the pain and she is "miserable" today. Cleaning increased her neck and back pain. She could not go to sleep last night and had to take pain medication last week. She has been icing neck and shoulders and this has helped. Rates pain 6/10 currently over right upper trap and at base of neck and to R arm above elbow. Is not into her head yet.    Pertinent History Patient is a 64 y.o. female who presents to outpatient physical therapy with a referral for medical diagnosis cervicalgia, radiculitis of right cervical region. This patient's chief complaints consist of right neck, shoulder, and head pain leading to the following functional deficits: difficulty with anything that requires repeated use of the right UE such as vacuuming, mopping, cleaning, repeated reaching, groceries, repeated use of right side, prolonged sitting, reclined sitting, walking, and lying down. Relevant past medical history and comorbidities include anxiety, major depression (currently managed with medication to patient's satisfaction by psychiatrist), fibromyalgia, ADHD, psoriasis, plantar fasciitis, chronic back pain, chronic iritis (currently being managed).  States that her psychiatrists believes she has major depression instead of bipolar 2. Patient denies hx of cancer, stroke, seizures, lung problem, major cardiac events, diabetes, unexplained weight loss, changes in bowel or bladder problems, new onset stumbling or  dropping things.    Limitations Other (comment);House hold activities;Standing;Walking;Lifting;Sitting   anything that requires repeated use of the right UE such as vacuuming, mopping, cleaning, repeated reaching, groceries, repeated use of right side, prolonged sitting, reclined sitting, walking, and lying down.   Diagnostic tests C-spine radiograph report 08/07/2020: "IMPRESSION:1. Mild C5-6 and C6-7 degenerative disc disease. Diffuse cervical spine facet osteoarthrosis.2. Slight C4 on C5  anterolisthesis present in flexion reduces in neutral and extension. Slight C5 on C6 anterolisthesis remains unchanged between flexion and extension.  "    Patient Stated Goals "I would like to be able to strengthen some of the arm muscles and make the pain better"    Currently in Pain? Yes    Pain Score 6     Pain Location Neck           TREATMENT:   Therapeutic exercise:to centralize symptoms and improve ROM, strength, muscular endurance, and activity tolerance required for successful completion of functional activities.   Check of HEP, retraction with OP x 5 and with extension x 2 (discontined extension due to pain, noted for back to protraction before extension, otherwise good technique).   Discussed returning to supine chin tuck and only doing retraction (not with extension) for HEP.   Manual therapy: to reduce pain and tissue tension, improve range of motion, neuromodulation, in order to promote improved ability to complete functional activities. - supine STM to posterior cervical musculature focusing on R upper trap, paraspinal, and suboccipital region with trigger point release as tolerated. Very tender to suboccipital region, mid cervical spine, and some in right upper trap as well. Improved with continued manual.    [place]     PT Education - 08/28/20 0939    Education Details Exercise purpose/form. Self management techniques.    Person(s) Educated Patient    Methods Explanation;Demonstration;Tactile cues;Verbal cues    Comprehension Verbalized understanding;Returned demonstration;Verbal cues required;Tactile cues required;Need further instruction            PT Short Term Goals - 08/21/20 1947      PT SHORT TERM GOAL #1   Title Be independent with initial home exercise program for self-management of symptoms.    Baseline Initial HEP provided at IE (08/21/2020);    Time 3    Period Weeks    Status New    Target Date 09/11/20              PT Long Term Goals - 08/21/20 1947      PT LONG TERM GOAL #1   Title Be independent with a long-term home exercise program for self-management of symptoms    Baseline Initial HEP provided at IE (08/21/2020);    Time 12    Period Weeks    Status New   TARGET DATE FOR ALL LONG TERM GOALS: 11/13/2020     PT LONG TERM GOAL #2   Title Demonstrate improved FOTO score by 10 units to demonstrate improvement in overall condition and self-reported functional ability.    Baseline 65 (08/21/2020);    Time 12    Period Weeks    Status New      PT LONG TERM GOAL #3   Title Reduce pain with functional activities to equal or less than 3/10 to allow patient to complete usual activities including ADLs, IADLs, and social engagement with less difficulty.    Baseline 8/10 (08/21/2020);    Time 12    Period Weeks    Status New  PT LONG TERM GOAL #4   Title Patient will demonstrate equal or greater than 60 degrees cervical spine rotation each way to improve ability to check blind spot while driving.    Baseline Rotation: R= 50 pain at right ide of neck., L = 39 "arthritis ache at left" (08/21/2020);    Time 12    Period Weeks    Status New      PT LONG TERM GOAL #5   Title Complete community, work and/or recreational activities without limitation due to current condition    Baseline Functional Limitations: anything that requires repeated use of the right UE such as vacuuming, mopping, cleaning, repeated reaching, groceries, repeated use of right side, prolonged sitting, reclined sitting, walking, and lying down (08/21/2020);    Time 12    Period Weeks    Status New                 Plan - 08/28/20 1158    Clinical Impression Statement Patient tolerated treatment well overall with improved pain by end of session. I very tight and tender along the right side cervical spine musculature. Unclear why patient had increased pain and could be related to exercises or other factors. Advised to gently  reintroduce exercises with just retraction this week.  Patient would benefit from continued management of limiting condition by skilled physical therapist to address remaining impairments and functional limitations to work towards stated goals and return to PLOF or maximal functional independence.    Personal Factors and Comorbidities Age;Comorbidity 3+;Past/Current Experience;Fitness;Time since onset of injury/illness/exacerbation    Comorbidities anxiety, major depression (currently managed with medication to patient's satisfaction by psychiatrist), fibromyalgia, ADHD, psoriasis, plantar fasciitis, chronic back pain, chronic iritis (currently being managed).    Examination-Activity Limitations Carry;Reach Overhead;Other;Lift;Hygiene/Grooming;Dressing;Caring for Others;Bathing;Sleep    Examination-Participation Restrictions Cleaning;Community Activity;Interpersonal Relationship;Other;Valla Leaver Work;Laundry   anything that requires repeated use of the right UE such as vacuuming, mopping, cleaning, repeated reaching, groceries, repeated use of right side, prolonged sitting, reclined sitting, walking, and lying down.   Stability/Clinical Decision Making Stable/Uncomplicated    Rehab Potential Good    PT Frequency 2x / week    PT Duration 12 weeks    PT Treatment/Interventions ADLs/Self Care Home Management;Aquatic Therapy;Cryotherapy;Moist Heat;Therapeutic activities;Therapeutic exercise;Neuromuscular re-education;Cognitive remediation;Patient/family education;Manual techniques;Passive range of motion;Dry needling;Spinal Manipulations;Joint Manipulations;Biofeedback;Electrical Stimulation    PT Next Visit Plan manual therapy, assess response to HEP    PT Home Exercise Plan cervical retraction, deep flexor nod    Consulted and Agree with Plan of Care Patient           Patient will benefit from skilled therapeutic intervention in order to improve the following deficits and impairments:  Decreased endurance,  Hypomobility, Increased muscle spasms, Improper body mechanics, Impaired perceived functional ability, Decreased range of motion, Decreased activity tolerance, Decreased strength, Impaired flexibility, Impaired UE functional use, Pain, Postural dysfunction  Visit Diagnosis: Cervicalgia  Other muscle spasm  Radiculopathy, cervical region     Problem List Patient Active Problem List   Diagnosis Date Noted  . Plantar fasciitis 07/09/2014    Everlean Alstrom. Graylon Good, PT, DPT 08/28/20, 11:59 AM  [facility] [address] [place], [address] Phone: [phone]   Fax:  [phone]  Name: Cheryl Schroeder MRN: [id number] Date of Birth: 1956-04-06

## 2020-09-04 ENCOUNTER — Other Ambulatory Visit: Payer: Self-pay

## 2020-09-04 ENCOUNTER — Ambulatory Visit: Payer: Medicare HMO | Admitting: Physical Therapy

## 2020-09-04 ENCOUNTER — Encounter: Payer: Self-pay | Admitting: Physical Therapy

## 2020-09-04 DIAGNOSIS — M542 Cervicalgia: Secondary | ICD-10-CM | POA: Diagnosis not present

## 2020-09-04 DIAGNOSIS — M5412 Radiculopathy, cervical region: Secondary | ICD-10-CM

## 2020-09-04 DIAGNOSIS — M62838 Other muscle spasm: Secondary | ICD-10-CM

## 2020-09-04 NOTE — Therapy (Signed)
[facility] [address] [place], [address] Phone: [phone]   Fax:  [phone]  Physical Therapy Treatment  Patient Details  Name: Cheryl Schroeder MRN: [id number] Date of Birth: 1956/04/09 Referring Provider (PT): Havery Moros, NP    Encounter Date: 09/04/2020   PT End of Session - 09/04/20 1056    Visit Number 3    Number of Visits 24    Date for PT Re-Evaluation 11/13/20    Authorization Type [employer]IC[employer] reporting period from 08/21/2020    Authorization Time Period [employer] auth 24 PT 08/28/20 - 11/13/20 Authorization #[id number]    Authorization - Visit Number 2    Authorization - Number of Visits 24    Progress Note Due on Visit 10    PT Start Time 0945    PT Stop Time 1040    PT Time Calculation (min) 55 min    Activity Tolerance Patient tolerated treatment well;Patient limited by pain    Behavior During Therapy [facility] for tasks assessed/performed           Past Medical History:  Diagnosis Date  . Bipolar 2 disorder ([place])   . Fibromyalgia   . Psoriasis     Past Surgical History:  Procedure Laterality Date  . ABDOMINAL HYSTERECTOMY    . ADENOIDECTOMY    . BREAST BIOPSY Right 1990   cyst  . TONSILLECTOMY      There were no vitals filed for this visit.   Subjective Assessment - 09/04/20 0949    Subjective Pateint reports she continues to have pain in her neck and right arm above the elbow, rated 5/10 today. Arm pain is slightly less than before. She really feels the retraction/extension exercises are aggrevating her so she has not done much.    Pertinent History Patient is a 64 y.o. female who presents to outpatient physical therapy with a referral for medical diagnosis cervicalgia, radiculitis of right cervical region. This patient's chief complaints consist of right neck, shoulder, and head pain leading to the following functional deficits: difficulty with anything that requires  repeated use of the right UE such as vacuuming, mopping, cleaning, repeated reaching, groceries, repeated use of right side, prolonged sitting, reclined sitting, walking, and lying down. Relevant past medical history and comorbidities include anxiety, major depression (currently managed with medication to patient's satisfaction by psychiatrist), fibromyalgia, ADHD, psoriasis, plantar fasciitis, chronic back pain, chronic iritis (currently being managed).  States that her psychiatrists believes she has major depression instead of bipolar 2. Patient denies hx of cancer, stroke, seizures, lung problem, major cardiac events, diabetes, unexplained weight loss, changes in bowel or bladder problems, new onset stumbling or dropping things.    Limitations Other (comment);House hold activities;Standing;Walking;Lifting;Sitting   anything that requires repeated use of the right UE such as vacuuming, mopping, cleaning, repeated reaching, groceries, repeated use of right side, prolonged sitting, reclined sitting, walking, and lying down.   Diagnostic tests C-spine radiograph report 08/07/2020: "IMPRESSION:1. Mild C5-6 and C6-7 degenerative disc disease. Diffuse cervical spine facet osteoarthrosis.2. Slight C4 on C5 anterolisthesis present in flexion reduces in neutral and extension. Slight C5 on C6 anterolisthesis remains unchanged between flexion and extension.  "    Patient Stated Goals "I would like to be able to strengthen some of the arm muscles and make the pain better"    Currently in Pain? Yes    Pain Score 5     Pain Location Neck    Pain  Orientation Right             TREATMENT:  Therapeutic exercise:to centralize symptoms and improve ROM, strength, muscular endurance, and activity tolerance required for successful completion of functional activities.  Supine chin tuck 1x10 with 5 second hold prior to needling/manual (painful). 2x10 with 5 second hold following needling/manual therapy (much more  comfortable).   Supine R UT stretch x 30 seconds (uncomfortable)  Seated R and L UT and LS stretch x 30 seconds each side each movement. With hand weighted OP on left and just AROM on right. Uncomfortable.   Education on HEP including handout   Modality: (unbilled) Dry needling performed to cervical spine musculature to decrease pain and spasms along patient's neck and R UE with patient in prone lying utilizing (1) dry needle .19mm x 84mm with (3)sticks at R multifidus at approximately C5,C6, and C7 and (1) dry needle .57mm x 40 mm at right upper trap using threading technique. Patient educated about the risks and benefits from therapy and verbally consents to treatment.  Dry needling performed by Nancy Nordmann, PT, DPT who is certified in this technique.  Manual therapy: to reduce pain and tissue tension, improve range of motion, neuromodulation, in order to promote improved ability to complete functional activities. Prone position:  - STM to posterior cervical spine musculature R > L focusing on paraspinal muscles, upper trap, and levator scapulae.  - CPA grade III x5-10 reps from mid cervical spine to base of thoracic spine with more reps performed where it "feels good" or sore.  - R UPA grade III x10 reps to mid to lower C spine levels.  - supine PROM R: UT stretch 3x30 seconds and levator scap stretch 3x30 seconds.   HOME EXERCISE PROGRAM Access Code: [email or web address] URL: [email or web address] Date: 09/04/2020 Prepared by: Rosita Kea  Exercises Supine Chin Tuck - 3 sets - 10 reps - 5 second hold hold Gentle Levator Scapulae Stretch - 3 sets - 30 seconds hold Seated Gentle Upper Trapezius Stretch - 3 sets - 30 seconds hold     PT Education - 09/04/20 1058    Education Details Exercise purpose/form. Self management techniques. benefits and risks of dry needling    Person(s) Educated Patient    Methods Explanation;Demonstration;Tactile cues;Verbal cues;Handout     Comprehension Verbalized understanding;Returned demonstration;Verbal cues required;Tactile cues required;Need further instruction            PT Short Term Goals - 08/21/20 1947      PT SHORT TERM GOAL #1   Title Be independent with initial home exercise program for self-management of symptoms.    Baseline Initial HEP provided at IE (08/21/2020);    Time 3    Period Weeks    Status New    Target Date 09/11/20             PT Long Term Goals - 08/21/20 1947      PT LONG TERM GOAL #1   Title Be independent with a long-term home exercise program for self-management of symptoms    Baseline Initial HEP provided at IE (08/21/2020);    Time 12    Period Weeks    Status New   TARGET DATE FOR ALL LONG TERM GOALS: 11/13/2020     PT LONG TERM GOAL #2   Title Demonstrate improved FOTO score by 10 units to demonstrate improvement in overall condition and self-reported functional ability.    Baseline 65 (08/21/2020);    Time 12    Period  Weeks    Status New      PT LONG TERM GOAL #3   Title Reduce pain with functional activities to equal or less than 3/10 to allow patient to complete usual activities including ADLs, IADLs, and social engagement with less difficulty.    Baseline 8/10 (08/21/2020);    Time 12    Period Weeks    Status New      PT LONG TERM GOAL #4   Title Patient will demonstrate equal or greater than 60 degrees cervical spine rotation each way to improve ability to check blind spot while driving.    Baseline Rotation: R= 50 pain at right ide of neck., L = 39 "arthritis ache at left" (08/21/2020);    Time 12    Period Weeks    Status New      PT LONG TERM GOAL #5   Title Complete community, work and/or recreational activities without limitation due to current condition    Baseline Functional Limitations: anything that requires repeated use of the right UE such as vacuuming, mopping, cleaning, repeated reaching, groceries, repeated use of right side, prolonged  sitting, reclined sitting, walking, and lying down (08/21/2020);    Time 12    Period Weeks    Status New                 Plan - 09/04/20 1055    Clinical Impression Statement Patient does not appear to be responding well to upright extension principle and is not performing the exercises as prescribed due to discomfort. Implemented dry needling this session with manual therapy and stretching. Did have improved comfort of chin tuck exercise following needling and joint mob/STM but continued to feel sore after stretching. Updated HEP to include better tolerated chin tuck and stretching. Patient tolerated dry needling well and had good twitch response in upper trap region. Consider needling LS next session.  Patient would benefit from continued management of limiting condition by skilled physical therapist to address remaining impairments and functional limitations to work towards stated goals and return to PLOF or maximal functional independence.    Personal Factors and Comorbidities Age;Comorbidity 3+;Past/Current Experience;Fitness;Time since onset of injury/illness/exacerbation    Comorbidities anxiety, major depression (currently managed with medication to patient's satisfaction by psychiatrist), fibromyalgia, ADHD, psoriasis, plantar fasciitis, chronic back pain, chronic iritis (currently being managed).    Examination-Activity Limitations Carry;Reach Overhead;Other;Lift;Hygiene/Grooming;Dressing;Caring for Others;Bathing;Sleep    Examination-Participation Restrictions Cleaning;Community Activity;Interpersonal Relationship;Other;Valla Leaver Work;Laundry   anything that requires repeated use of the right UE such as vacuuming, mopping, cleaning, repeated reaching, groceries, repeated use of right side, prolonged sitting, reclined sitting, walking, and lying down.   Stability/Clinical Decision Making Stable/Uncomplicated    Rehab Potential Good    PT Frequency 2x / week    PT Duration 12 weeks    PT  Treatment/Interventions ADLs/Self Care Home Management;Aquatic Therapy;Cryotherapy;Moist Heat;Therapeutic activities;Therapeutic exercise;Neuromuscular re-education;Cognitive remediation;Patient/family education;Manual techniques;Passive range of motion;Dry needling;Spinal Manipulations;Joint Manipulations;Biofeedback;Electrical Stimulation    PT Next Visit Plan manual therapy, assess response to HEP    PT Home Exercise Plan Medbridge Access Code: [email or web address]    Consulted and Agree with Plan of Care Patient           Patient will benefit from skilled therapeutic intervention in order to improve the following deficits and impairments:  Decreased endurance, Hypomobility, Increased muscle spasms, Improper body mechanics, Impaired perceived functional ability, Decreased range of motion, Decreased activity tolerance, Decreased strength, Impaired flexibility, Impaired UE functional use, Pain, Postural dysfunction  Visit Diagnosis:  Cervicalgia  Other muscle spasm  Radiculopathy, cervical region     Problem List Patient Active Problem List   Diagnosis Date Noted  . Plantar fasciitis 07/09/2014    Everlean Alstrom. Graylon Good, PT, DPT 09/04/20, 11:00 AM  [facility] [address], [place], [address] Phone: [phone]   Fax:  [phone]  Name: SHONDRIKA HOQUE MRN: [id number] Date of Birth: 15-Jan-1956

## 2020-09-06 ENCOUNTER — Other Ambulatory Visit: Payer: Self-pay

## 2020-09-06 ENCOUNTER — Ambulatory Visit
Admission: RE | Admit: 2020-09-06 | Discharge: 2020-09-06 | Disposition: A | Discharge status: Home / Self Care | Payer: Medicare HMO | Source: Ambulatory Visit | Attending: Family Medicine | Admitting: Family Medicine

## 2020-09-06 DIAGNOSIS — Z1231 Encounter for screening mammogram for malignant neoplasm of breast: Secondary | ICD-10-CM | POA: Diagnosis present

## 2020-09-10 ENCOUNTER — Ambulatory Visit: Payer: Medicare HMO | Attending: Orthopedic | Admitting: Physical Therapy

## 2020-09-10 ENCOUNTER — Telehealth: Payer: Self-pay | Admitting: Physical Therapy

## 2020-09-10 DIAGNOSIS — M5412 Radiculopathy, cervical region: Secondary | ICD-10-CM | POA: Insufficient documentation

## 2020-09-10 DIAGNOSIS — M62838 Other muscle spasm: Secondary | ICD-10-CM | POA: Insufficient documentation

## 2020-09-10 DIAGNOSIS — M542 Cervicalgia: Secondary | ICD-10-CM | POA: Insufficient documentation

## 2020-09-10 NOTE — Telephone Encounter (Signed)
Called patient when she did not come to her PT appt this morning at 10:30am. She answered and said she thought it was tomorrow. Conformed her next appointment at 9:45 am thurdsay 09/13/20. Patient was very apologetic and said she had read the schedule wrong since it starts on Monday on the paper and she assumed it starts on Sunday.   Everlean Alstrom. Graylon Good, PT, DPT 09/10/20, 11:09 AM

## 2020-09-13 ENCOUNTER — Other Ambulatory Visit: Payer: Self-pay | Admitting: Family Medicine

## 2020-09-13 ENCOUNTER — Encounter: Payer: Self-pay | Admitting: Physical Therapy

## 2020-09-13 ENCOUNTER — Ambulatory Visit: Payer: Medicare HMO | Admitting: Physical Therapy

## 2020-09-13 ENCOUNTER — Other Ambulatory Visit: Payer: Self-pay

## 2020-09-13 DIAGNOSIS — M62838 Other muscle spasm: Secondary | ICD-10-CM

## 2020-09-13 DIAGNOSIS — R928 Other abnormal and inconclusive findings on diagnostic imaging of breast: Secondary | ICD-10-CM

## 2020-09-13 DIAGNOSIS — M5412 Radiculopathy, cervical region: Secondary | ICD-10-CM

## 2020-09-13 DIAGNOSIS — N6320 Unspecified lump in the left breast, unspecified quadrant: Secondary | ICD-10-CM

## 2020-09-13 DIAGNOSIS — M542 Cervicalgia: Secondary | ICD-10-CM

## 2020-09-13 NOTE — Therapy (Signed)
[facility] [address] [place], [address] Phone: [phone]   Fax:  [phone]  Physical Therapy Treatment  Patient Details  Name: Cheryl Schroeder MRN: [id number] Date of Birth: 1956-06-25 Referring Provider (PT): Havery Moros, NP    Encounter Date: 09/13/2020   PT End of Session - 09/13/20 1043    Visit Number 4    Number of Visits 24    Date for PT Re-Evaluation 11/13/20    Authorization Type [employer]IC[employer] reporting period from 08/21/2020    Authorization Time Period [employer] auth 24 PT 08/28/20 - 11/13/20 Authorization #[id number]    Authorization - Visit Number 3    Authorization - Number of Visits 24    Progress Note Due on Visit 10    PT Start Time 0950    PT Stop Time 1035    PT Time Calculation (min) 45 min    Activity Tolerance Patient tolerated treatment well;Patient limited by pain    Behavior During Therapy [facility] for tasks assessed/performed           Past Medical History:  Diagnosis Date  . Bipolar 2 disorder ([place])   . Fibromyalgia   . Psoriasis     Past Surgical History:  Procedure Laterality Date  . ABDOMINAL HYSTERECTOMY    . ADENOIDECTOMY    . BREAST BIOPSY Right 1990   cyst  . TONSILLECTOMY      There were no vitals filed for this visit.   Subjective Assessment - 09/13/20 0952    Subjective Patient reports her pain is 6/10 today both shoulders, a bit more on the R than the L. She has been doing her HEP and her pain came closer to the middle. She is also doing the sitting exerciss and felt like the pain centralized a bit. She thinks her shoulders are really tight and tender from stress and fibromyalgia response to usual activity and busy days. She liked the dry needling and it was sore at first but felt better after a day or two it helped.    Pertinent History Patient is a 64 y.o. female who presents to outpatient physical therapy with a referral for medical diagnosis  cervicalgia, radiculitis of right cervical region. This patient's chief complaints consist of right neck, shoulder, and head pain leading to the following functional deficits: difficulty with anything that requires repeated use of the right UE such as vacuuming, mopping, cleaning, repeated reaching, groceries, repeated use of right side, prolonged sitting, reclined sitting, walking, and lying down. Relevant past medical history and comorbidities include anxiety, major depression (currently managed with medication to patient's satisfaction by psychiatrist), fibromyalgia, ADHD, psoriasis, plantar fasciitis, chronic back pain, chronic iritis (currently being managed).  States that her psychiatrists believes she has major depression instead of bipolar 2. Patient denies hx of cancer, stroke, seizures, lung problem, major cardiac events, diabetes, unexplained weight loss, changes in bowel or bladder problems, new onset stumbling or dropping things.    Limitations Other (comment);House hold activities;Standing;Walking;Lifting;Sitting   anything that requires repeated use of the right UE such as vacuuming, mopping, cleaning, repeated reaching, groceries, repeated use of right side, prolonged sitting, reclined sitting, walking, and lying down.   Diagnostic tests C-spine radiograph report 08/07/2020: "IMPRESSION:1. Mild C5-6 and C6-7 degenerative disc disease. Diffuse cervical spine facet osteoarthrosis.2. Slight C4 on C5 anterolisthesis present in flexion reduces in neutral and extension. Slight C5 on C6 anterolisthesis remains unchanged between flexion and extension.  "  Patient Stated Goals "I would like to be able to strengthen some of the arm muscles and make the pain better"    Currently in Pain? Yes    Pain Score 6           TREATMENT:  Manual therapy:to reduce pain and tissue tension, improve range of motion, neuromodulation, in order to promote improved ability to complete functional  activities. Supine position:  - STM to posterior cervical spine musculature R > L focusing on paraspinal muscles, upper trap, and levator scapulae.   Modality: (unbilled) Dry needling performed to cervical spine musculature to decrease pain and spasms along patient's neck and R UE with patient in prone lying utilizing (1) dry needle .42mm x 30mm with (4)sticks along right upper trap using threading technique. Patient educated about the risks and benefits from therapy and verbally consents to treatment. Dry needling performed by Nancy Nordmann, PT, DPT who is certified in this technique.    HOME EXERCISE PROGRAM Access Code: [email or web address] URL: [email or web address] Date: 09/04/2020 Prepared by: Rosita Kea  Exercises Supine Chin Tuck - 3 sets - 10 reps - 5 second hold hold Gentle Levator Scapulae Stretch - 3 sets - 30 seconds hold Seated Gentle Upper Trapezius Stretch - 3 sets - 30 seconds hold     PT Education - 09/13/20 1043    Education Details Exercise purpose/form. Self management techniques. benefits and risks of dry needling    Person(s) Educated Patient    Methods Explanation;Demonstration;Tactile cues;Verbal cues    Comprehension Verbalized understanding;Returned demonstration;Verbal cues required;Tactile cues required;Need further instruction            PT Short Term Goals - 08/21/20 1947      PT SHORT TERM GOAL #1   Title Be independent with initial home exercise program for self-management of symptoms.    Baseline Initial HEP provided at IE (08/21/2020);    Time 3    Period Weeks    Status New    Target Date 09/11/20             PT Long Term Goals - 08/21/20 1947      PT LONG TERM GOAL #1   Title Be independent with a long-term home exercise program for self-management of symptoms    Baseline Initial HEP provided at IE (08/21/2020);    Time 12    Period Weeks    Status New   TARGET DATE FOR ALL LONG TERM GOALS: 11/13/2020     PT LONG  TERM GOAL #2   Title Demonstrate improved FOTO score by 10 units to demonstrate improvement in overall condition and self-reported functional ability.    Baseline 65 (08/21/2020);    Time 12    Period Weeks    Status New      PT LONG TERM GOAL #3   Title Reduce pain with functional activities to equal or less than 3/10 to allow patient to complete usual activities including ADLs, IADLs, and social engagement with less difficulty.    Baseline 8/10 (08/21/2020);    Time 12    Period Weeks    Status New      PT LONG TERM GOAL #4   Title Patient will demonstrate equal or greater than 60 degrees cervical spine rotation each way to improve ability to check blind spot while driving.    Baseline Rotation: R= 50 pain at right ide of neck., L = 39 "arthritis ache at left" (08/21/2020);    Time 12    Period  Weeks    Status New      PT LONG TERM GOAL #5   Title Complete community, work and/or recreational activities without limitation due to current condition    Baseline Functional Limitations: anything that requires repeated use of the right UE such as vacuuming, mopping, cleaning, repeated reaching, groceries, repeated use of right side, prolonged sitting, reclined sitting, walking, and lying down (08/21/2020);    Time 12    Period Weeks    Status New                 Plan - 09/13/20 1053    Clinical Impression Statement Patient very tender and tight in the posterior cervical spine musculature and required extended time with manual therapy and needling to help relax that region and reported improved pain by end of session. Patient would benefit from continued management of limiting condition by skilled physical therapist to address remaining impairments and functional limitations to work towards stated goals and return to PLOF or maximal functional independence.    Personal Factors and Comorbidities Age;Comorbidity 3+;Past/Current Experience;Fitness;Time since onset of  injury/illness/exacerbation    Comorbidities anxiety, major depression (currently managed with medication to patient's satisfaction by psychiatrist), fibromyalgia, ADHD, psoriasis, plantar fasciitis, chronic back pain, chronic iritis (currently being managed).    Examination-Activity Limitations Carry;Reach Overhead;Other;Lift;Hygiene/Grooming;Dressing;Caring for Others;Bathing;Sleep    Examination-Participation Restrictions Cleaning;Community Activity;Interpersonal Relationship;Other;Valla Leaver Work;Laundry   anything that requires repeated use of the right UE such as vacuuming, mopping, cleaning, repeated reaching, groceries, repeated use of right side, prolonged sitting, reclined sitting, walking, and lying down.   Stability/Clinical Decision Making Stable/Uncomplicated    Rehab Potential Good    PT Frequency 2x / week    PT Duration 12 weeks    PT Treatment/Interventions ADLs/Self Care Home Management;Aquatic Therapy;Cryotherapy;Moist Heat;Therapeutic activities;Therapeutic exercise;Neuromuscular re-education;Cognitive remediation;Patient/family education;Manual techniques;Passive range of motion;Dry needling;Spinal Manipulations;Joint Manipulations;Biofeedback;Electrical Stimulation    PT Next Visit Plan manual therapy, assess response to HEP    PT Home Exercise Plan Medbridge Access Code: [email or web address]    Consulted and Agree with Plan of Care Patient           Patient will benefit from skilled therapeutic intervention in order to improve the following deficits and impairments:  Decreased endurance,Hypomobility,Increased muscle spasms,Improper body mechanics,Impaired perceived functional ability,Decreased range of motion,Decreased activity tolerance,Decreased strength,Impaired flexibility,Impaired UE functional use,Pain,Postural dysfunction  Visit Diagnosis: Cervicalgia  Other muscle spasm  Radiculopathy, cervical region     Problem List Patient Active Problem List   Diagnosis Date Noted  .  Plantar fasciitis 07/09/2014   Everlean Alstrom. Graylon Good, PT, DPT 09/13/20, 10:54 AM  [facility] [address], [place], [address] Phone: [phone]   Fax:  [phone]  Name: Cheryl Schroeder MRN: [id number] Date of Birth: Nov 02, 1955

## 2020-09-17 ENCOUNTER — Other Ambulatory Visit: Payer: Self-pay

## 2020-09-17 ENCOUNTER — Ambulatory Visit: Payer: Medicare HMO | Admitting: Physical Therapy

## 2020-09-17 ENCOUNTER — Encounter: Payer: Self-pay | Admitting: Physical Therapy

## 2020-09-17 DIAGNOSIS — M5412 Radiculopathy, cervical region: Secondary | ICD-10-CM

## 2020-09-17 DIAGNOSIS — M542 Cervicalgia: Secondary | ICD-10-CM | POA: Diagnosis not present

## 2020-09-17 DIAGNOSIS — M62838 Other muscle spasm: Secondary | ICD-10-CM

## 2020-09-17 NOTE — Therapy (Signed)
[facility] [address], [place], [address] Phone: [phone]   Fax:  [phone]  Physical Therapy Treatment  Patient Details  Name: Cheryl Schroeder MRN: [id number] Date of Birth: 1956-09-22 Referring Provider (PT): Havery Moros, NP    Encounter Date: 09/17/2020   PT End of Session - 09/17/20 1050    Visit Number 5    Number of Visits 24    Date for PT Re-Evaluation 11/13/20    Authorization Type [employer] MEDIC[employer] reporting period from 08/21/2020    Authorization Time Period [employer] auth 24 PT 08/28/20 - 11/13/20 Authorization #[id number]    Authorization - Visit Number 4    Authorization - Number of Visits 24    Progress Note Due on Visit 10    PT Start Time 0950    PT Stop Time 1037    PT Time Calculation (min) 47 min    Activity Tolerance Patient limited by pain    Behavior During Therapy [facility] for tasks assessed/performed           Past Medical History:  Diagnosis Date  . Bipolar 2 disorder ([place])   . Fibromyalgia   . Psoriasis     Past Surgical History:  Procedure Laterality Date  . ABDOMINAL HYSTERECTOMY    . ADENOIDECTOMY    . BREAST BIOPSY Right 1990   cyst  . TONSILLECTOMY      There were no vitals filed for this visit.   Subjective Assessment - 09/17/20 0949    Subjective Patient reports she has 5-6/10 pain in bilateral upper traps, both sides the same. R arm is feeling better but still bothers her when she does certain things like vaccuming. Felt like her pain was better after last session. Would like to do needling and exercies today. Demonstrates cervical flexion, retraction, and B sidebending, stating they hurt and sometimes for a long time following, especially side bending.    Pertinent History Patient is a 64 y.o. female who presents to outpatient physical therapy with a referral for medical diagnosis cervicalgia, radiculitis of right cervical region. This patient's chief  complaints consist of right neck, shoulder, and head pain leading to the following functional deficits: difficulty with anything that requires repeated use of the right UE such as vacuuming, mopping, cleaning, repeated reaching, groceries, repeated use of right side, prolonged sitting, reclined sitting, walking, and lying down. Relevant past medical history and comorbidities include anxiety, major depression (currently managed with medication to patient's satisfaction by psychiatrist), fibromyalgia, ADHD, psoriasis, plantar fasciitis, chronic back pain, chronic iritis (currently being managed).  States that her psychiatrists believes she has major depression instead of bipolar 2. Patient denies hx of cancer, stroke, seizures, lung problem, major cardiac events, diabetes, unexplained weight loss, changes in bowel or bladder problems, new onset stumbling or dropping things.    Limitations Other (comment);House hold activities;Standing;Walking;Lifting;Sitting   anything that requires repeated use of the right UE such as vacuuming, mopping, cleaning, repeated reaching, groceries, repeated use of right side, prolonged sitting, reclined sitting, walking, and lying down.   Diagnostic tests C-spine radiograph report 08/07/2020: "IMPRESSION:1. Mild C5-6 and C6-7 degenerative disc disease. Diffuse cervical spine facet osteoarthrosis.2. Slight C4 on C5 anterolisthesis present in flexion reduces in neutral and extension. Slight C5 on C6 anterolisthesis remains unchanged between flexion and extension.  "    Patient Stated Goals "I would like to be able to strengthen some of the arm muscles and make the pain better"  Currently in Pain? Yes    Pain Score 6     Pain Location Neck             TREATMENT:  Manual therapy:to reduce pain and tissue tension, improve range of motion, neuromodulation, in order to promote improved ability to complete functional activities. Supine and prone position:  - STM to posterior  cervical spine musculature R >L focusing on paraspinal muscles, upper trap, and levator scapulae.  Prone:  - CPA grade III along cervical spine (no improvement with repetition).   Modality: (unbilled) Dry needling performed tocervical spine musculatureto decrease pain and spasms alongpatient's neck and R UEwith patient in prone lyingutilizing (1) dry needle .2mm x36mmwith (2)sticks along right upper trap and (1) stick along left upper trap using threading technique and (1) dry needle .27mm x39mmwith (1)sticks each at rightand left cervical multifidi near C4 and (1) stick along right semispinalis. Patient educated about the risks and benefits from therapy and verbally consents to treatment. Dry needling performed bySara Carin Primrose, PT, DPT who is certified in this technique.   Therapeutic exercise: to centralize symptoms and improve ROM, strength, muscular endurance, and activity tolerance required for successful completion of functional activities.  - seated AROM cervical spine flexion, retraction, sidebeding each side x1-2 reps. Cuing to take it only to where it is uncomfortable and not push into pain.  - supine deep neck flexor chin tuck, 5 second hold 1x 10, 1x5, 10 second hold x 10. - supine cervical spine rotation x 10 each side attempting not to push into pain.  - review of HEP (deep neck flexor training, gentle cervical AROM focusing on not provoking pain and improving tolerance for motion).     PT Education - 09/17/20 1051    Education Details Exercise purpose/form. Self management techniques. benefits and risks of dry needling    Person(s) Educated Patient    Methods Explanation;Demonstration;Tactile cues;Verbal cues    Comprehension Verbalized understanding;Returned demonstration;Verbal cues required;Tactile cues required;Need further instruction            PT Short Term Goals - 09/17/20 1052      PT SHORT TERM GOAL #1   Title Be independent with initial home  exercise program for self-management of symptoms.    Baseline Initial HEP provided at IE (08/21/2020);    Time 3    Period Weeks    Status Achieved    Target Date 09/11/20             PT Long Term Goals - 08/21/20 1947      PT LONG TERM GOAL #1   Title Be independent with a long-term home exercise program for self-management of symptoms    Baseline Initial HEP provided at IE (08/21/2020);    Time 12    Period Weeks    Status New   TARGET DATE FOR ALL LONG TERM GOALS: 11/13/2020     PT LONG TERM GOAL #2   Title Demonstrate improved FOTO score by 10 units to demonstrate improvement in overall condition and self-reported functional ability.    Baseline 65 (08/21/2020);    Time 12    Period Weeks    Status New      PT LONG TERM GOAL #3   Title Reduce pain with functional activities to equal or less than 3/10 to allow patient to complete usual activities including ADLs, IADLs, and social engagement with less difficulty.    Baseline 8/10 (08/21/2020);    Time 12    Period Weeks  Status New      PT LONG TERM GOAL #4   Title Patient will demonstrate equal or greater than 60 degrees cervical spine rotation each way to improve ability to check blind spot while driving.    Baseline Rotation: R= 50 pain at right ide of neck., L = 39 "arthritis ache at left" (08/21/2020);    Time 12    Period Weeks    Status New      PT LONG TERM GOAL #5   Title Complete community, work and/or recreational activities without limitation due to current condition    Baseline Functional Limitations: anything that requires repeated use of the right UE such as vacuuming, mopping, cleaning, repeated reaching, groceries, repeated use of right side, prolonged sitting, reclined sitting, walking, and lying down (08/21/2020);    Time 12    Period Weeks    Status New                 Plan - 09/17/20 1049    Clinical Impression Statement Patient tolerated treatment well overall. Continues to be very  sore at right cervical paraspinal muscles and bilateral upper trap and levator scap, R >L. More tolerant of the 0.35mm dry needle. Minimal to no twitch response but muscles very tight to touch. Patient would benefit from continued management of limiting condition by skilled physical therapist to address remaining impairments and functional limitations to work towards stated goals and return to PLOF or maximal functional independence.    Personal Factors and Comorbidities Age;Comorbidity 3+;Past/Current Experience;Fitness;Time since onset of injury/illness/exacerbation    Comorbidities anxiety, major depression (currently managed with medication to patient's satisfaction by psychiatrist), fibromyalgia, ADHD, psoriasis, plantar fasciitis, chronic back pain, chronic iritis (currently being managed).    Examination-Activity Limitations Carry;Reach Overhead;Other;Lift;Hygiene/Grooming;Dressing;Caring for Others;Bathing;Sleep    Examination-Participation Restrictions Cleaning;Community Activity;Interpersonal Relationship;Other;Valla Leaver Work;Laundry   anything that requires repeated use of the right UE such as vacuuming, mopping, cleaning, repeated reaching, groceries, repeated use of right side, prolonged sitting, reclined sitting, walking, and lying down.   Stability/Clinical Decision Making Stable/Uncomplicated    Rehab Potential Good    PT Frequency 2x / week    PT Duration 12 weeks    PT Treatment/Interventions ADLs/Self Care Home Management;Aquatic Therapy;Cryotherapy;Moist Heat;Therapeutic activities;Therapeutic exercise;Neuromuscular re-education;Cognitive remediation;Patient/family education;Manual techniques;Passive range of motion;Dry needling;Spinal Manipulations;Joint Manipulations;Biofeedback;Electrical Stimulation    PT Next Visit Plan manual therapy, gentle exercises    PT Home Exercise Plan Medbridge Access Code: [email or web address]    Consulted and Agree with Plan of Care Patient           Patient will  benefit from skilled therapeutic intervention in order to improve the following deficits and impairments:  Decreased endurance,Hypomobility,Increased muscle spasms,Improper body mechanics,Impaired perceived functional ability,Decreased range of motion,Decreased activity tolerance,Decreased strength,Impaired flexibility,Impaired UE functional use,Pain,Postural dysfunction  Visit Diagnosis: Cervicalgia  Other muscle spasm  Radiculopathy, cervical region     Problem List Patient Active Problem List   Diagnosis Date Noted  . Plantar fasciitis 07/09/2014    Everlean Alstrom. Graylon Good, PT, DPT 09/17/20, 10:52 AM  [facility] [address] [place], [address] Phone: [phone]   Fax:  [phone]  Name: Cheryl Schroeder MRN: [id number] Date of Birth: 08/18/1956

## 2020-09-19 ENCOUNTER — Ambulatory Visit: Payer: Medicare HMO | Admitting: Physical Therapy

## 2020-09-19 ENCOUNTER — Encounter: Payer: Self-pay | Admitting: Physical Therapy

## 2020-09-19 ENCOUNTER — Other Ambulatory Visit: Payer: Self-pay

## 2020-09-19 DIAGNOSIS — M542 Cervicalgia: Secondary | ICD-10-CM

## 2020-09-19 DIAGNOSIS — M62838 Other muscle spasm: Secondary | ICD-10-CM

## 2020-09-19 DIAGNOSIS — M5412 Radiculopathy, cervical region: Secondary | ICD-10-CM

## 2020-09-19 NOTE — Therapy (Signed)
[facility] [address], [place], [address] Phone: [phone]   Fax:  [phone]  Physical Therapy Treatment  Patient Details  Name: Cheryl Schroeder MRN: [id number] Date of Birth: [birth year] Referring Provider (PT): Havery Moros, NP    Encounter Date: 09/19/2020   PT End of Session - 09/19/20 1425    Visit Number 6    Number of Visits 24    Date for PT Re-Evaluation 11/13/20    Authorization Type [employer]IC[employer] reporting period from 08/21/2020    Authorization Time Period [employer] auth 24 PT 08/28/20 - 11/13/20 Authorization #[id number]    Authorization - Visit Number 5    Authorization - Number of Visits 24    Progress Note Due on Visit 10    PT Start Time 1300    PT Stop Time 1345    PT Time Calculation (min) 45 min    Activity Tolerance Patient limited by pain    Behavior During Therapy [facility] for tasks assessed/performed           Past Medical History:  Diagnosis Date  . Bipolar 2 disorder ([place])   . Fibromyalgia   . Psoriasis     Past Surgical History:  Procedure Laterality Date  . ABDOMINAL HYSTERECTOMY    . ADENOIDECTOMY    . BREAST BIOPSY Right 1990   cyst  . TONSILLECTOMY      There were no vitals filed for this visit.   Subjective Assessment - 09/19/20 1300    Subjective Pateint reports she thinks she is feeling better overall. She has been doing her home exercises with less vigor which seems to be helping. She feels like her pain is reduced from when she started PT and it is more even over both sides of her neck. She rates it 4/10 today. She has some questions about abdominal exercises, because she feels like she needs a strong core and this time of year is when she is not out walking as much.    Pertinent History Patient is a 64 y.o. female who presents to outpatient physical therapy with a referral for medical diagnosis cervicalgia, radiculitis of right cervical region. This  patient's chief complaints consist of right neck, shoulder, and head pain leading to the following functional deficits: difficulty with anything that requires repeated use of the right UE such as vacuuming, mopping, cleaning, repeated reaching, groceries, repeated use of right side, prolonged sitting, reclined sitting, walking, and lying down. Relevant past medical history and comorbidities include anxiety, major depression (currently managed with medication to patient's satisfaction by psychiatrist), fibromyalgia, ADHD, psoriasis, plantar fasciitis, chronic back pain, chronic iritis (currently being managed).  States that her psychiatrists believes she has major depression instead of bipolar 2. Patient denies hx of cancer, stroke, seizures, lung problem, major cardiac events, diabetes, unexplained weight loss, changes in bowel or bladder problems, new onset stumbling or dropping things.    Limitations Other (comment);House hold activities;Standing;Walking;Lifting;Sitting   anything that requires repeated use of the right UE such as vacuuming, mopping, cleaning, repeated reaching, groceries, repeated use of right side, prolonged sitting, reclined sitting, walking, and lying down.   Diagnostic tests C-spine radiograph report 08/07/2020: "IMPRESSION:1. Mild C5-6 and C6-7 degenerative disc disease. Diffuse cervical spine facet osteoarthrosis.2. Slight C4 on C5 anterolisthesis present in flexion reduces in neutral and extension. Slight C5 on C6 anterolisthesis remains unchanged between flexion and extension.  "    Patient Stated Goals "I would like to  be able to strengthen some of the arm muscles and make the pain better"    Currently in Pain? Yes    Pain Score 4     Pain Location Neck             TREATMENT:  Therapeutic exercise:to centralize symptoms and improve ROM, strength, muscular endurance, and activity tolerance required for successful completion of functional activities.  - education and goal  setting for exercise program for improved overall strength, health, and wellbeing.  - supine abdominal brace with self palpation and breathing x 10 - supine abdominal brace with single leg marching, x 5 each side - hooklying abdominal brace with continuous marching, x10 each side - hookying abdominal brace with table top to alternating heel taps, x 10 each side - hooklying abdominal brace with table top to alternating LE extension, progressing to with alternating contralateral shoulder flexion (dead bug). x10 each side - Education on HEP including handout   Manual therapy:to reduce pain and tissue tension, improve range of motion, neuromodulation, in order to promote improved ability to complete functional activities. Supine and proneposition:  - STM to posterior cervical spine musculature R >L focusing on paraspinal muscles, upper trap, and levator scapulae.   HOME EXERCISE PROGRAM Access Code: [email or web address] URL: [email or web address] Date: 09/19/2020 Prepared by: Rosita Kea  Program Notes Goals: Aerobic exercise (walking or tape) for 30 minutes + core exercises  2x a week (average of 1-3 days between each session) . Reminders: not yet established   Exercises Supine Chin Tuck - 3 sets - 10 reps - 5 second hold hold Gentle Levator Scapulae Stretch - 3 sets - 30 seconds hold Seated Gentle Upper Trapezius Stretch - 3 sets - 30 seconds hold Supine Transversus Abdominis Bracing - Hands on Stomach - 3 x weekly - 1 sets - 20 reps Supine Dead Bug with Leg Extension - 3 x weekly - 3 sets - 10 reps     PT Education - 09/19/20 1427    Education Details Exercise purpose/form. Self management techniques. motivational and practical strategies for establishing and maintaining exercise routine to meet patient's goals of improved exercise adherance.    Person(s) Educated Patient    Methods Explanation;Demonstration;Verbal cues;Tactile cues;Handout    Comprehension Verbalized  understanding;Returned demonstration;Verbal cues required;Tactile cues required;Need further instruction            PT Short Term Goals - 09/17/20 1052      PT SHORT TERM GOAL #1   Title Be independent with initial home exercise program for self-management of symptoms.    Baseline Initial HEP provided at IE (08/21/2020);    Time 3    Period Weeks    Status Achieved    Target Date 09/11/20             PT Long Term Goals - 08/21/20 1947      PT LONG TERM GOAL #1   Title Be independent with a long-term home exercise program for self-management of symptoms    Baseline Initial HEP provided at IE (08/21/2020);    Time 12    Period Weeks    Status New   TARGET DATE FOR ALL LONG TERM GOALS: 11/13/2020     PT LONG TERM GOAL #2   Title Demonstrate improved FOTO score by 10 units to demonstrate improvement in overall condition and self-reported functional ability.    Baseline 65 (08/21/2020);    Time 12    Period Weeks    Status New  PT LONG TERM GOAL #3   Title Reduce pain with functional activities to equal or less than 3/10 to allow patient to complete usual activities including ADLs, IADLs, and social engagement with less difficulty.    Baseline 8/10 (08/21/2020);    Time 12    Period Weeks    Status New      PT LONG TERM GOAL #4   Title Patient will demonstrate equal or greater than 60 degrees cervical spine rotation each way to improve ability to check blind spot while driving.    Baseline Rotation: R= 50 pain at right ide of neck., L = 39 "arthritis ache at left" (08/21/2020);    Time 12    Period Weeks    Status New      PT LONG TERM GOAL #5   Title Complete community, work and/or recreational activities without limitation due to current condition    Baseline Functional Limitations: anything that requires repeated use of the right UE such as vacuuming, mopping, cleaning, repeated reaching, groceries, repeated use of right side, prolonged sitting, reclined sitting,  walking, and lying down (08/21/2020);    Time 12    Period Weeks    Status New                 Plan - 09/19/20 1425    Clinical Impression Statement Patient tolerated treatment well and appeared to benefit from extended discussion with support in how to incorporate a generalized exercise program for health into her HEP as well as addressing her questions and concerns about core strength, general fitness/conditioning in the context of some of her struggles with motivation and organization that she feels is affected by ADHD. Did describe some difficulties with food and eating that resemble attitudes and difficulties found in those who struggle with disordered eating. Recommended seeking further evaluation and/or assistance from qualified professionals if this is bothering her enough to affect her health, function, or wellbeing. Patient agreed to seek further assessment if she felt it was necessary. Patient also finds her neck pain and generalized pain interferes with physical activity for overall health, so she was educated in modifications to exercises that would be less irritating to her neck. Overall her neck is still quite painful today, but appears to be improved from initial eval and gradually getting better. Patient would benefit from continued management of limiting condition by skilled physical therapist to address remaining impairments and functional limitations to work towards stated goals and return to PLOF or maximal functional independence.    Personal Factors and Comorbidities Age;Comorbidity 3+;Past/Current Experience;Fitness;Time since onset of injury/illness/exacerbation    Comorbidities anxiety, major depression (currently managed with medication to patient's satisfaction by psychiatrist), fibromyalgia, ADHD, psoriasis, plantar fasciitis, chronic back pain, chronic iritis (currently being managed).    Examination-Activity Limitations Carry;Reach  Overhead;Other;Lift;Hygiene/Grooming;Dressing;Caring for Others;Bathing;Sleep    Examination-Participation Restrictions Cleaning;Community Activity;Interpersonal Relationship;Other;Valla Leaver Work;Laundry   anything that requires repeated use of the right UE such as vacuuming, mopping, cleaning, repeated reaching, groceries, repeated use of right side, prolonged sitting, reclined sitting, walking, and lying down.   Stability/Clinical Decision Making Stable/Uncomplicated    Rehab Potential Good    PT Frequency 2x / week    PT Duration 12 weeks    PT Treatment/Interventions ADLs/Self Care Home Management;Aquatic Therapy;Cryotherapy;Moist Heat;Therapeutic activities;Therapeutic exercise;Neuromuscular re-education;Cognitive remediation;Patient/family education;Manual techniques;Passive range of motion;Dry needling;Spinal Manipulations;Joint Manipulations;Biofeedback;Electrical Stimulation    PT Next Visit Plan manual therapy, gentle exercises    PT Home Exercise Plan Medbridge Access Code: [email or web address]  Consulted and Agree with Plan of Care Patient           Patient will benefit from skilled therapeutic intervention in order to improve the following deficits and impairments:  Decreased endurance,Hypomobility,Increased muscle spasms,Improper body mechanics,Impaired perceived functional ability,Decreased range of motion,Decreased activity tolerance,Decreased strength,Impaired flexibility,Impaired UE functional use,Pain,Postural dysfunction  Visit Diagnosis: Cervicalgia  Other muscle spasm  Radiculopathy, cervical region     Problem List Patient Active Problem List   Diagnosis Date Noted  . Plantar fasciitis 07/09/2014    Everlean Alstrom. Graylon Good, PT, DPT 09/19/20, 2:29 PM  [facility] [address], [place], [address] Phone: [phone]   Fax:  [phone]  Name: ALAHNI VARONE MRN: [id number] Date of Birth: 07-Sep-1956

## 2020-09-24 ENCOUNTER — Ambulatory Visit
Admission: RE | Admit: 2020-09-24 | Discharge: 2020-09-24 | Disposition: A | Discharge status: Home / Self Care | Payer: Medicare HMO | Source: Ambulatory Visit | Attending: Family Medicine | Admitting: Family Medicine

## 2020-09-24 ENCOUNTER — Other Ambulatory Visit: Payer: Self-pay

## 2020-09-24 DIAGNOSIS — N6320 Unspecified lump in the left breast, unspecified quadrant: Secondary | ICD-10-CM | POA: Insufficient documentation

## 2020-09-24 DIAGNOSIS — R928 Other abnormal and inconclusive findings on diagnostic imaging of breast: Secondary | ICD-10-CM | POA: Diagnosis present

## 2020-09-25 ENCOUNTER — Ambulatory Visit: Payer: Medicare HMO | Admitting: Physical Therapy

## 2020-09-25 ENCOUNTER — Encounter: Payer: Self-pay | Admitting: Physical Therapy

## 2020-09-25 DIAGNOSIS — M542 Cervicalgia: Secondary | ICD-10-CM | POA: Diagnosis not present

## 2020-09-25 DIAGNOSIS — M62838 Other muscle spasm: Secondary | ICD-10-CM

## 2020-09-25 DIAGNOSIS — M5412 Radiculopathy, cervical region: Secondary | ICD-10-CM

## 2020-09-25 NOTE — Therapy (Signed)
[facility] [address], [place], [address] Phone: [phone]   Fax:  [phone]  Physical Therapy Treatment  Patient Details  Name: Cheryl Schroeder MRN: [id number] Date of Birth: December 22, 1955 Referring Provider (PT): Havery Moros, NP    Encounter Date: 09/25/2020   PT End of Session - 09/25/20 0907    Visit Number 7    Number of Visits 24    Date for PT Re-Evaluation 11/13/20    Authorization Type [employer]IC[employer] reporting period from 08/21/2020    Authorization Time Period [employer] auth 24 PT 08/28/20 - 11/13/20 Authorization #[id number]    Authorization - Visit Number 6    Authorization - Number of Visits 24    Progress Note Due on Visit 10    PT Start Time 0900    PT Stop Time 0940    PT Time Calculation (min) 40 min    Activity Tolerance Patient limited by pain    Behavior During Therapy [facility] for tasks assessed/performed           Past Medical History:  Diagnosis Date  . Bipolar 2 disorder ([place])   . Fibromyalgia   . Psoriasis     Past Surgical History:  Procedure Laterality Date  . ABDOMINAL HYSTERECTOMY    . ADENOIDECTOMY    . BREAST BIOPSY Right 1990   cyst  . TONSILLECTOMY      There were no vitals filed for this visit.   Subjective Assessment - 09/25/20 0904    Subjective Patient reports that she has a lot of pain today. She states her pain is very high. She said she has been very busy and her mother fell so she has had a lot to do to take care of her mother. It is also feels worse due to the cold weather. Pain rating is 6/10 at B UT and posterior neck, R > L. Felt fine following last treatment session. She was unable to work towards her exercise goal due to all the things that happened this week.    Pertinent History Patient is a 64 y.o. female who presents to outpatient physical therapy with a referral for medical diagnosis cervicalgia, radiculitis of right cervical region. This  patient's chief complaints consist of right neck, shoulder, and head pain leading to the following functional deficits: difficulty with anything that requires repeated use of the right UE such as vacuuming, mopping, cleaning, repeated reaching, groceries, repeated use of right side, prolonged sitting, reclined sitting, walking, and lying down. Relevant past medical history and comorbidities include anxiety, major depression (currently managed with medication to patient's satisfaction by psychiatrist), fibromyalgia, ADHD, psoriasis, plantar fasciitis, chronic back pain, chronic iritis (currently being managed).  States that her psychiatrists believes she has major depression instead of bipolar 2. Patient denies hx of cancer, stroke, seizures, lung problem, major cardiac events, diabetes, unexplained weight loss, changes in bowel or bladder problems, new onset stumbling or dropping things.    Limitations Other (comment);House hold activities;Standing;Walking;Lifting;Sitting   anything that requires repeated use of the right UE such as vacuuming, mopping, cleaning, repeated reaching, groceries, repeated use of right side, prolonged sitting, reclined sitting, walking, and lying down.   Diagnostic tests C-spine radiograph report 08/07/2020: "IMPRESSION:1. Mild C5-6 and C6-7 degenerative disc disease. Diffuse cervical spine facet osteoarthrosis.2. Slight C4 on C5 anterolisthesis present in flexion reduces in neutral and extension. Slight C5 on C6 anterolisthesis remains unchanged between flexion and extension.  "  Patient Stated Goals "I would like to be able to strengthen some of the arm muscles and make the pain better"    Currently in Pain? Yes    Pain Score 7             TREATMENT:  Manual therapy:to reduce pain and tissue tension, improve range of motion, neuromodulation, in order to promote improved ability to complete functional activities. Supine and proneposition:  - STM to posterior cervical  spine musculature R >L focusing on paraspinal muscles, upper trap, and levator scapulae.    Modality: (unbilled) Dry needling performed tocervical spine musculatureto decrease pain and spasms alongpatient's neckwith patient in prone lyingutilizing (3) dry needles .60mm x87mmwith (3)sticksalongright upper trap and (2) stick along left upper trap using threading technique. Patient educated about the risks and benefits from therapy and verbally consents to treatment. Dry needling performed bySara Carin Primrose, PT, DPT who is certified in this technique.  HOME EXERCISE PROGRAM Access Code: [email or web address] URL: [email or web address] Date: 09/19/2020 Prepared by: Rosita Kea  Program Notes Goals: Aerobic exercise (walking or tape) for 30 minutes + core exercises  2x a week (average of 1-3 days between each session) . Reminders: not yet established   Exercises Supine Chin Tuck - 3 sets - 10 reps - 5 second hold hold Gentle Levator Scapulae Stretch - 3 sets - 30 seconds hold Seated Gentle Upper Trapezius Stretch - 3 sets - 30 seconds hold Supine Transversus Abdominis Bracing - Hands on Stomach - 3 x weekly - 1 sets - 20 reps Supine Dead Bug with Leg Extension - 3 x weekly - 3 sets - 10 reps    PT Education - 09/25/20 0907    Education Details Exercise purpose/form. Self management techniques.    Person(s) Educated Patient    Methods Explanation;Demonstration;Tactile cues;Verbal cues    Comprehension Verbalized understanding;Returned demonstration;Verbal cues required;Tactile cues required;Need further instruction            PT Short Term Goals - 09/17/20 1052      PT SHORT TERM GOAL #1   Title Be independent with initial home exercise program for self-management of symptoms.    Baseline Initial HEP provided at IE (08/21/2020);    Time 3    Period Weeks    Status Achieved    Target Date 09/11/20             PT Long Term Goals - 08/21/20 1947      PT  LONG TERM GOAL #1   Title Be independent with a long-term home exercise program for self-management of symptoms    Baseline Initial HEP provided at IE (08/21/2020);    Time 12    Period Weeks    Status New   TARGET DATE FOR ALL LONG TERM GOALS: 11/13/2020     PT LONG TERM GOAL #2   Title Demonstrate improved FOTO score by 10 units to demonstrate improvement in overall condition and self-reported functional ability.    Baseline 65 (08/21/2020);    Time 12    Period Weeks    Status New      PT LONG TERM GOAL #3   Title Reduce pain with functional activities to equal or less than 3/10 to allow patient to complete usual activities including ADLs, IADLs, and social engagement with less difficulty.    Baseline 8/10 (08/21/2020);    Time 12    Period Weeks    Status New      PT LONG TERM GOAL #4  Title Patient will demonstrate equal or greater than 60 degrees cervical spine rotation each way to improve ability to check blind spot while driving.    Baseline Rotation: R= 50 pain at right ide of neck., L = 39 "arthritis ache at left" (08/21/2020);    Time 12    Period Weeks    Status New      PT LONG TERM GOAL #5   Title Complete community, work and/or recreational activities without limitation due to current condition    Baseline Functional Limitations: anything that requires repeated use of the right UE such as vacuuming, mopping, cleaning, repeated reaching, groceries, repeated use of right side, prolonged sitting, reclined sitting, walking, and lying down (08/21/2020);    Time 12    Period Weeks    Status New                 Plan - 09/25/20 0955    Clinical Impression Statement Patient tolerated treatment well overall, and reported decrease in pain and tension following manual therapy and dry needling.  She had moderate twitch response in the right upper trap and strong twitch response in the left upper trap that resulted in bending one of the needles.  Patient continues to  struggle with recurrent pain and tension in the bilateral upper trap region and neck musculature that shows within session improvements with some carryover.  Overall her right upper extremity has improved.  Patient has not yet been able to work on her goals of improving overall time spent with generalized fitness due to home circumstances.Patient would benefit from continued management of limiting condition by skilled physical therapist to address remaining impairments and functional limitations to work towards stated goals and return to PLOF or maximal functional independence.    Personal Factors and Comorbidities Age;Comorbidity 3+;Past/Current Experience;Fitness;Time since onset of injury/illness/exacerbation    Comorbidities anxiety, major depression (currently managed with medication to patient's satisfaction by psychiatrist), fibromyalgia, ADHD, psoriasis, plantar fasciitis, chronic back pain, chronic iritis (currently being managed).    Examination-Activity Limitations Carry;Reach Overhead;Other;Lift;Hygiene/Grooming;Dressing;Caring for Others;Bathing;Sleep    Examination-Participation Restrictions Cleaning;Community Activity;Interpersonal Relationship;Other;Valla Leaver Work;Laundry   anything that requires repeated use of the right UE such as vacuuming, mopping, cleaning, repeated reaching, groceries, repeated use of right side, prolonged sitting, reclined sitting, walking, and lying down.   Stability/Clinical Decision Making Stable/Uncomplicated    Rehab Potential Good    PT Frequency 2x / week    PT Duration 12 weeks    PT Treatment/Interventions ADLs/Self Care Home Management;Aquatic Therapy;Cryotherapy;Moist Heat;Therapeutic activities;Therapeutic exercise;Neuromuscular re-education;Cognitive remediation;Patient/family education;Manual techniques;Passive range of motion;Dry needling;Spinal Manipulations;Joint Manipulations;Biofeedback;Electrical Stimulation    PT Next Visit Plan manual therapy, gentle  exercises    PT Home Exercise Plan Medbridge Access Code: [email or web address]    Consulted and Agree with Plan of Care Patient           Patient will benefit from skilled therapeutic intervention in order to improve the following deficits and impairments:  Decreased endurance,Hypomobility,Increased muscle spasms,Improper body mechanics,Impaired perceived functional ability,Decreased range of motion,Decreased activity tolerance,Decreased strength,Impaired flexibility,Impaired UE functional use,Pain,Postural dysfunction  Visit Diagnosis: Cervicalgia  Other muscle spasm  Radiculopathy, cervical region     Problem List Patient Active Problem List   Diagnosis Date Noted  . Plantar fasciitis 07/09/2014    Everlean Alstrom. Graylon Good, PT, DPT 09/25/20, 9:56 AM  [facility] [address], [place], [address] Phone: [phone]   Fax:  [phone]  Name: Cheryl Schroeder MRN:  [id number] Date of Birth: 1955-10-24

## 2020-09-27 ENCOUNTER — Other Ambulatory Visit: Payer: Self-pay

## 2020-09-27 ENCOUNTER — Encounter: Payer: Self-pay | Admitting: Physical Therapy

## 2020-09-27 ENCOUNTER — Ambulatory Visit: Payer: Medicare HMO | Admitting: Physical Therapy

## 2020-09-27 DIAGNOSIS — M5412 Radiculopathy, cervical region: Secondary | ICD-10-CM

## 2020-09-27 DIAGNOSIS — M542 Cervicalgia: Secondary | ICD-10-CM

## 2020-09-27 DIAGNOSIS — M62838 Other muscle spasm: Secondary | ICD-10-CM

## 2020-09-27 NOTE — Therapy (Signed)
[facility] [address], [place], [address] Phone: [phone]   Fax:  [phone]  Physical Therapy Treatment  Patient Details  Name: Cheryl Schroeder MRN: [id number] Date of Birth: 01/08/1956 Referring Provider (PT): Havery Moros, NP    Encounter Date: 09/27/2020   PT End of Session - 09/27/20 1031    Visit Number 8    Number of Visits 24    Date for PT Re-Evaluation 11/13/20    Authorization Type [employer]IC[employer] reporting period from 08/21/2020    Authorization Time Period [employer] auth 24 PT 08/28/20 - 11/13/20 Authorization #[id number]    Authorization - Visit Number 7    Authorization - Number of Visits 24    Progress Note Due on Visit 10    PT Start Time 0945    PT Stop Time 1030    PT Time Calculation (min) 45 min    Activity Tolerance Patient limited by pain    Behavior During Therapy [facility] for tasks assessed/performed           Past Medical History:  Diagnosis Date  . Bipolar 2 disorder ([place])   . Fibromyalgia   . Psoriasis     Past Surgical History:  Procedure Laterality Date  . ABDOMINAL HYSTERECTOMY    . ADENOIDECTOMY    . BREAST BIOPSY Right 1990   cyst  . TONSILLECTOMY      There were no vitals filed for this visit.   Subjective Assessment - 09/27/20 0946    Subjective Patient reports she had a huge headache yesterday and she is getting a lot of pain still in the B UTs R>L. she felt the manual therapy and needling really helped her and would like to do it again today. Reports pain is 5/10 mostly in the right upper trap.    Pertinent History Patient is a 64 y.o. female who presents to outpatient physical therapy with a referral for medical diagnosis cervicalgia, radiculitis of right cervical region. This patient's chief complaints consist of right neck, shoulder, and head pain leading to the following functional deficits: difficulty with anything that requires repeated use of  the right UE such as vacuuming, mopping, cleaning, repeated reaching, groceries, repeated use of right side, prolonged sitting, reclined sitting, walking, and lying down. Relevant past medical history and comorbidities include anxiety, major depression (currently managed with medication to patient's satisfaction by psychiatrist), fibromyalgia, ADHD, psoriasis, plantar fasciitis, chronic back pain, chronic iritis (currently being managed).  States that her psychiatrists believes she has major depression instead of bipolar 2. Patient denies hx of cancer, stroke, seizures, lung problem, major cardiac events, diabetes, unexplained weight loss, changes in bowel or bladder problems, new onset stumbling or dropping things.    Limitations Other (comment);House hold activities;Standing;Walking;Lifting;Sitting   anything that requires repeated use of the right UE such as vacuuming, mopping, cleaning, repeated reaching, groceries, repeated use of right side, prolonged sitting, reclined sitting, walking, and lying down.   Diagnostic tests C-spine radiograph report 08/07/2020: "IMPRESSION:1. Mild C5-6 and C6-7 degenerative disc disease. Diffuse cervical spine facet osteoarthrosis.2. Slight C4 on C5 anterolisthesis present in flexion reduces in neutral and extension. Slight C5 on C6 anterolisthesis remains unchanged between flexion and extension.  "    Patient Stated Goals "I would like to be able to strengthen some of the arm muscles and make the pain better"    Currently in Pain? Yes    Pain Score 5  TREATMENT:  Manual therapy:to reduce pain and tissue tension, improve range of motion, neuromodulation, in order to promote improved ability to complete functional activities. Proneposition:  - STM to posterior cervical spine musculature R >L focusing on paraspinal muscles, upper trap, and levator scapulae. - CPA to cervical spine and upper thoracic spine grade II-III for pain control and improved  motion.   Modality: (unbilled) Dry needling performed tocervical spine musculatureto decrease pain and spasms alongpatient's neckwith patient in prone lyingutilizing (1) dry needles .46mm x7mmwith (2)sticksalongright upper trapand (1) stick along left upper trapusing threading technique and (1) .81mm x 5mm at R cervical multifidi near C6. Patient educated about the risks and benefits from therapy and verbally consents to treatment. Dry needling performed bySara Carin Primrose, PT, DPT who is certified in this technique.  Therapeutic exercise: to centralize symptoms and improve ROM, strength, muscular endurance, and activity tolerance required for successful completion of functional activities.  - prone scapular retraction with hand lifts, 3x10 - supine chin tuck, 3x10  HOME EXERCISE PROGRAM Access Code: [email or web address] URL: [email or web address] Date: 09/19/2020 Prepared by: Rosita Kea  Program Notes Goals: Aerobic exercise (walking or tape) for 30 minutes + core exercises  2x a week (average of 1-3 days between each session) . Reminders: not yet established   Exercises Supine Chin Tuck - 3 sets - 10 reps - 5 second hold hold Gentle Levator Scapulae Stretch - 3 sets - 30 seconds hold Seated Gentle Upper Trapezius Stretch - 3 sets - 30 seconds hold Supine Transversus Abdominis Bracing - Hands on Stomach - 3 x weekly - 1 sets - 20 reps Supine Dead Bug with Leg Extension - 3 x weekly - 3 sets - 10 reps    PT Education - 09/27/20 1031    Education Details Exercise purpose/form. Self management techniques.    Person(s) Educated Patient    Methods Explanation;Demonstration;Tactile cues;Verbal cues    Comprehension Verbalized understanding;Returned demonstration;Verbal cues required;Tactile cues required;Need further instruction            PT Short Term Goals - 09/17/20 1052      PT SHORT TERM GOAL #1   Title Be independent with initial home exercise  program for self-management of symptoms.    Baseline Initial HEP provided at IE (08/21/2020);    Time 3    Period Weeks    Status Achieved    Target Date 09/11/20             PT Long Term Goals - 08/21/20 1947      PT LONG TERM GOAL #1   Title Be independent with a long-term home exercise program for self-management of symptoms    Baseline Initial HEP provided at IE (08/21/2020);    Time 12    Period Weeks    Status New   TARGET DATE FOR ALL LONG TERM GOALS: 11/13/2020     PT LONG TERM GOAL #2   Title Demonstrate improved FOTO score by 10 units to demonstrate improvement in overall condition and self-reported functional ability.    Baseline 65 (08/21/2020);    Time 12    Period Weeks    Status New      PT LONG TERM GOAL #3   Title Reduce pain with functional activities to equal or less than 3/10 to allow patient to complete usual activities including ADLs, IADLs, and social engagement with less difficulty.    Baseline 8/10 (08/21/2020);    Time 12    Period Weeks    Status  New      PT LONG TERM GOAL #4   Title Patient will demonstrate equal or greater than 60 degrees cervical spine rotation each way to improve ability to check blind spot while driving.    Baseline Rotation: R= 50 pain at right ide of neck., L = 39 "arthritis ache at left" (08/21/2020);    Time 12    Period Weeks    Status New      PT LONG TERM GOAL #5   Title Complete community, work and/or recreational activities without limitation due to current condition    Baseline Functional Limitations: anything that requires repeated use of the right UE such as vacuuming, mopping, cleaning, repeated reaching, groceries, repeated use of right side, prolonged sitting, reclined sitting, walking, and lying down (08/21/2020);    Time 12    Period Weeks    Status New                 Plan - 09/27/20 1030    Clinical Impression Statement Patient tolerated treatment well overall and reported decreased pain by  end of session. She continues to struggle with pain at the upper traps and neck that leads to headaches and difficulty using R UE and tolerating usual activities. It does appear to worsen with stress or increased physical activity load. Patient would benefit from continued management of limiting condition by skilled physical therapist to address remaining impairments and functional limitations to work towards stated goals and return to PLOF or maximal functional independence.    Personal Factors and Comorbidities Age;Comorbidity 3+;Past/Current Experience;Fitness;Time since onset of injury/illness/exacerbation    Comorbidities anxiety, major depression (currently managed with medication to patient's satisfaction by psychiatrist), fibromyalgia, ADHD, psoriasis, plantar fasciitis, chronic back pain, chronic iritis (currently being managed).    Examination-Activity Limitations Carry;Reach Overhead;Other;Lift;Hygiene/Grooming;Dressing;Caring for Others;Bathing;Sleep    Examination-Participation Restrictions Cleaning;Community Activity;Interpersonal Relationship;Other;Valla Leaver Work;Laundry   anything that requires repeated use of the right UE such as vacuuming, mopping, cleaning, repeated reaching, groceries, repeated use of right side, prolonged sitting, reclined sitting, walking, and lying down.   Stability/Clinical Decision Making Stable/Uncomplicated    Rehab Potential Good    PT Frequency 2x / week    PT Duration 12 weeks    PT Treatment/Interventions ADLs/Self Care Home Management;Aquatic Therapy;Cryotherapy;Moist Heat;Therapeutic activities;Therapeutic exercise;Neuromuscular re-education;Cognitive remediation;Patient/family education;Manual techniques;Passive range of motion;Dry needling;Spinal Manipulations;Joint Manipulations;Biofeedback;Electrical Stimulation    PT Next Visit Plan manual therapy, gentle exercises    PT Home Exercise Plan Medbridge Access Code: [email or web address]    Consulted and Agree with Plan  of Care Patient           Patient will benefit from skilled therapeutic intervention in order to improve the following deficits and impairments:  Decreased endurance,Hypomobility,Increased muscle spasms,Improper body mechanics,Impaired perceived functional ability,Decreased range of motion,Decreased activity tolerance,Decreased strength,Impaired flexibility,Impaired UE functional use,Pain,Postural dysfunction  Visit Diagnosis: Cervicalgia  Other muscle spasm  Radiculopathy, cervical region     Problem List Patient Active Problem List   Diagnosis Date Noted  . Plantar fasciitis 07/09/2014    Everlean Alstrom. Graylon Good, PT, DPT 09/27/20, 10:48 AM  [facility] [address] [place], [address] Phone: [phone]   Fax:  [phone]  Name: Cheryl Schroeder MRN: [id number] Date of Birth: 12/23/1955

## 2020-10-02 ENCOUNTER — Encounter: Payer: Medicare HMO | Admitting: Physical Therapy

## 2020-10-04 ENCOUNTER — Encounter: Payer: Medicare HMO | Admitting: Physical Therapy

## 2020-10-15 ENCOUNTER — Encounter: Payer: Medicare HMO | Admitting: Physical Therapy

## 2020-10-17 ENCOUNTER — Ambulatory Visit: Payer: Medicare HMO | Attending: Orthopedic | Admitting: Physical Therapy

## 2020-10-17 ENCOUNTER — Other Ambulatory Visit: Payer: Self-pay

## 2020-10-17 ENCOUNTER — Encounter: Payer: Self-pay | Admitting: Physical Therapy

## 2020-10-17 DIAGNOSIS — M542 Cervicalgia: Secondary | ICD-10-CM | POA: Diagnosis present

## 2020-10-17 DIAGNOSIS — M5412 Radiculopathy, cervical region: Secondary | ICD-10-CM | POA: Insufficient documentation

## 2020-10-17 DIAGNOSIS — M62838 Other muscle spasm: Secondary | ICD-10-CM | POA: Insufficient documentation

## 2020-10-17 NOTE — Therapy (Signed)
[facility] [address], [place], [address] Phone: [phone]   Fax:  [phone]  Physical Therapy Treatment / Discharge Summary Reporting period: 08/21/2020 - 10/17/2020  Patient Details  Name: Cheryl Schroeder MRN: [id number] Date of Birth: [birth year] Referring Provider (PT): Havery Moros, NP    Encounter Date: 10/17/2020   PT End of Session - 10/17/20 1546    Visit Number 9    Number of Visits 24    Date for PT Re-Evaluation 11/13/20    Authorization Type [employer]IC[employer] reporting period from 08/21/2020    Authorization Time Period [employer] auth 24 PT 08/28/20 - 11/13/20 Authorization #[id number]    Authorization - Visit Number 8    Authorization - Number of Visits 24    Progress Note Due on Visit 10    PT Start Time 4081    PT Stop Time 1115    PT Time Calculation (min) 40 min    Activity Tolerance Patient limited by pain;Patient tolerated treatment well    Behavior During Therapy [facility] for tasks assessed/performed           Past Medical History:  Diagnosis Date  . Bipolar 2 disorder ([place])   . Fibromyalgia   . Psoriasis     Past Surgical History:  Procedure Laterality Date  . ABDOMINAL HYSTERECTOMY    . ADENOIDECTOMY    . BREAST BIOPSY Right 1990   cyst  . TONSILLECTOMY      There were no vitals filed for this visit.   Subjective Assessment - 10/17/20 1040    Subjective Patient reports she has 5/10 pain in her right upper trap. Her arm is better since starting PT. She would like to do dry needling today but feels it is temporary and she would like for today to be her last session so she can continue to work on [employer]. She feels the exercises help but she needs to be more consistent with them. She walked three times last week. Sometimes the pain goes down the arm occasionally .    Pertinent History Patient is a 65 y.o. female who presents to outpatient physical therapy with a referral for  medical diagnosis cervicalgia, radiculitis of right cervical region. This patient's chief complaints consist of right neck, shoulder, and head pain leading to the following functional deficits: difficulty with anything that requires repeated use of the right UE such as vacuuming, mopping, cleaning, repeated reaching, groceries, repeated use of right side, prolonged sitting, reclined sitting, walking, and lying down. Relevant past medical history and comorbidities include anxiety, major depression (currently managed with medication to patient's satisfaction by psychiatrist), fibromyalgia, ADHD, psoriasis, plantar fasciitis, chronic back pain, chronic iritis (currently being managed).  States that her psychiatrists believes she has major depression instead of bipolar 2. Patient denies hx of cancer, stroke, seizures, lung problem, major cardiac events, diabetes, unexplained weight loss, changes in bowel or bladder problems, new onset stumbling or dropping things.    Limitations Other (comment);House hold activities;Standing;Walking;Lifting;Sitting   anything that requires repeated use of the right UE such as vacuuming, mopping, cleaning, repeated reaching, groceries, repeated use of right side, prolonged sitting, reclined sitting, walking, and lying down.   Diagnostic tests C-spine radiograph report 08/07/2020: "IMPRESSION:1. Mild C5-6 and C6-7 degenerative disc disease. Diffuse cervical spine facet osteoarthrosis.2. Slight C4 on C5 anterolisthesis present in flexion reduces in neutral and extension. Slight C5 on C6 anterolisthesis remains unchanged between flexion and extension.  "  Patient Stated Goals "I would like to be able to strengthen some of the arm muscles and make the pain better"    Currently in Pain? Yes    Pain Score 5     Pain Location Neck    Pain Orientation Right          OBJECTIVE FOTO = 51   PALPATION Very tight and tender to right upper trap region.    TREATMENT:  Manual  therapy:to reduce pain and tissue tension, improve range of motion, neuromodulation, in order to promote improved ability to complete functional activities. Proneposition:  - STM to R posterior cervical spine musculature R focusing on upper trap, and levator scapulae. - CPA to cervical spine and upper thoracic spine grade II-III for pain control and improved motion.   Modality: (unbilled) Dry needling performed tocervical spine musculatureto decrease pain and spasms alongpatient's neckwith patient in prone lyingutilizing (2) dry needles.29m x476mith (3)sticksalongright upper trap. 3rd stick used to help decrease muscle spasm on 2nd stick that was holding first needle in the muscle. Patient educated about the risks and benefits from therapy and verbally consents to treatment.Strong spasm in muscle with 1st and 2nd sticks that required waiting for longer periods for contraction to stop in order to remove needle.  Dry needling performed bySara R Carin PrimrosePT, DPT who is certified in this technique.  HOME EXERCISE PROGRAM Access Code: [email or web address] URL: [email or web address] Date: 09/19/2020 Prepared by: SaRosita KeaProgram Notes Goals: Aerobic exercise (walking or tape) for 30 minutes + core exercises  2x a week (average of 1-3 days between each session) . Reminders: not yet established   Exercises Supine Chin Tuck - 3 sets - 10 reps - 5 second hold hold Gentle Levator Scapulae Stretch - 3 sets - 30 seconds hold Seated Gentle Upper Trapezius Stretch - 3 sets - 30 seconds hold Supine Transversus Abdominis Bracing - Hands on Stomach - 3 x weekly - 1 sets - 20 reps Supine Dead Bug with Leg Extension - 3 x weekly - 3 sets - 10 reps    PT Education - 10/17/20 1545    Education Details intervention purpose. discharge instructions    Person(s) Educated Patient    Methods Explanation;Demonstration    Comprehension Verbalized understanding            PT  Short Term Goals - 09/17/20 1052      PT SHORT TERM GOAL #1   Title Be independent with initial home exercise program for self-management of symptoms.    Baseline Initial HEP provided at IE (08/21/2020);    Time 3    Period Weeks    Status Achieved    Target Date 09/11/20             PT Long Term Goals - 10/17/20 1551      PT LONG TERM GOAL #1   Title Be independent with a long-term home exercise program for self-management of symptoms    Baseline Initial HEP provided at IE (08/21/2020); patient reports she feels better when she participates but is not consistent (10/18/2019);    Time 12    Period Weeks    Status Partially Met   TARGET DATE FOR ALL LONG TERM GOALS: 11/13/2020     PT LONG TERM GOAL #2   Title Demonstrate improved FOTO score by 10 units to demonstrate improvement in overall condition and self-reported functional ability.    Baseline 65 (08/21/2020); 51 (10/17/2020);    Time 12    Period  Weeks    Status Not Met      PT LONG TERM GOAL #3   Title Reduce pain with functional activities to equal or less than 3/10 to allow patient to complete usual activities including ADLs, IADLs, and social engagement with less difficulty.    Baseline 8/10 (08/21/2020); reports continues to get just as high (10/17/2020);    Time 12    Period Weeks    Status Not Met      PT LONG TERM GOAL #4   Title Patient will demonstrate equal or greater than 60 degrees cervical spine rotation each way to improve ability to check blind spot while driving.    Baseline Rotation: R= 50 pain at right ide of neck., L = 39 "arthritis ache at left" (08/21/2020); not measured (10/17/2020);    Time 12    Period Weeks    Status Unable to assess      PT LONG TERM GOAL #5   Title Complete community, work and/or recreational activities without limitation due to current condition    Baseline Functional Limitations: anything that requires repeated use of the right UE such as vacuuming, mopping, cleaning,  repeated reaching, groceries, repeated use of right side, prolonged sitting, reclined sitting, walking, and lying down (08/21/2020); reports continued difficulties but is able to use R UE a bit more (10/17/2020);    Time 12    Period Weeks    Status Partially Met                 Plan - 10/17/20 1555    Clinical Impression Statement Patient has attended 9 physical therapy treatment sessions and request today be her last day. She reports she feels better when she does her exercises and feels the manual therapy and dry needling really help her but states her pain continues to come back and she "cannot do PT forever." Does seem to complain of less R shoulder/arm pain but did not meet most of her goals. Patient reports she feels confident with her HEP and just needs to perform it more often. Patient is now discharged from PT per her request.    Personal Factors and Comorbidities Age;Comorbidity 3+;Past/Current Experience;Fitness;Time since onset of injury/illness/exacerbation    Comorbidities anxiety, major depression (currently managed with medication to patient's satisfaction by psychiatrist), fibromyalgia, ADHD, psoriasis, plantar fasciitis, chronic back pain, chronic iritis (currently being managed).    Examination-Activity Limitations Carry;Reach Overhead;Other;Lift;Hygiene/Grooming;Dressing;Caring for Others;Bathing;Sleep    Examination-Participation Restrictions Cleaning;Community Activity;Interpersonal Relationship;Other;Valla Leaver Work;Laundry   anything that requires repeated use of the right UE such as vacuuming, mopping, cleaning, repeated reaching, groceries, repeated use of right side, prolonged sitting, reclined sitting, walking, and lying down.   Stability/Clinical Decision Making Stable/Uncomplicated    Rehab Potential Good    PT Frequency 2x / week    PT Duration 12 weeks    PT Treatment/Interventions ADLs/Self Care Home Management;Aquatic Therapy;Cryotherapy;Moist Heat;Therapeutic  activities;Therapeutic exercise;Neuromuscular re-education;Cognitive remediation;Patient/family education;Manual techniques;Passive range of motion;Dry needling;Spinal Manipulations;Joint Manipulations;Biofeedback;Electrical Stimulation    PT Next Visit Plan Patient is now discharged from [place] Access Code: [email or web address]    Consulted and Agree with Plan of Care Patient           Patient will benefit from skilled therapeutic intervention in order to improve the following deficits and impairments:  Decreased endurance,Hypomobility,Increased muscle spasms,Improper body mechanics,Impaired perceived functional ability,Decreased range of motion,Decreased activity tolerance,Decreased strength,Impaired flexibility,Impaired UE functional use,Pain,Postural dysfunction  Visit Diagnosis: Cervicalgia  Other muscle spasm  Radiculopathy, cervical region     Problem List Patient Active Problem List   Diagnosis Date Noted  . Plantar fasciitis 07/09/2014    Everlean Alstrom. Graylon Good, PT, DPT 10/17/20, 3:56 PM  [facility] [address], [place], [address] Phone: [phone]   Fax:  [phone]  Name: BRANDILYNN TAORMINA MRN: [id number] Date of Birth: [birth year]

## 2020-10-22 ENCOUNTER — Encounter: Payer: Medicare HMO | Admitting: Physical Therapy

## 2020-10-24 ENCOUNTER — Encounter: Payer: Medicare HMO | Admitting: Physical Therapy

## 2020-10-29 ENCOUNTER — Encounter: Payer: Medicare HMO | Admitting: Physical Therapy

## 2020-10-31 ENCOUNTER — Encounter: Payer: Medicare HMO | Admitting: Physical Therapy

## 2020-11-05 ENCOUNTER — Ambulatory Visit: Payer: Medicare HMO | Admitting: Dermatology

## 2020-12-17 ENCOUNTER — Ambulatory Visit: Payer: Medicare HMO | Admitting: Dermatology

## 2021-03-12 ENCOUNTER — Ambulatory Visit
Admission: RE | Admit: 2021-03-12 | Discharge: 2021-03-12 | Disposition: A | Discharge status: Home / Self Care | Payer: Medicare HMO | Source: Ambulatory Visit | Attending: Family Medicine | Admitting: Family Medicine

## 2021-03-12 ENCOUNTER — Ambulatory Visit
Admission: RE | Admit: 2021-03-12 | Discharge: 2021-03-12 | Disposition: A | Discharge status: Home / Self Care | Payer: Medicare HMO | Attending: Family Medicine | Admitting: Family Medicine

## 2021-03-12 ENCOUNTER — Other Ambulatory Visit: Payer: Self-pay

## 2021-03-12 ENCOUNTER — Other Ambulatory Visit: Payer: Self-pay | Admitting: Family Medicine

## 2021-03-12 DIAGNOSIS — M1711 Unilateral primary osteoarthritis, right knee: Secondary | ICD-10-CM

## 2021-04-24 ENCOUNTER — Ambulatory Visit (INDEPENDENT_AMBULATORY_CARE_PROVIDER_SITE_OTHER): Payer: Medicare HMO | Admitting: Dermatology

## 2021-04-24 ENCOUNTER — Other Ambulatory Visit: Payer: Self-pay

## 2021-04-24 ENCOUNTER — Encounter: Payer: Self-pay | Admitting: Dermatology

## 2021-04-24 DIAGNOSIS — L814 Other melanin hyperpigmentation: Secondary | ICD-10-CM

## 2021-04-24 DIAGNOSIS — L578 Other skin changes due to chronic exposure to nonionizing radiation: Secondary | ICD-10-CM

## 2021-04-24 DIAGNOSIS — L409 Psoriasis, unspecified: Secondary | ICD-10-CM | POA: Diagnosis not present

## 2021-04-24 DIAGNOSIS — D229 Melanocytic nevi, unspecified: Secondary | ICD-10-CM

## 2021-04-24 DIAGNOSIS — L650 Telogen effluvium: Secondary | ICD-10-CM | POA: Diagnosis not present

## 2021-04-24 DIAGNOSIS — Z1283 Encounter for screening for malignant neoplasm of skin: Secondary | ICD-10-CM

## 2021-04-24 DIAGNOSIS — D1800 Hemangioma unspecified site: Secondary | ICD-10-CM

## 2021-04-24 DIAGNOSIS — L821 Other seborrheic keratosis: Secondary | ICD-10-CM

## 2021-04-24 MED ORDER — HYDROCORTISONE 2.5 % EX LOTN: TOPICAL_LOTION | CUTANEOUS | 4 refills | Status: DC

## 2021-04-24 MED ORDER — KETOCONAZOLE 2 % EX CREA: 1.0000 "application " | TOPICAL_CREAM | CUTANEOUS | 3 refills | Status: AC

## 2021-04-24 NOTE — Progress Notes (Signed)
New Patient Visit  Subjective  Cheryl Schroeder is a 65 y.o. female who presents for the following: Total body skin exam (Hx of Dysplastic R calf txted yrs ago), Psoriasis (Ears, using TMC 0.1% cr), and Alopecia (Frontal scalp, 5-10 yrs,). The patient presents for Total-Body Skin Exam (TBSE) for skin cancer screening and mole check.  Patient accompanied by mother.  The following portions of the chart were reviewed this encounter and updated as appropriate:   Tobacco  Allergies  Meds  Problems  Med Hx  Surg Hx  Fam Hx     Review of Systems:  No other skin or systemic complaints except as noted in HPI or Assessment and Plan.  Objective  Well appearing patient in no apparent distress; mood and affect are within normal limits.  A full examination was performed including scalp, head, eyes, ears, nose, lips, neck, chest, axillae, abdomen, back, buttocks, bilateral upper extremities, bilateral lower extremities, hands, feet, fingers, toes, fingernails, and toenails. All findings within normal limits unless otherwise noted below.  bil ears Pinkness with peeling and scale  Scalp Mild thinning   Assessment & Plan   Lentigines - Scattered tan macules - Due to sun exposure - Benign-appering, observe - Recommend daily broad spectrum sunscreen SPF 30+ to sun-exposed areas, reapply every 2 hours as needed. - Call for any changes  Seborrheic Keratoses - Stuck-on, waxy, tan-brown papules and/or plaques  - Benign-appearing - Discussed benign etiology and prognosis. - Observe - Call for any changes  Melanocytic Nevi - Tan-brown and/or pink-flesh-colored symmetric macules and papules - Benign appearing on exam today - Observation - Call clinic for new or changing moles - Recommend daily use of broad spectrum spf 30+ sunscreen to sun-exposed areas.   Hemangiomas - Red papules - Discussed benign nature - Observe - Call for any changes - Discussed BBL $350 per txt  session  Actinic Damage - Chronic condition, secondary to cumulative UV/sun exposure - diffuse scaly erythematous macules with underlying dyspigmentation - Recommend daily broad spectrum sunscreen SPF 30+ to sun-exposed areas, reapply every 2 hours as needed.  - Staying in the shade or wearing long sleeves, sun glasses (UVA+UVB protection) and wide brim hats (4-inch brim around the entire circumference of the hat) are also recommended for sun protection.  - Call for new or changing lesions.  Skin cancer screening performed today.  Psoriasis /SEBO psoriasis bil ears Psoriasis is a chronic non-curable, but treatable genetic/hereditary disease that may have other systemic features affecting other organ systems such as joints (Psoriatic Arthritis). It is associated with an increased risk of inflammatory bowel disease, heart disease, non-alcoholic fatty liver disease, and depression.    Start HC 2.5% lotion hs on Monday, Wednesday and Fridays  Start Ketoconazole 2% cr hs on Tuesday, Thursday and Saturday  ketoconazole (NIZORAL) 2 % cream - bil ears Apply 1 application topically 3 (three) times a week. Apply to ears at night on Mondays, Wednesdays and Fridays for psoriasis  hydrocortisone 2.5 % lotion - bil ears Apply topically as directed. Apply to ears 3 nights per week Tuesday, Thursday, [place], for psoriasis prn flares  Telogen effluvium Scalp Telogen effluvium is a benign, self limited condition causing increased hair shedding usually for several months. It does not progress to baldness. It can be triggered by recent illness, recent surgery, thyroid disease, low iron stores, vitamin D deficiency, fad diets or rapid weight loss, hormonal changes such as pregnancy or birth control pills, and some medication. Usually the hair loss  starts 2-3 months after the illness or health change. Rarely, it can continue for longer than a year.  Recommend Biotin 2.5mg  qd.   Discussed red light  treatments with Revi an; and topical Rogaine and oral options.  Related Procedures TSH  Return in about 3 months (around 07/25/2021) for psoriasis and telogen effluvium.  I, Othelia Pulling, RMA, am acting as scribe for Sarina Ser, MD . Documentation: I have reviewed the above documentation for accuracy and completeness, and I agree with the above.  Sarina Ser, MD

## 2021-04-24 NOTE — Patient Instructions (Addendum)
If you have any questions or concerns for your doctor, please call our main line at [phone] and press option 4 to reach your doctor's medical assistant. If no one answers, please leave a voicemail as directed and we will return your call as soon as possible. Messages left after 4 pm will be answered the following business day.   You may also send [place] a message via [place]. We typically respond to MyChart messages within 1-2 business days.  For prescription refills, please ask your pharmacy to contact our office. Our fax number is [phone].  If you have an urgent issue when the clinic is closed that cannot wait until the next business day, you can page your doctor at the number below.    Please note that while we do our best to be available for urgent issues outside of office hours, we are not available 24/7.   If you have an urgent issue and are unable to reach [place], you may choose to seek medical care at your doctor's office, retail clinic, urgent care center, or emergency room.  If you have a medical emergency, please immediately call 911 or go to the emergency department.  Pager Numbers  - Dr. Nehemiah Massed: [phone]  - Dr. Laurence Ferrari: [phone]  - Dr. Nicole Kindred: [phone]  In the event of inclement weather, please call our main line at [phone] for an update on the status of any delays or closures.  Dermatology Medication Tips: Please keep the boxes that topical medications come in in order to help keep track of the instructions about where and how to use these. Pharmacies typically print the medication instructions only on the boxes and not directly on the medication tubes.   If your medication is too expensive, please contact our office at [phone] option 4 or send [place] a message through [place].   We are unable to tell what your co-pay for medications will be in advance as this is different depending on your insurance coverage. However, we may be able to find a substitute  medication at lower cost or fill out paperwork to get insurance to cover a needed medication.   If a prior authorization is required to get your medication covered by your insurance company, please allow [place] 1-2 business days to complete this process.  Drug prices often vary depending on where the prescription is filled and some pharmacies may offer cheaper prices.  The website www.goodrx.com contains coupons for medications through different pharmacies. The prices here do not account for what the cost may be with help from insurance (it may be cheaper with your insurance), but the website can give you the price if you did not use any insurance.  - You can print the associated coupon and take it with your prescription to the pharmacy.  - You may also stop by our office during regular business hours and pick up a GoodRx coupon card.  - If you need your prescription sent electronically to a different pharmacy, notify our office through [facility] or by phone at [phone] option 4.  Start Biotin 2.5mg  daily

## 2021-04-25 ENCOUNTER — Encounter: Payer: Self-pay | Admitting: Dermatology

## 2021-04-27 LAB — TSH: TSH: 1.48 u[IU]/mL (ref 0.450–4.500)

## 2021-04-30 ENCOUNTER — Telehealth: Payer: Self-pay

## 2021-04-30 NOTE — Telephone Encounter (Signed)
Discussed lab results with pt

## 2021-04-30 NOTE — Telephone Encounter (Signed)
-----   Message from Ralene Bathe, MD sent at 04/27/2021 10:52 AM EDT ----- TSH / Thyroid test from 04/26/21 is  NORMAL  Hair loss does not appear to be related to thyroid. Continue recommendations and  Keep follow up appt

## 2021-07-23 IMAGING — MG DIGITAL SCREENING BILAT W/ TOMO W/ CAD
8 series · 9 of 24 positions shown · non-contrast
Comparison: Previous exam(s).

CLINICAL DATA: Screening.

EXAM:
DIGITAL SCREENING BILATERAL MAMMOGRAM WITH TOMO AND CAD

[R MLO synth-2D]
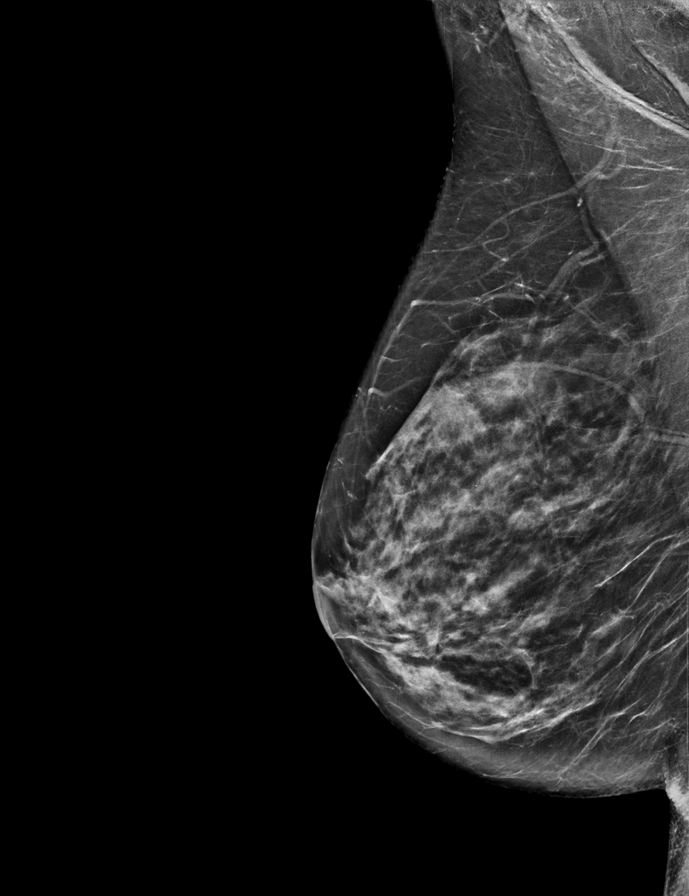

[R CC synth-2D]
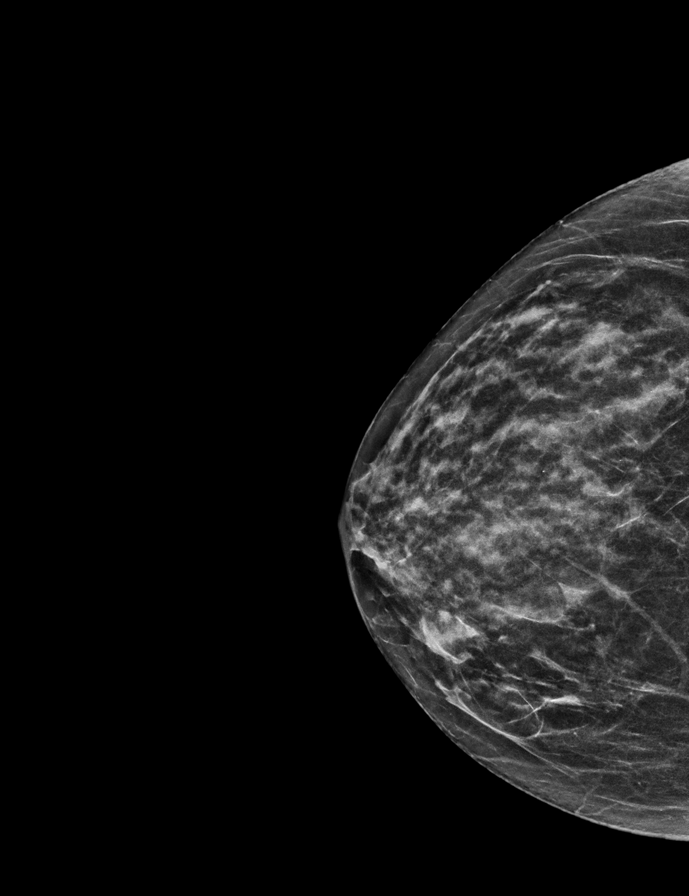

[L MLO synth-2D]
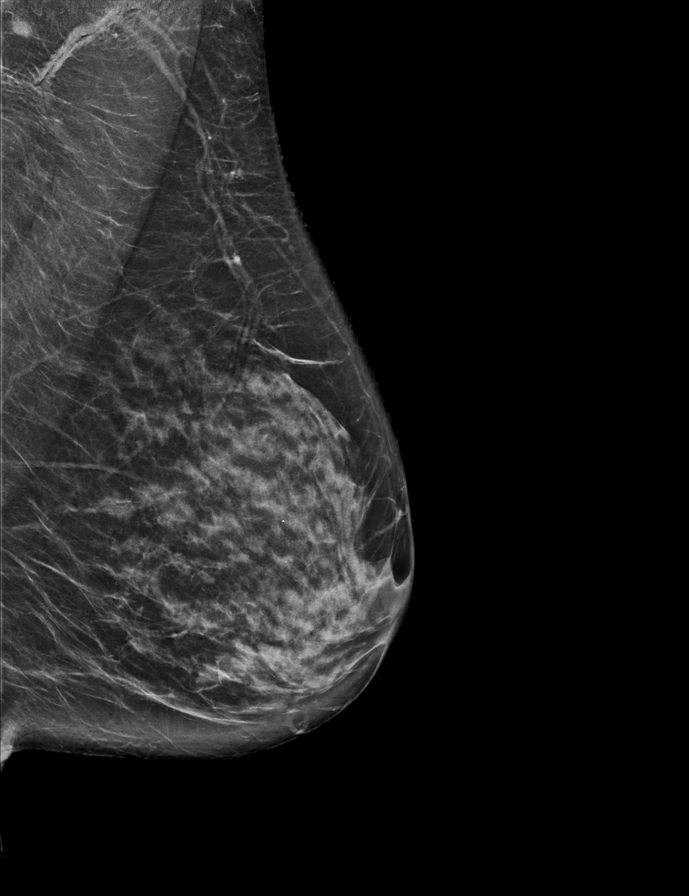

[L CC synth-2D]
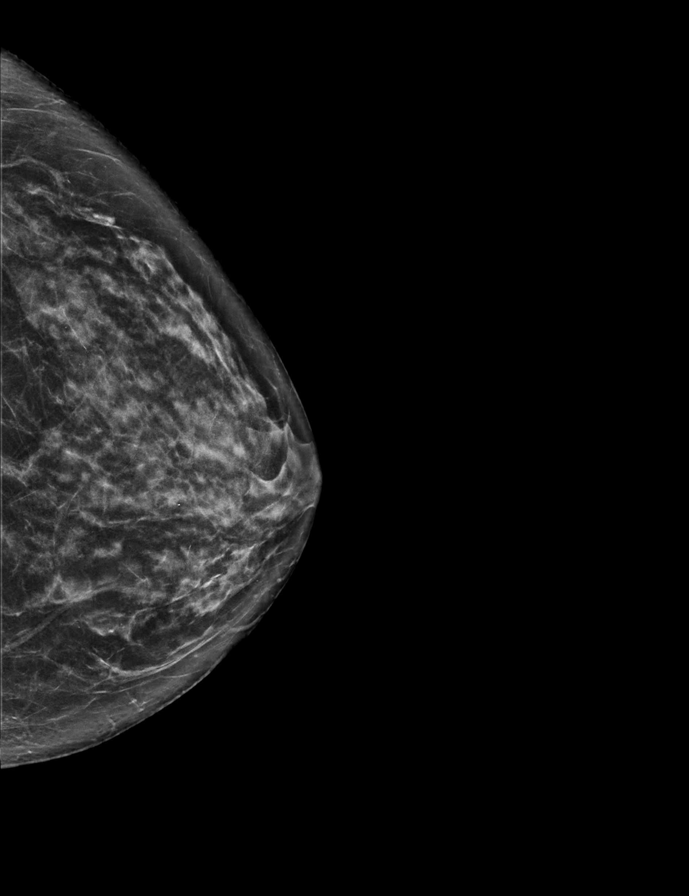

[R CC tomo · 2 of 45 frames shown]
[frame 15/45]
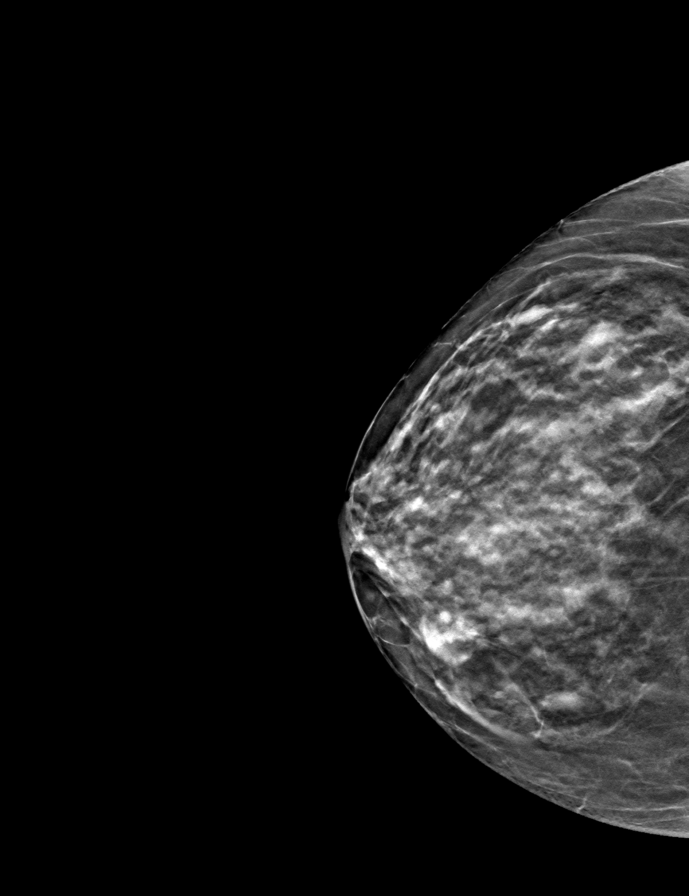
[frame 23/45]
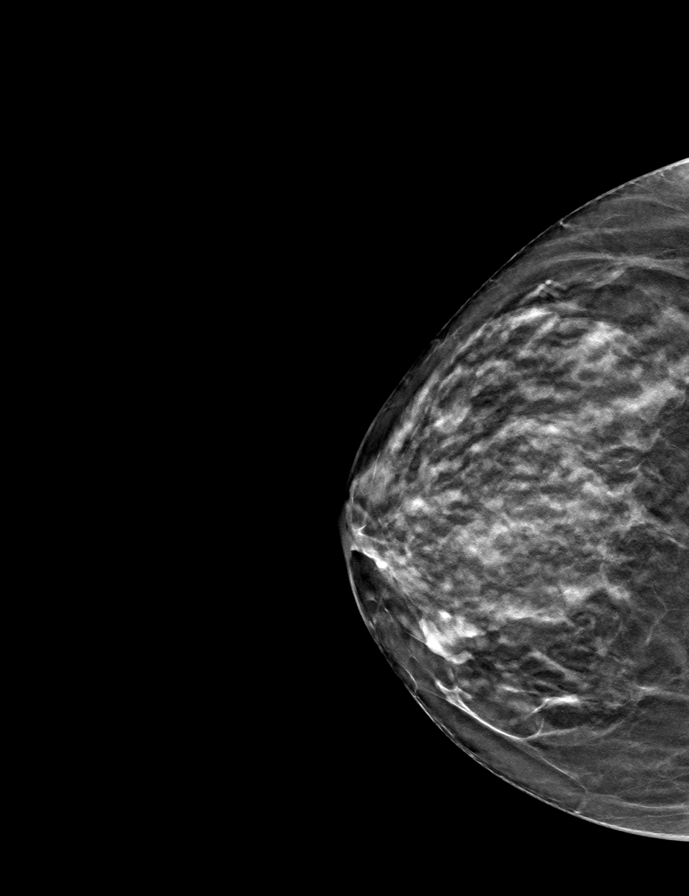

[L CC tomo · tomo slice 29/58.0]
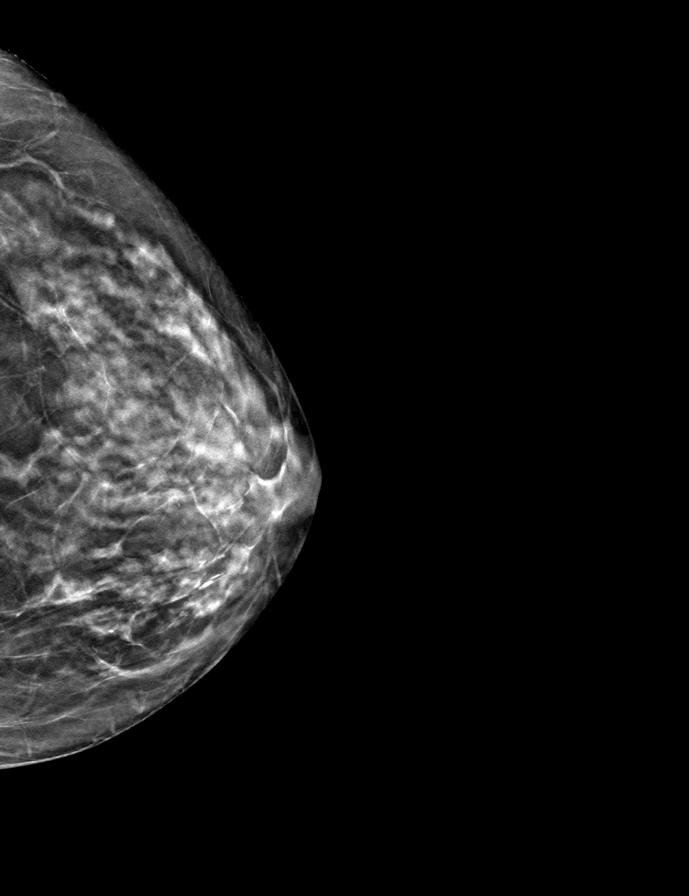

[R MLO tomo · tomo slice 31/62.0]
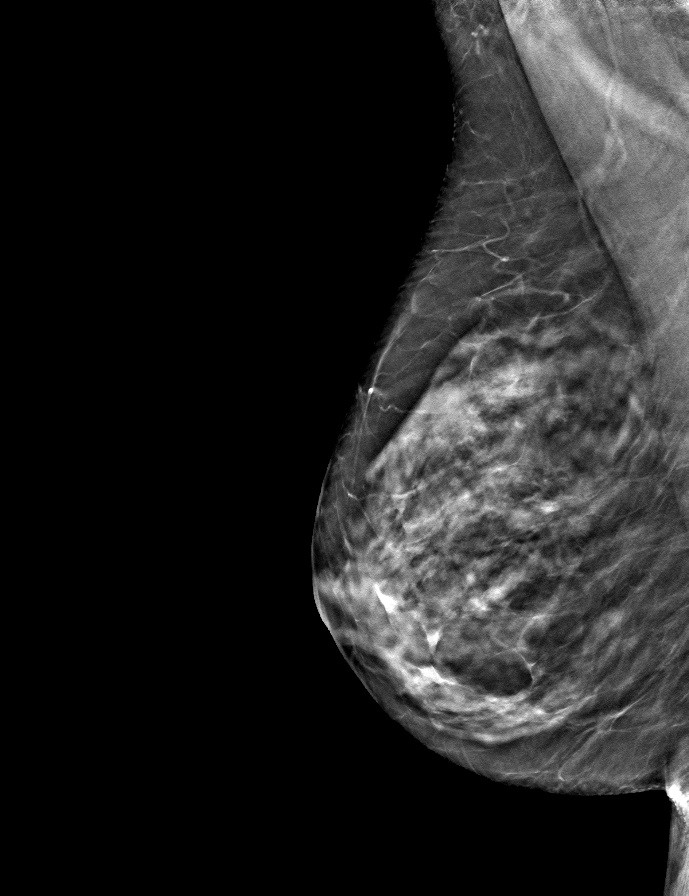

[L MLO tomo · tomo slice 32/63.0]
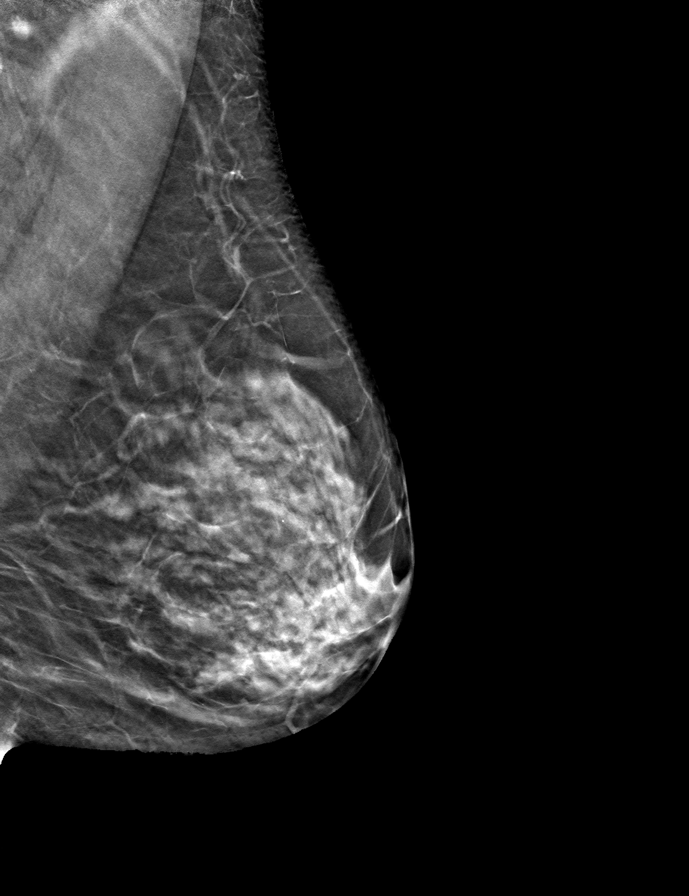

[9 of 24 positions shown; findings below may reference images not displayed]

ACR Breast Density Category c: The breast tissue is heterogeneously
dense, which may obscure small masses.
FINDINGS: In the left breast, a possible mass warrants further evaluation. In
the right breast, no findings suspicious for malignancy.

Images were processed with CAD.
IMPRESSION: Further evaluation is suggested for a possible mass in the left
breast.

RECOMMENDATION:
Diagnostic mammogram and possibly ultrasound of the left breast.
(Code:[id number])

The patient will be contacted regarding the findings, and additional
imaging will be scheduled.

BI-RADS CATEGORY  0: Incomplete. Need additional imaging evaluation
and/or prior mammograms for comparison.

## 2021-07-29 ENCOUNTER — Other Ambulatory Visit: Payer: Self-pay

## 2021-07-29 ENCOUNTER — Ambulatory Visit: Payer: Medicare HMO | Admitting: Dermatology

## 2021-07-29 DIAGNOSIS — L650 Telogen effluvium: Secondary | ICD-10-CM | POA: Diagnosis not present

## 2021-07-29 DIAGNOSIS — L409 Psoriasis, unspecified: Secondary | ICD-10-CM | POA: Diagnosis not present

## 2021-07-29 MED ORDER — FLUOCINOLONE ACETONIDE BODY 0.01 % EX OIL: 1.0000 "application " | TOPICAL_OIL | Freq: Every day | CUTANEOUS | 5 refills | Status: DC

## 2021-07-29 NOTE — Patient Instructions (Signed)

## 2021-07-29 NOTE — Progress Notes (Signed)
   Follow-Up Visit   Subjective  Cheryl Schroeder is a 65 y.o. female who presents for the following: Follow-up (Telogen Effluvium - doing a little bit better. She is taking Biotin./) and Psoriasis (6 month follow up of ears - She used Ketoconazole and hydrocortisone faithfully for a month and her ears got worse so now she is only using it once in a while.).  The following portions of the chart were reviewed this encounter and updated as appropriate:   Tobacco  Allergies  Meds  Problems  Med Hx  Surg Hx  Fam Hx     Review of Systems:  No other skin or systemic complaints except as noted in HPI or Assessment and Plan.  Objective  Well appearing patient in no apparent distress; mood and affect are within normal limits.  A focused examination was performed including scalp, ears. Relevant physical exam findings are noted in the Assessment and Plan.  Ears Pinkness and scale  Scalp Diffuse thinning of hair.    Assessment & Plan  Psoriasis Ears  Psoriasis is a chronic non-curable, but treatable genetic/hereditary disease that may have other systemic features affecting other organ systems such as joints (Psoriatic Arthritis). It is associated with an increased risk of inflammatory bowel disease, heart disease, non-alcoholic fatty liver disease, and depression.    Start Dermasmoothe F/S oil qd x 2 weeks then decrease to 5 times per week - Will plan Taclonex if not covered.  Fluocinolone Acetonide Body 0.01 % OIL - Ears Apply 1 application topically daily. Daily for 2 weeks then decrease to 5 times per week as needed.  Telogen effluvium Scalp Discussed low dose oral Minoxidil. Patient declines today. Continue Biotin 2.5 mg daily Telogen effluvium is a benign, self limited condition causing increased hair shedding usually for several months. It does not progress to baldness, and the hair eventually grows back on its own. It can be triggered by recent illness, recent surgery, thyroid  disease, low iron stores, vitamin D deficiency, fad diets or rapid weight loss, hormonal changes such as pregnancy or birth control pills, and some medication. Usually the hair loss starts 2-3 months after the illness or health change. Rarely, it can continue for longer than a year.  Return in about 3 months (around 10/29/2021).  I, Ashok Cordia, CMA, am acting as scribe for Sarina Ser, MD . Documentation: I have reviewed the above documentation for accuracy and completeness, and I agree with the above.  Sarina Ser, MD

## 2021-07-31 ENCOUNTER — Encounter: Payer: Self-pay | Admitting: Dermatology

## 2021-08-05 ENCOUNTER — Telehealth: Payer: Self-pay

## 2021-08-05 DIAGNOSIS — L409 Psoriasis, unspecified: Secondary | ICD-10-CM

## 2021-08-05 NOTE — Telephone Encounter (Signed)
Patient left a voicemail on the nurse line that Fluocinolone Oil was going to be $95 and she can not afford that at this time.  Any alternatives for her?

## 2021-08-06 MED ORDER — FLUOCINOLONE ACETONIDE BODY 0.01 % EX OIL
1.0000 | TOPICAL_OIL | Freq: Every day | CUTANEOUS | 5 refills | Status: DC
Start: 2021-08-06 — End: 2021-11-17

## 2021-08-06 NOTE — Telephone Encounter (Signed)
RX sent to [facility] and patient in okay with pharmacy change for $40.

## 2021-09-04 ENCOUNTER — Other Ambulatory Visit: Payer: Self-pay | Admitting: Family Medicine

## 2021-09-04 DIAGNOSIS — Z1231 Encounter for screening mammogram for malignant neoplasm of breast: Secondary | ICD-10-CM

## 2021-09-10 ENCOUNTER — Other Ambulatory Visit: Payer: Self-pay

## 2021-09-10 ENCOUNTER — Ambulatory Visit
Admission: RE | Admit: 2021-09-10 | Discharge: 2021-09-10 | Disposition: A | Discharge status: Home / Self Care | Payer: Medicare HMO | Source: Ambulatory Visit | Attending: Family Medicine | Admitting: Family Medicine

## 2021-09-10 DIAGNOSIS — Z1231 Encounter for screening mammogram for malignant neoplasm of breast: Secondary | ICD-10-CM | POA: Insufficient documentation

## 2021-10-08 ENCOUNTER — Other Ambulatory Visit: Payer: Self-pay

## 2021-10-08 ENCOUNTER — Other Ambulatory Visit (HOSPITAL_COMMUNITY): Payer: Self-pay | Admitting: Family Medicine

## 2021-10-08 ENCOUNTER — Ambulatory Visit
Admission: RE | Admit: 2021-10-08 | Discharge: 2021-10-08 | Disposition: A | Discharge status: Home / Self Care | Payer: Medicare HMO | Source: Ambulatory Visit | Attending: Family Medicine | Admitting: Family Medicine

## 2021-10-08 ENCOUNTER — Other Ambulatory Visit: Payer: Self-pay | Admitting: Family Medicine

## 2021-10-08 DIAGNOSIS — R2242 Localized swelling, mass and lump, left lower limb: Secondary | ICD-10-CM | POA: Insufficient documentation

## 2021-10-21 ENCOUNTER — Ambulatory Visit: Payer: Medicare HMO | Admitting: Dermatology

## 2021-10-22 ENCOUNTER — Ambulatory Visit
Admission: RE | Admit: 2021-10-22 | Discharge: 2021-10-22 | Disposition: A | Discharge status: Home / Self Care | Payer: Medicare HMO | Source: Ambulatory Visit | Attending: Family Medicine | Admitting: Family Medicine

## 2021-10-22 ENCOUNTER — Ambulatory Visit
Admission: RE | Admit: 2021-10-22 | Discharge: 2021-10-22 | Disposition: A | Discharge status: Home / Self Care | Payer: Medicare HMO | Attending: Family Medicine | Admitting: Family Medicine

## 2021-10-22 ENCOUNTER — Other Ambulatory Visit: Payer: Self-pay

## 2021-10-22 ENCOUNTER — Other Ambulatory Visit: Payer: Self-pay | Admitting: Family Medicine

## 2021-10-22 DIAGNOSIS — S20212A Contusion of left front wall of thorax, initial encounter: Secondary | ICD-10-CM

## 2021-10-24 ENCOUNTER — Other Ambulatory Visit: Payer: Self-pay | Admitting: Family Medicine

## 2021-10-24 DIAGNOSIS — R918 Other nonspecific abnormal finding of lung field: Secondary | ICD-10-CM

## 2021-10-28 ENCOUNTER — Ambulatory Visit
Admission: EM | Admit: 2021-10-28 | Discharge: 2021-10-28 | Disposition: A | Discharge status: Home / Self Care | Payer: Medicare HMO

## 2021-10-28 ENCOUNTER — Other Ambulatory Visit: Payer: Self-pay

## 2021-10-28 DIAGNOSIS — R0789 Other chest pain: Secondary | ICD-10-CM

## 2021-10-28 NOTE — ED Provider Notes (Signed)
[facility]    CSN: [id number] Arrival date & time: 10/28/21  9470      History   Chief Complaint Chief Complaint  Patient presents with   Rib Injury   Back Pain    Rib pain, lower back pain.     HPI Cheryl Schroeder is a 66 y.o. female.   HPI  89 old female here for evaluation of left-sided back pain.  Patient reports that she has been experiencing pain in her left mid back since December.  She states that the pain started after she had to assist her mother to the floor when she was trying to break her fall.  She has been evaluated by her PCP who initially thought she might have a rib fracture but then the patient reports that radiographs showed no evidence of fracture.  There are no x-ray results in epic.  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  She reports that the hydrocodone has been working for her but she is reluctant to take it because she does not want to become addicted to narcotics.  She reports that the chest x-ray also showed a nodule in her right lung and she is scheduled for a chest CT on 3 February.  Patient is concerned that she might have something in her lung causing the pain.  Patient reports that Toradol helps her best with her pain.  Past Medical History:  Diagnosis Date   Bipolar 2 disorder ([place])    Dysplastic nevus    R calf, txted in past, pt states ~2002   Fibromyalgia    Psoriasis     Patient Active Problem List   Diagnosis Date Noted   Plantar fasciitis 07/09/2014    Past Surgical History:  Procedure Laterality Date   ABDOMINAL HYSTERECTOMY     ADENOIDECTOMY     BREAST BIOPSY Right 1990   cyst   TONSILLECTOMY      OB History   No obstetric history on file.      Home Medications    Prior to Admission medications   Medication Sig Start Date End Date Taking? Authorizing Provider  amitriptyline (ELAVIL) 25 MG tablet Take by mouth. 05/29/20  Yes  [provider]  ALPRAZolam (XANAX) 0.5 MG tablet Take 0.5-1 mg by mouth at bedtime as needed. 10/25/21   [provider]  amoxicillin-clavulanate (AUGMENTIN) 875-125 MG tablet SMARTSIG:1 Tablet(s) By Mouth Every 12 Hours 10/22/21   [provider]  Calcium Carbonate-Vitamin D (CALCIUM + D PO) Take by mouth daily.    [provider]  cetirizine (ZYRTEC) 10 MG tablet Take 1 tablet (10 mg total) by mouth 2 (two) times daily. 06/14/16 08/31/19  Betancourt, Aura Fey, NP  ELIQUIS 5 MG TABS tablet Take by mouth. 10/09/21   [provider]  famotidine (PEPCID) 40 MG tablet  10/20/19   [provider]  Fluocinolone Acetonide Body 0.01 % OIL Apply 1 application topically daily. Daily for 2 weeks then decrease to 5 times per week as needed. 08/06/21 02/02/22  Ralene Bathe, MD  FLUoxetine (PROZAC) 40 MG capsule Take 40 mg by mouth daily.    [provider]  fluticasone (FLONASE) 50 MCG/ACT nasal spray Place 1 spray into both nostrils daily.    [provider]  gabapentin (NEURONTIN) 300 MG capsule Take 300 mg by mouth 3 (three) times daily. Three pills at night as needed  [provider]  HYDROcodone-acetaminophen (NORCO/VICODIN) 5-325 MG per tablet Take 1 tablet by mouth as needed for moderate pain.    [provider]  lamoTRIgine (LAMICTAL) 200 MG tablet  04/27/14   [provider]  loratadine (CLARITIN) 10 MG tablet Take 10 mg by mouth daily.    [provider]  omeprazole (PRILOSEC) 40 MG capsule Take 40 mg by mouth daily.    [provider]  pravastatin (PRAVACHOL) 40 MG tablet Take 40 mg by mouth daily.    [provider]    Family History Family History  Problem Relation Age of Onset   Breast cancer Maternal Aunt 71   Lung cancer Father     Social History Social History   Tobacco Use   Smoking status: Never   Smokeless tobacco: Never  Vaping Use   Vaping Use: Never used   Substance Use Topics   Alcohol use: No   Drug use: No     Allergies   Sulfa antibiotics   Review of Systems Review of Systems  Respiratory:  Negative for cough and shortness of breath.   Cardiovascular:  Positive for chest pain.  Musculoskeletal:  Positive for back pain.  Skin:  Negative for color change.  Hematological: Negative.   Psychiatric/Behavioral: Negative.      Physical Exam Triage Vital Signs ED Triage Vitals  Enc Vitals Group     BP 10/28/21 0850 (!) 142/94     Pulse Rate 10/28/21 0850 96     Resp 10/28/21 0850 16     Temp 10/28/21 0850 99.2 F (37.3 C)     Temp Source 10/28/21 0850 Oral     SpO2 10/28/21 0850 98 %     Weight --      Height --      Head Circumference --      Peak Flow --      Pain Score 10/28/21 0847 7     Pain Loc --      Pain Edu? --      Excl. in [place]? --    No data found.  Updated Vital Signs BP (!) 142/94 (BP Location: Right Arm)    Pulse 96    Temp 99.2 F (37.3 C) (Oral)    Resp 16    SpO2 98%   Visual Acuity Right Eye Distance:   Left Eye Distance:   Bilateral Distance:    Right Eye Near:   Left Eye Near:    Bilateral Near:     Physical Exam Vitals and nursing note reviewed.  Constitutional:      General: She is not in acute distress.    Appearance: Normal appearance. She is not ill-appearing.  HENT:     Head: Normocephalic and atraumatic.  Cardiovascular:     Rate and Rhythm: Normal rate and regular rhythm.     Pulses: Normal pulses.     Heart sounds: Normal heart sounds. No murmur heard.   No friction rub. No gallop.  Pulmonary:     Effort: Pulmonary effort is normal.     Breath sounds: Normal breath sounds. No wheezing, rhonchi or rales.  Chest:     Chest wall: No tenderness.  Skin:    General: Skin is warm and dry.     Capillary Refill: Capillary refill takes less than 2 seconds.     Findings: No bruising or erythema.  Neurological:     General: No focal deficit present.     Mental Status: She is  alert and oriented to person, place, and time.  Psychiatric:        Mood and Affect: Mood normal.        Behavior: Behavior normal.        Thought Content: Thought content normal.        Judgment: Judgment normal.     UC Treatments / Results  Labs (all labs ordered are listed, but only abnormal results are displayed) Labs Reviewed - No data to display  EKG   Radiology No results found.  Procedures Procedures (including critical care time)  Medications Ordered in UC Medications - No data to display  Initial Impression / Assessment and Plan / UC Course  I have reviewed the triage vital signs and the nursing notes.  Pertinent labs & imaging results that were available during my care of the patient were reviewed by me and considered in my medical decision making (see chart for details).  Patient is a nontoxic-appearing 52 old female here for evaluation of left back/posterior chest wall pain that is been present since December.  Patient has been treated with hydrocodone for her pain secondary to possible chest wall strain from helping to break her mother's fall in December.  She states that she takes it and it does help her pain but she does not want to become addicted to narcotics so she does not use it that often.  She states that Toradol gives her better pain relief.  Patient is currently on Eliquis for DVT.  She is also on amoxicillin for pneumonia prophylaxis.  Patient is reporting that a chest x-ray showed a lung nodule on her right and she is scheduled for a CT scan in February.  This information is not in epic.  The x-ray supposedly did not show a rib fracture either.  On physical exam patient has normal chest excursion and her lung sounds are clear to auscultation all fields.  Patient's posterior rib cage does not demonstrate any erythema or edema over the site of pain.  There is no crepitus when palpating the ribs where the patient decayed she is having pain.  I can also not  trigger the pain by pressing the soft tissue between the ribs.  The etiology of the patient's pain is unclear.  I explained to her that due to the fact that she is on Eliquis I cannot give her Toradol because it substantially increases her bleeding risk.  She will need to continue the hydrocodone for pain.  Her other option would be to use over-the-counter Lidoderm patches to attempt to get some pain relief.   Final Clinical Impressions(s) / UC Diagnoses   Final diagnoses:  Left-sided chest wall pain     Discharge Instructions      Continue your hydrocodone as previously prescribed.  Use over-the-counter lidocaine patches, 1 patch every 8 hours apply directly to the area of pain.  Follow-up with your primary care provider for new or continued pain.  You may also want to consider referral to the pain clinic for further management of your pain since it has been going on for a protracted period.     ED Prescriptions   None    I have reviewed the PDMP during this encounter.   Margarette Canada, NP 10/28/21 1017

## 2021-10-28 NOTE — Discharge Instructions (Addendum)
Continue your hydrocodone as previously prescribed.  Use over-the-counter lidocaine patches, 1 patch every 8 hours apply directly to the area of pain.  Follow-up with your primary care provider for new or continued pain.  You may also want to consider referral to the pain clinic for further management of your pain since it has been going on for a protracted period.

## 2021-10-28 NOTE — ED Triage Notes (Addendum)
Patient presents to Urgent Care with complaints of left sided rib pain and lower back since December. She states she had chest x-ray that showed nodules in her left lung. She states pain is worse when taking a deep breathe. She states she believes pain is related to lifting her mom back in December. Treating pain with hydrocodone and Augmentin.

## 2021-11-06 ENCOUNTER — Emergency Department: Payer: Medicare HMO

## 2021-11-06 ENCOUNTER — Inpatient Hospital Stay
Admission: EM | Admit: 2021-11-06 | Discharge: 2021-11-17 | DRG: 163 | Disposition: A | Discharge status: Hospice | Payer: Medicare HMO | Attending: Internal Medicine | Admitting: Internal Medicine

## 2021-11-06 ENCOUNTER — Encounter: Payer: Self-pay | Admitting: Emergency Medicine

## 2021-11-06 ENCOUNTER — Other Ambulatory Visit: Payer: Self-pay

## 2021-11-06 DIAGNOSIS — Z882 Allergy status to sulfonamides status: Secondary | ICD-10-CM

## 2021-11-06 DIAGNOSIS — K91871 Postprocedural hematoma of a digestive system organ or structure following other procedure: Secondary | ICD-10-CM | POA: Diagnosis not present

## 2021-11-06 DIAGNOSIS — I63441 Cerebral infarction due to embolism of right cerebellar artery: Secondary | ICD-10-CM | POA: Diagnosis not present

## 2021-11-06 DIAGNOSIS — C787 Secondary malignant neoplasm of liver and intrahepatic bile duct: Secondary | ICD-10-CM | POA: Diagnosis present

## 2021-11-06 DIAGNOSIS — D696 Thrombocytopenia, unspecified: Secondary | ICD-10-CM | POA: Diagnosis not present

## 2021-11-06 DIAGNOSIS — M899 Disorder of bone, unspecified: Secondary | ICD-10-CM

## 2021-11-06 DIAGNOSIS — R64 Cachexia: Secondary | ICD-10-CM | POA: Diagnosis present

## 2021-11-06 DIAGNOSIS — C3401 Malignant neoplasm of right main bronchus: Secondary | ICD-10-CM | POA: Diagnosis present

## 2021-11-06 DIAGNOSIS — C3490 Malignant neoplasm of unspecified part of unspecified bronchus or lung: Secondary | ICD-10-CM

## 2021-11-06 DIAGNOSIS — K5903 Drug induced constipation: Secondary | ICD-10-CM

## 2021-11-06 DIAGNOSIS — J910 Malignant pleural effusion: Secondary | ICD-10-CM | POA: Diagnosis present

## 2021-11-06 DIAGNOSIS — I2699 Other pulmonary embolism without acute cor pulmonale: Secondary | ICD-10-CM | POA: Insufficient documentation

## 2021-11-06 DIAGNOSIS — N179 Acute kidney failure, unspecified: Secondary | ICD-10-CM | POA: Diagnosis not present

## 2021-11-06 DIAGNOSIS — J690 Pneumonitis due to inhalation of food and vomit: Secondary | ICD-10-CM

## 2021-11-06 DIAGNOSIS — E43 Unspecified severe protein-calorie malnutrition: Secondary | ICD-10-CM | POA: Diagnosis present

## 2021-11-06 DIAGNOSIS — I2692 Saddle embolus of pulmonary artery without acute cor pulmonale: Secondary | ICD-10-CM | POA: Diagnosis present

## 2021-11-06 DIAGNOSIS — I951 Orthostatic hypotension: Secondary | ICD-10-CM | POA: Diagnosis not present

## 2021-11-06 DIAGNOSIS — R1013 Epigastric pain: Secondary | ICD-10-CM

## 2021-11-06 DIAGNOSIS — Z681 Body mass index (BMI) 19 or less, adult: Secondary | ICD-10-CM | POA: Diagnosis not present

## 2021-11-06 DIAGNOSIS — I639 Cerebral infarction, unspecified: Secondary | ICD-10-CM

## 2021-11-06 DIAGNOSIS — K769 Liver disease, unspecified: Secondary | ICD-10-CM | POA: Diagnosis not present

## 2021-11-06 DIAGNOSIS — R339 Retention of urine, unspecified: Secondary | ICD-10-CM | POA: Diagnosis not present

## 2021-11-06 DIAGNOSIS — N289 Disorder of kidney and ureter, unspecified: Secondary | ICD-10-CM

## 2021-11-06 DIAGNOSIS — C7931 Secondary malignant neoplasm of brain: Secondary | ICD-10-CM | POA: Diagnosis not present

## 2021-11-06 DIAGNOSIS — D6869 Other thrombophilia: Secondary | ICD-10-CM

## 2021-11-06 DIAGNOSIS — G629 Polyneuropathy, unspecified: Secondary | ICD-10-CM | POA: Diagnosis present

## 2021-11-06 DIAGNOSIS — E871 Hypo-osmolality and hyponatremia: Secondary | ICD-10-CM | POA: Diagnosis not present

## 2021-11-06 DIAGNOSIS — I248 Other forms of acute ischemic heart disease: Secondary | ICD-10-CM | POA: Diagnosis present

## 2021-11-06 DIAGNOSIS — Z515 Encounter for palliative care: Secondary | ICD-10-CM | POA: Diagnosis not present

## 2021-11-06 DIAGNOSIS — Y848 Other medical procedures as the cause of abnormal reaction of the patient, or of later complication, without mention of misadventure at the time of the procedure: Secondary | ICD-10-CM | POA: Diagnosis not present

## 2021-11-06 DIAGNOSIS — D649 Anemia, unspecified: Secondary | ICD-10-CM | POA: Diagnosis not present

## 2021-11-06 DIAGNOSIS — R918 Other nonspecific abnormal finding of lung field: Secondary | ICD-10-CM | POA: Diagnosis not present

## 2021-11-06 DIAGNOSIS — M797 Fibromyalgia: Secondary | ICD-10-CM | POA: Diagnosis present

## 2021-11-06 DIAGNOSIS — Z20822 Contact with and (suspected) exposure to covid-19: Secondary | ICD-10-CM | POA: Diagnosis present

## 2021-11-06 DIAGNOSIS — Z801 Family history of malignant neoplasm of trachea, bronchus and lung: Secondary | ICD-10-CM

## 2021-11-06 DIAGNOSIS — J90 Pleural effusion, not elsewhere classified: Secondary | ICD-10-CM

## 2021-11-06 DIAGNOSIS — E785 Hyperlipidemia, unspecified: Secondary | ICD-10-CM | POA: Diagnosis present

## 2021-11-06 DIAGNOSIS — Z7901 Long term (current) use of anticoagulants: Secondary | ICD-10-CM | POA: Diagnosis not present

## 2021-11-06 DIAGNOSIS — Y783 Surgical instruments, materials and radiological devices (including sutures) associated with adverse incidents: Secondary | ICD-10-CM | POA: Diagnosis not present

## 2021-11-06 DIAGNOSIS — R296 Repeated falls: Secondary | ICD-10-CM | POA: Diagnosis present

## 2021-11-06 DIAGNOSIS — C7951 Secondary malignant neoplasm of bone: Secondary | ICD-10-CM | POA: Diagnosis present

## 2021-11-06 DIAGNOSIS — Z66 Do not resuscitate: Secondary | ICD-10-CM | POA: Diagnosis not present

## 2021-11-06 DIAGNOSIS — Z79899 Other long term (current) drug therapy: Secondary | ICD-10-CM

## 2021-11-06 DIAGNOSIS — Z803 Family history of malignant neoplasm of breast: Secondary | ICD-10-CM

## 2021-11-06 DIAGNOSIS — E041 Nontoxic single thyroid nodule: Secondary | ICD-10-CM | POA: Diagnosis present

## 2021-11-06 DIAGNOSIS — T402X5A Adverse effect of other opioids, initial encounter: Secondary | ICD-10-CM | POA: Diagnosis not present

## 2021-11-06 DIAGNOSIS — Z86718 Personal history of other venous thrombosis and embolism: Secondary | ICD-10-CM

## 2021-11-06 DIAGNOSIS — G9389 Other specified disorders of brain: Secondary | ICD-10-CM

## 2021-11-06 DIAGNOSIS — S36112A Contusion of liver, initial encounter: Secondary | ICD-10-CM

## 2021-11-06 DIAGNOSIS — I2782 Chronic pulmonary embolism: Secondary | ICD-10-CM | POA: Diagnosis not present

## 2021-11-06 DIAGNOSIS — I63423 Cerebral infarction due to embolism of bilateral anterior cerebral arteries: Secondary | ICD-10-CM | POA: Diagnosis not present

## 2021-11-06 DIAGNOSIS — I6310 Cerebral infarction due to embolism of unspecified precerebral artery: Secondary | ICD-10-CM | POA: Diagnosis not present

## 2021-11-06 DIAGNOSIS — I82409 Acute embolism and thrombosis of unspecified deep veins of unspecified lower extremity: Secondary | ICD-10-CM

## 2021-11-06 HISTORY — DX: Other pulmonary embolism without acute cor pulmonale: I26.99

## 2021-11-06 LAB — RESP PANEL BY RT-PCR (FLU A&B, COVID) ARPGX2
Influenza A by PCR: NEGATIVE
Influenza B by PCR: NEGATIVE
SARS Coronavirus 2 by RT PCR: NEGATIVE

## 2021-11-06 LAB — PROTIME-INR
INR: 1.3 — ABNORMAL HIGH (ref 0.8–1.2)
Prothrombin Time: 16.2 seconds — ABNORMAL HIGH (ref 11.4–15.2)

## 2021-11-06 LAB — CBC WITH DIFFERENTIAL/PLATELET
Abs Immature Granulocytes: 0.09 10*3/uL — ABNORMAL HIGH (ref 0.00–0.07)
Basophils Absolute: 0.1 10*3/uL (ref 0.0–0.1)
Basophils Relative: 1 %
Eosinophils Absolute: 0.3 10*3/uL (ref 0.0–0.5)
Eosinophils Relative: 3 %
HCT: 38 % (ref 36.0–46.0)
Hemoglobin: 12.1 g/dL (ref 12.0–15.0)
Immature Granulocytes: 1 %
Lymphocytes Relative: 11 %
Lymphs Abs: 1.1 10*3/uL (ref 0.7–4.0)
MCH: 29.7 pg (ref 26.0–34.0)
MCHC: 31.8 g/dL (ref 30.0–36.0)
MCV: 93.4 fL (ref 80.0–100.0)
Monocytes Absolute: 0.7 10*3/uL (ref 0.1–1.0)
Monocytes Relative: 7 %
Neutro Abs: 7.8 10*3/uL — ABNORMAL HIGH (ref 1.7–7.7)
Neutrophils Relative %: 77 %
Platelets: 139 10*3/uL — ABNORMAL LOW (ref 150–400)
RBC: 4.07 MIL/uL (ref 3.87–5.11)
RDW: 12.5 % (ref 11.5–15.5)
WBC: 9.9 10*3/uL (ref 4.0–10.5)
nRBC: 0 % (ref 0.0–0.2)

## 2021-11-06 LAB — BRAIN NATRIURETIC PEPTIDE: B Natriuretic Peptide: 43.6 pg/mL (ref 0.0–100.0)

## 2021-11-06 LAB — COMPREHENSIVE METABOLIC PANEL
ALT: 20 U/L (ref 0–44)
AST: 34 U/L (ref 15–41)
Albumin: 4 g/dL (ref 3.5–5.0)
Alkaline Phosphatase: 196 U/L — ABNORMAL HIGH (ref 38–126)
Anion gap: 9 (ref 5–15)
BUN: 16 mg/dL (ref 8–23)
CO2: 25 mmol/L (ref 22–32)
Calcium: 10 mg/dL (ref 8.9–10.3)
Chloride: 103 mmol/L (ref 98–111)
Creatinine, Ser: 0.85 mg/dL (ref 0.44–1.00)
GFR, Estimated: >60 mL/min (ref >60)
Glucose, Bld: 154 mg/dL — ABNORMAL HIGH (ref 70–99)
Potassium: 3.7 mmol/L (ref 3.5–5.1)
Sodium: 137 mmol/L (ref 135–145)
Total Bilirubin: 0.8 mg/dL (ref 0.3–1.2)
Total Protein: 8.1 g/dL (ref 6.5–8.1)

## 2021-11-06 LAB — TROPONIN I (HIGH SENSITIVITY): Troponin I (High Sensitivity): 396 ng/L (ref <18)

## 2021-11-06 LAB — HEPARIN LEVEL (UNFRACTIONATED): Heparin Unfractionated: >1.1 IU/mL — ABNORMAL HIGH (ref 0.30–0.70)

## 2021-11-06 LAB — LIPASE, BLOOD: Lipase: 35 U/L (ref 11–51)

## 2021-11-06 LAB — APTT
aPTT: 32 seconds (ref 24–36)
aPTT: 60 seconds — ABNORMAL HIGH (ref 24–36)

## 2021-11-06 MED ORDER — HYDROCODONE-ACETAMINOPHEN 5-325 MG PO TABS
1.0000 | ORAL_TABLET | ORAL | Status: DC | PRN
  Administered 2021-11-06 – 2021-11-16 (×9): 1 via ORAL
  Filled 2021-11-06 (×11): qty 1

## 2021-11-06 MED ORDER — SODIUM CHLORIDE 0.9 % IV SOLN: 250.0000 mL | INTRAVENOUS | Status: DC | PRN

## 2021-11-06 MED ORDER — HEPARIN BOLUS VIA INFUSION
750.0000 [IU] | Freq: Once | INTRAVENOUS | Status: AC
  Administered 2021-11-06: 750 [IU] via INTRAVENOUS
  Filled 2021-11-06: qty 750

## 2021-11-06 MED ORDER — ONDANSETRON HCL 4 MG/2ML IJ SOLN
4.0000 mg | Freq: Once | INTRAMUSCULAR | Status: AC
  Administered 2021-11-06: 4 mg via INTRAVENOUS
  Filled 2021-11-06: qty 2

## 2021-11-06 MED ORDER — SODIUM CHLORIDE 0.9 % IV BOLUS
1000.0000 mL | Freq: Once | INTRAVENOUS | Status: AC
Start: 2021-11-06 — End: 2021-11-06
  Administered 2021-11-06: 1000 mL via INTRAVENOUS

## 2021-11-06 MED ORDER — ONDANSETRON HCL 4 MG/2ML IJ SOLN
4.0000 mg | Freq: Four times a day (QID) | INTRAMUSCULAR | Status: DC | PRN
  Administered 2021-11-07 – 2021-11-15 (×4): 4 mg via INTRAVENOUS
  Filled 2021-11-06 (×4): qty 2

## 2021-11-06 MED ORDER — FLUTICASONE PROPIONATE 50 MCG/ACT NA SUSP
1.0000 | Freq: Every day | NASAL | Status: DC
  Administered 2021-11-17: 1 via NASAL
  Filled 2021-11-06 (×2): qty 16

## 2021-11-06 MED ORDER — IOHEXOL 350 MG/ML SOLN
100.0000 mL | Freq: Once | INTRAVENOUS | Status: AC | PRN
  Administered 2021-11-06: 100 mL via INTRAVENOUS
  Filled 2021-11-06: qty 100

## 2021-11-06 MED ORDER — HEPARIN BOLUS VIA INFUSION
3600.0000 [IU] | Freq: Once | INTRAVENOUS | Status: AC
  Administered 2021-11-06: 3600 [IU] via INTRAVENOUS
  Filled 2021-11-06: qty 3600

## 2021-11-06 MED ORDER — PANTOPRAZOLE SODIUM 40 MG PO TBEC
40.0000 mg | DELAYED_RELEASE_TABLET | Freq: Every day | ORAL | Status: DC
  Administered 2021-11-06 – 2021-11-17 (×12): 40 mg via ORAL
  Filled 2021-11-06 (×12): qty 1

## 2021-11-06 MED ORDER — PANTOPRAZOLE SODIUM 40 MG IV SOLR
40.0000 mg | Freq: Once | INTRAVENOUS | Status: AC
  Administered 2021-11-06: 40 mg via INTRAVENOUS
  Filled 2021-11-06: qty 40

## 2021-11-06 MED ORDER — PRAVASTATIN SODIUM 20 MG PO TABS
40.0000 mg | ORAL_TABLET | Freq: Every day | ORAL | Status: DC
  Administered 2021-11-06 – 2021-11-16 (×11): 40 mg via ORAL
  Filled 2021-11-06 (×10): qty 2

## 2021-11-06 MED ORDER — KETOROLAC TROMETHAMINE 30 MG/ML IJ SOLN
30.0000 mg | Freq: Once | INTRAMUSCULAR | Status: AC
  Administered 2021-11-06: 30 mg via INTRAVENOUS
  Filled 2021-11-06: qty 1

## 2021-11-06 MED ORDER — HEPARIN (PORCINE) 25000 UT/250ML-% IV SOLN
1000.0000 [IU]/h | INTRAVENOUS | Status: DC
  Administered 2021-11-06: 900 [IU]/h via INTRAVENOUS
  Administered 2021-11-07: 1000 [IU]/h via INTRAVENOUS
  Filled 2021-11-06 (×2): qty 250

## 2021-11-06 MED ORDER — ENSURE ENLIVE PO LIQD
237.0000 mL | Freq: Two times a day (BID) | ORAL | Status: DC
  Administered 2021-11-07 – 2021-11-13 (×7): 237 mL via ORAL

## 2021-11-06 MED ORDER — GABAPENTIN 300 MG PO CAPS
300.0000 mg | ORAL_CAPSULE | Freq: Three times a day (TID) | ORAL | Status: DC
  Administered 2021-11-06 – 2021-11-17 (×32): 300 mg via ORAL
  Filled 2021-11-06 (×32): qty 1

## 2021-11-06 MED ORDER — SODIUM CHLORIDE 0.9% FLUSH
3.0000 mL | Freq: Two times a day (BID) | INTRAVENOUS | Status: DC
  Administered 2021-11-06 – 2021-11-17 (×17): 3 mL via INTRAVENOUS

## 2021-11-06 MED ORDER — ACETAMINOPHEN 325 MG PO TABS
650.0000 mg | ORAL_TABLET | ORAL | Status: DC | PRN
  Administered 2021-11-06 – 2021-11-17 (×2): 650 mg via ORAL
  Filled 2021-11-06 (×2): qty 2

## 2021-11-06 MED ORDER — FLUOXETINE HCL 20 MG PO CAPS
40.0000 mg | ORAL_CAPSULE | Freq: Every day | ORAL | Status: DC
  Administered 2021-11-07 – 2021-11-17 (×11): 40 mg via ORAL
  Filled 2021-11-06 (×11): qty 2

## 2021-11-06 MED ORDER — ALPRAZOLAM 0.5 MG PO TABS
0.5000 mg | ORAL_TABLET | Freq: Every evening | ORAL | Status: DC | PRN
  Administered 2021-11-09 – 2021-11-13 (×2): 1 mg via ORAL
  Filled 2021-11-06 (×2): qty 2

## 2021-11-06 MED ORDER — LAMOTRIGINE 100 MG PO TABS
200.0000 mg | ORAL_TABLET | Freq: Every day | ORAL | Status: DC
  Administered 2021-11-07 – 2021-11-17 (×11): 200 mg via ORAL
  Filled 2021-11-06 (×11): qty 2

## 2021-11-06 MED ORDER — MORPHINE SULFATE (PF) 4 MG/ML IV SOLN
4.0000 mg | Freq: Once | INTRAVENOUS | Status: AC
  Administered 2021-11-06: 4 mg via INTRAVENOUS
  Filled 2021-11-06: qty 1

## 2021-11-06 MED ORDER — SODIUM CHLORIDE 0.9% FLUSH: 3.0000 mL | INTRAVENOUS | Status: DC | PRN

## 2021-11-06 MED ORDER — ACETAMINOPHEN 500 MG PO TABS: 500.0000 mg | ORAL_TABLET | ORAL | Status: DC | PRN

## 2021-11-06 NOTE — ED Triage Notes (Signed)
Arrives from home via [facility].  C/O fall yesterday. Stumbled and hit chair arm yesterday.  C/O left side/ back pain.  Full ROM. VS wnl.

## 2021-11-06 NOTE — ED Notes (Signed)
Lab called, troponin is 396. ED provider notified.

## 2021-11-06 NOTE — ED Provider Notes (Signed)
Patient signed out to me pending results of her imaging.  She is a 66 year old female recently diagnosed with a DVT about 3 weeks ago who is on Eliquis who presents with some left upper abdominal pain and back pain for several weeks.  She has very mild shortness of breath no frank chest pain.  The DVT was diagnosed by her outpatient physician and she is aware that she may have a mass on her lungs.  She was encouraged by her physician to have a CAT scan a were unable to arrange this as an outpatient.  CT abdomen pelvis and CT angio of the chest demonstrate pulmonary embolism with moderate thrombus burden but no obvious right ventricular strain.  Troponin is about 400.  Vital signs are within normal limits.  She looks to also have likely a lung cancer with diffuse mets in the axial skeleton and potentially the liver.  Given her elevated Lance Bosch will admit.  Placed on heparin.   Rada Hay, MD 11/06/21 [phone]

## 2021-11-06 NOTE — Consult Note (Signed)
ANTICOAGULATION CONSULT NOTE - Initial Consult  Pharmacy Consult for Heparin dosing Indication: pulmonary embolus  Allergies  Allergen Reactions   Sulfa Antibiotics Nausea Only and Nausea And Vomiting    Patient Measurements: Height: 5' 5.51" (166.4 cm) Weight: 55.5 kg (122 lb 4.8 oz) IBW/kg (Calculated) : 58.18 Heparin Dosing Weight: 52.2kg  Vital Signs: Temp: 98.3 F (36.8 C) (02/01 1141) Temp Source: Oral (02/01 1141) BP: 126/69 (02/01 2040) Pulse Rate: 85 (02/01 2040)  Labs: Recent Labs    11/06/21 1257 11/06/21 1533 11/06/21 2301  HGB 12.1  --   --   HCT 38.0  --   --   PLT 139*  --   --   APTT 32  --  60*  LABPROT 16.2*  --   --   INR 1.3*  --   --   HEPARINUNFRC  --  >1.10*  --   CREATININE 0.85  --   --   TROPONINIHS 396*  --   --      Estimated Creatinine Clearance: 57 mL/min (by C-G formula based on SCr of 0.85 mg/dL).   Medical History: Past Medical History:  Diagnosis Date   Bipolar 2 disorder ([place])    Dysplastic nevus    R calf, txted in past, pt states ~2002   Fibromyalgia    Psoriasis     Medications:  Apixaban 5mg  BID (for DVT lower extremity)  Assessment: patient is a 66 year old female recently diagnosed with a DVT about 3 weeks ago who is on Eliquis who presents with some left upper abdominal pain and back pain for several weeks.CT positive for pulmonary emboli.  Goal of Therapy:  Heparin level 0.3-0.7 units/ml aPTT 66-102 seconds Monitor platelets by anticoagulation protocol: Yes   Plan:  2/1:  aPTT @ 2301 = 60, SUBtherapeutic  Will order heparin 750 units IV X 1 bolus and increase drip rate to 1000 units/hr.  Will recheck aPTT/HL 6 hrs after rate change. Will continue to use aPTT to guide dosing until aPTT and HL are therapeutic.   Fareed Fung D 11/06/2021,11:28 PM

## 2021-11-06 NOTE — ED Provider Notes (Signed)
[facility] Provider Note    Event Date/Time   First MD Initiated Contact with Patient 11/06/21 1241     (approximate)   History   Chest Pain   HPI  Cheryl Schroeder is a 66 y.o. female with past history of bipolar disorder, fibromyalgia, abdominal hysterectomy who comes ED complaining of epigastric pain for the past 3 weeks, continuous, waxing and waning, no aggravating or alleviating factors.  Decreased oral intake.  Pain is nonradiating.  Feels pleuritic.  No chest pain or shortness of breath.  She does also note that due to this pain, she fell yesterday landing on a chair which has then caused her to have left chest wall pain as well.  No head injury or neck pain.  No syncope.     Physical Exam   Triage Vital Signs: ED Triage Vitals  Enc Vitals Group     BP 11/06/21 1141 124/78     Pulse Rate 11/06/21 1141 97     Resp 11/06/21 1141 17     Temp 11/06/21 1141 98.3 F (36.8 C)     Temp Source 11/06/21 1141 Oral     SpO2 11/06/21 1141 97 %     Weight 11/06/21 1138 115 lb 1.3 oz (52.2 kg)     Height 11/06/21 1138 5' 5.5" (1.664 m)     Head Circumference --      Peak Flow --      Pain Score 11/06/21 1138 5     Pain Loc --      Pain Edu? --      Excl. in [place]? --     Most recent vital signs: Vitals:   11/06/21 1141  BP: 124/78  Pulse: 97  Resp: 17  Temp: 98.3 F (36.8 C)  SpO2: 97%     General: Awake, no distress.  CV:  Good peripheral perfusion.  Regular rate rhythm Resp:  Normal effort.  Clear to auscultation bilaterally Abd:  No distention.  Epigastric tenderness. Other:  No calf asymmetry or tenderness.   ED Results / Procedures / Treatments   Labs (all labs ordered are listed, but only abnormal results are displayed) Labs Reviewed  CBC WITH DIFFERENTIAL/PLATELET - Abnormal; Notable for the following components:      Result Value   Platelets 139 (*)    Neutro Abs 7.8 (*)    Abs Immature Granulocytes 0.09 (*)    All other  components within normal limits  COMPREHENSIVE METABOLIC PANEL - Abnormal; Notable for the following components:   Glucose, Bld 154 (*)    Alkaline Phosphatase 196 (*)    All other components within normal limits  LIPASE, BLOOD     EKG  Interpreted by me Normal sinus rhythm rate of 93.  Normal axis and intervals.  Normal QRS ST segments and T waves.  No ischemic changes.   RADIOLOGY X-ray left ribs reviewed and interpreted by me, negative for fracture.  Radiology report reviewed.  CT chest abdomen pelvis pending    PROCEDURES:  Critical Care performed: No  Procedures   MEDICATIONS ORDERED IN ED: Medications  sodium chloride 0.9 % bolus 1,000 mL (1,000 mLs Intravenous New Bag/Given 11/06/21 1324)  ondansetron (ZOFRAN) injection 4 mg (4 mg Intravenous Given 11/06/21 1320)  morphine (PF) 4 MG/ML injection 4 mg (4 mg Intravenous Given 11/06/21 1323)  pantoprazole (PROTONIX) injection 40 mg (40 mg Intravenous Given 11/06/21 1324)  iohexol (OMNIPAQUE) 350 MG/ML injection 100 mL (100 mLs Intravenous Contrast Given  11/06/21 1441)     IMPRESSION / MDM / ASSESSMENT AND PLAN / ED COURSE  I reviewed the triage vital signs and the nursing notes.                              Differential diagnosis includes, but is not limited to, pulmonary embolism, pancreatitis, cholecystitis, gastritis, colitis, intra-abdominal abscess, dehydration    Patient presents with persistent upper abdominal pain with tenderness.  This caused a minor fall and she has some musculoskeletal chest wall pain as result, but no serious traumatic injuries.  Due to the chronicity of her symptoms, age, tenderness on exam, CT scans obtained.  Given the patient is signed out to the oncoming provider to follow-up on results.  Lab panel is unremarkable.  Patient given IV morphine Protonix and Zofran for symptom relief.     FINAL CLINICAL IMPRESSION(S) / ED DIAGNOSES   Final diagnoses:  Epigastric pain     Rx / DC  Orders   ED Discharge Orders     None        Note:  This document was prepared using Dragon voice recognition software and may include unintentional dictation errors.   Carrie Mew, MD 11/06/21 [phone]

## 2021-11-06 NOTE — Consult Note (Signed)
ANTICOAGULATION CONSULT NOTE - Initial Consult  Pharmacy Consult for Heparin dosing Indication: pulmonary embolus  Allergies  Allergen Reactions   Sulfa Antibiotics Nausea Only and Nausea And Vomiting    Patient Measurements: Height: 5' 5.5" (166.4 cm) Weight: 52.2 kg (115 lb 1.3 oz) IBW/kg (Calculated) : 58.15 Heparin Dosing Weight: 52.2kg  Vital Signs: Temp: 98.3 F (36.8 C) (02/01 1141) Temp Source: Oral (02/01 1141) BP: 139/80 (02/01 1545) Pulse Rate: 81 (02/01 1545)  Labs: Recent Labs    11/06/21 1257 11/06/21 1533  HGB 12.1  --   HCT 38.0  --   PLT 139*  --   APTT 32  --   LABPROT 16.2*  --   INR 1.3*  --   HEPARINUNFRC  --  >1.10*  CREATININE 0.85  --   TROPONINIHS 396*  --     Estimated Creatinine Clearance: 53.7 mL/min (by C-G formula based on SCr of 0.85 mg/dL).   Medical History: Past Medical History:  Diagnosis Date   Bipolar 2 disorder ([place])    Dysplastic nevus    R calf, txted in past, pt states ~2002   Fibromyalgia    Psoriasis     Medications:  Apixaban 5mg  BID (for DVT lower extremity)  Assessment: patient is a 66 year old female recently diagnosed with a DVT about 3 weeks ago who is on Eliquis who presents with some left upper abdominal pain and back pain for several weeks.CT positive for pulmonary emboli.  Goal of Therapy:  Heparin level 0.3-0.7 units/ml aPTT 66-102 seconds Monitor platelets by anticoagulation protocol: Yes   Plan:  Give 3600 units bolus x 1 Start heparin infusion at 900 units/hr Continue to monitor H&H and platelets  Berta Minor 11/06/2021,4:21 PM

## 2021-11-06 NOTE — H&P (Signed)
History and Physical    Cheryl Schroeder ZHG:[id number] DOB: November 29, 1955 DOA: 11/06/2021  PCP: Lynnell Jude, MD (Confirm with patient/family/NH records and if not entered, this has to be entered at [facility] point of entry) Patient coming from: Home  I have personally briefly reviewed patient's old medical records in [place]  Chief Complaint: Chest pain, feeling dizzy, falls  HPI: Cheryl Schroeder is a 66 y.o. female with medical history significant of recently diagnosed left leg DVT 3 weeks ago on Eliquis, fibromyalgia, bipolar disorder, HLD, chronic peripheral neuropathy, presented with worsening of chest pains, and frequent falls.  Symptoms started about 3 weeks ago, symptoms almost developed the same time, which includes left leg swelling and pain, " tenderness of bilateral rib cages" pleural chest pain worsening with deep breath and cough, and frequent feeling of lightheadedness and to fall several times last week.  No loss of conscious no head injury.  Denies any fever chills, no cough.  She has been taking increasing dose of hydrocodone for the chest pain with little help.  ED Course: No hypoxia, no hypotension no tachycardia.  CTA positive for acute saddle embolism in the left main pulm artery extending to the left upper lobe and left lower branches, and there are few small filling defects in the subsegmental branches in the right middle lobe and right lower lobe.  Also there is a 4.8 cm homogenous opacity in the anterior right middle lung field, suspicious for primary neoplasm.  Multiple hypodensity lesions on ribs, thoracic and lumbar spine suspicious for metastasis.  Review of Systems: As per HPI otherwise 14 point review of systems negative.    Past Medical History:  Diagnosis Date   Bipolar 2 disorder ([place])    Dysplastic nevus    R calf, txted in past, pt states ~2002   Fibromyalgia    Psoriasis     Past Surgical History:  Procedure Laterality Date   ABDOMINAL  HYSTERECTOMY     ADENOIDECTOMY     BREAST BIOPSY Right 1990   cyst   TONSILLECTOMY       reports that she has never smoked. She has never used smokeless tobacco. She reports that she does not drink alcohol and does not use drugs.  Allergies  Allergen Reactions   Sulfa Antibiotics Nausea Only and Nausea And Vomiting    Family History  Problem Relation Age of Onset   Breast cancer Maternal Aunt 69   Lung cancer Father      Prior to Admission medications   Medication Sig Start Date End Date Taking? Authorizing Provider  ALPRAZolam Duanne Moron) 0.5 MG tablet Take 0.5-1 mg by mouth at bedtime as needed. 10/25/21  Yes [provider]  ELIQUIS 5 MG TABS tablet Take 5 mg by mouth 2 (two) times daily. 10/09/21  Yes [provider]  FLUoxetine (PROZAC) 40 MG capsule Take 40 mg by mouth daily.   Yes [provider]  gabapentin (NEURONTIN) 300 MG capsule Take 300 mg by mouth 3 (three) times daily. Three pills at night as needed   Yes [provider]  HYDROcodone-acetaminophen (NORCO/VICODIN) 5-325 MG per tablet Take 1 tablet by mouth as needed for moderate pain.   Yes [provider]  lamoTRIgine (LAMICTAL) 200 MG tablet Take 200 mg by mouth daily. 04/27/14  Yes [provider]  omeprazole (PRILOSEC) 40 MG capsule Take 40 mg by mouth daily.   Yes [provider]  pravastatin (PRAVACHOL) 40 MG tablet Take 40 mg by  mouth daily.   Yes [provider]  acetaminophen (TYLENOL) 500 MG tablet Take 500-1,000 mg by mouth as needed.    [provider]  amoxicillin-clavulanate (AUGMENTIN) 875-125 MG tablet SMARTSIG:1 Tablet(s) By Mouth Every 12 Hours Patient not taking: Reported on 11/06/2021 10/22/21   [provider]  Calcium Carbonate-Vitamin D (CALCIUM + D PO) Take by mouth daily. Patient not taking: Reported on 11/06/2021    [provider]  Fluocinolone Acetonide Body 0.01 % OIL Apply 1 application topically  daily. Daily for 2 weeks then decrease to 5 times per week as needed. 08/06/21 02/02/22  Ralene Bathe, MD  fluticasone (FLONASE) 50 MCG/ACT nasal spray Place 1 spray into both nostrils daily.    [provider]    Physical Exam: Vitals:   11/06/21 1138 11/06/21 1141 11/06/21 1545  BP:  124/78 139/80  Pulse:  97 81  Resp:  17 19  Temp:  98.3 F (36.8 C)   TempSrc:  Oral   SpO2:  97% 96%  Weight: 52.2 kg    Height: 5' 5.5" (1.664 m)      Constitutional: NAD, calm, comfortable Vitals:   11/06/21 1138 11/06/21 1141 11/06/21 1545  BP:  124/78 139/80  Pulse:  97 81  Resp:  17 19  Temp:  98.3 F (36.8 C)   TempSrc:  Oral   SpO2:  97% 96%  Weight: 52.2 kg    Height: 5' 5.5" (1.664 m)     Eyes: PERRL, lids and conjunctivae normal ENMT: Mucous membranes are moist. Posterior pharynx clear of any exudate or lesions.Normal dentition.  Neck: normal, supple, no masses, no thyromegaly Respiratory: clear to auscultation bilaterally, no wheezing, no crackles. Normal respiratory effort. No accessory muscle use.  Cardiovascular: Regular rate and rhythm, no murmurs / rubs / gallops. No extremity edema. 2+ pedal pulses. No carotid bruits.  Abdomen: no tenderness, no masses palpated. No hepatosplenomegaly. Bowel sounds positive.  Musculoskeletal: no clubbing / cyanosis. No joint deformity upper and lower extremities. Good ROM, no contractures. Normal muscle tone.  Skin: no rashes, lesions, ulcers. No induration Neurologic: CN 2-12 grossly intact. Sensation intact, DTR normal. Strength 5/5 in all 4.  Psychiatric: Normal judgment and insight. Alert and oriented x 3. Normal mood.    Labs on Admission: I have personally reviewed following labs and imaging studies  CBC: Recent Labs  Lab 11/06/21 1257  WBC 9.9  NEUTROABS 7.8*  HGB 12.1  HCT 38.0  MCV 93.4  PLT 938*   Basic Metabolic Panel: Recent Labs  Lab 11/06/21 1257  NA 137  K 3.7  CL 103  CO2 25  GLUCOSE 154*  BUN  16  CREATININE 0.85  CALCIUM 10.0   GFR: Estimated Creatinine Clearance: 53.7 mL/min (by C-G formula based on SCr of 0.85 mg/dL). Liver Function Tests: Recent Labs  Lab 11/06/21 1257  AST 34  ALT 20  ALKPHOS 196*  BILITOT 0.8  PROT 8.1  ALBUMIN 4.0   Recent Labs  Lab 11/06/21 1257  LIPASE 35   No results for input(s): AMMONIA in the last 168 hours. Coagulation Profile: Recent Labs  Lab 11/06/21 1257  INR 1.3*   Cardiac Enzymes: No results for input(s): CKTOTAL, CKMB, CKMBINDEX, TROPONINI in the last 168 hours. BNP (last 3 results) No results for input(s): PROBNP in the last 8760 hours. HbA1C: No results for input(s): HGBA1C in the last 72 hours. CBG: No results for input(s): GLUCAP in the last 168 hours. Lipid Profile: No results for  input(s): CHOL, HDL, LDLCALC, TRIG, CHOLHDL, LDLDIRECT in the last 72 hours. Thyroid Function Tests: No results for input(s): TSH, T4TOTAL, FREET4, T3FREE, THYROIDAB in the last 72 hours. Anemia Panel: No results for input(s): VITAMINB12, FOLATE, FERRITIN, TIBC, IRON, RETICCTPCT in the last 72 hours. Urine analysis: No results found for: COLORURINE, APPEARANCEUR, [place], [place], GLUCOSEU, HGBUR, BILIRUBINUR, KETONESUR, PROTEINUR, UROBILINOGEN, NITRITE, LEUKOCYTESUR  Radiological Exams on Admission: DG Ribs Unilateral W/Chest Left  Result Date: 11/06/2021 CLINICAL DATA:  Fall, left rib pain EXAM: LEFT RIBS AND CHEST - 3+ VIEW COMPARISON:  Chest x-ray 10/22/2021 FINDINGS: No acute fracture identified in the left ribs. Cardiomediastinal silhouette is unchanged. Persistent ovoid masslike opacity adjacent to the fissure in the right lung measuring approximately 5 x 3.2 cm, grossly unchanged. Linear opacities in the left lower lung zone likely represent subsegmental atelectasis. Hyperinflated lungs with mildly prominent interstitial lung markings. No pleural effusion or pneumothorax visualized. IMPRESSION: 1. No left rib fractures identified.  2. Persistent and unchanged ovoid perifissural masslike opacity in the right lung. 3. COPD. Electronically Signed   By: Ofilia Neas M.D.   On: 11/06/2021 12:16   CT Angio Chest PE W and/or Wo Contrast  Result Date: 11/06/2021 CLINICAL DATA:  Chest pain, recent trauma EXAM: CT ANGIOGRAPHY CHEST WITH CONTRAST TECHNIQUE: Multidetector CT imaging of the chest was performed using the standard protocol during bolus administration of intravenous contrast. Multiplanar CT image reconstructions and MIPs were obtained to evaluate the vascular anatomy. RADIATION DOSE REDUCTION: This exam was performed according to the departmental dose-optimization program which includes automated exposure control, adjustment of the mA and/or kV according to patient size and/or use of iterative reconstruction technique. CONTRAST:  131mL OMNIPAQUE IOHEXOL 350 MG/ML SOLN COMPARISON:  Chest radiographs done earlier today FINDINGS: Cardiovascular: There is homogeneous enhancement in the thoracic aorta. There is mild ectasia of main pulmonary artery measuring 3.2 cm. There is saddle embolus in the left main pulmonary artery extending into the left upper lobe and left lower lobe branches. There are few small filling defects in the subsegmental branches in the right middle lobe and right lower lobe. There are no imaging signs of right ventricular strain. Mediastinum/Nodes: No significant lymphadenopathy seen. There is 2.5 cm low-density lesion in the enlarged left lobe of thyroid. Lungs/Pleura: There is moderate right pleural effusion. There are small patchy infiltrates in the medial aspect of right upper lobe in the right upper lung fields. There is 5.2 x 4.1 cm homogeneous density inseparable from interlobar fissure in the anterior right parahilar region. There is narrowing of the lumen of the bronchus leading to this homogeneous opacity in the anteromedial right mid lung fields. Linear patchy infiltrates are seen in both lower lung fields,  more so on the right side. In image 13 of series 6, there is 2 cm ground-glass density in the anterior left apex. There is moderate right pleural effusion. There is no significant left pleural effusion. There is no pneumothorax. Upper Abdomen: Fatty liver. Musculoskeletal: There are numerous lytic lesions of varying sizes in the left ribs, thoracic spine and lumbar spine. There is decrease in height of bodies of C6 vertebra. There is also decrease in height of bodies of T6, T7, T8 and T9 vertebrae. Review of the MIP images confirms the above findings. IMPRESSION: There is evidence of acute pulmonary embolism with small to moderate thrombus burden. There are no imaging signs of acute right ventricular strain. There is no evidence of thoracic aortic dissection. There is 4.8 cm homogeneous opacity in  the anterior right mid lung fields. This lesion has to be considered primary malignant neoplasm until proven otherwise. There is narrowing of bronchus leading to this parahilar density. PET-CT and biopsy as warranted should be considered. There are numerous lytic lesions of varying sizes along with compression fractures in the cervical and thoracic spine as described in the body of the report consistent with extensive skeletal metastatic disease. Moderate right pleural effusion. There are ground-glass and patchy alveolar infiltrates in both lungs as described in the body of the report suggesting multifocal pneumonia. There is 2.5 cm low-density nodule in the left lobe of thyroid. When the patient's clinical condition permits, thyroid sonogram may be considered. Electronically Signed   By: Elmer Picker M.D.   On: 11/06/2021 15:21   CT ABDOMEN PELVIS W CONTRAST  Result Date: 11/06/2021 CLINICAL DATA:  Abdominal pain, acute, nonlocalized. Status post fall EXAM: CT ABDOMEN AND PELVIS WITH CONTRAST TECHNIQUE: Multidetector CT imaging of the abdomen and pelvis was performed using the standard protocol following bolus  administration of intravenous contrast. RADIATION DOSE REDUCTION: This exam was performed according to the departmental dose-optimization program which includes automated exposure control, adjustment of the mA and/or kV according to patient size and/or use of iterative reconstruction technique. CONTRAST:  11mL OMNIPAQUE IOHEXOL 350 MG/ML SOLN COMPARISON:  None. FINDINGS: Lower chest: Right pleural effusion. Please see separately dictated CT angiography chest 11/06/2021. Ports and Devices: None. Liver: Not enlarged. Several vague hypodense right hepatic lobe lesions measuring 0.9, 1.4, 0.7, 0.5 cm. No laceration or subcapsular hematoma. Biliary System: The gallbladder is otherwise unremarkable with no radio-opaque gallstones. No biliary ductal dilatation. Pancreas: Normal pancreatic contour. No main pancreatic duct dilatation. Spleen: Not enlarged. No focal lesion. No laceration, subcapsular hematoma, or vascular injury. Adrenal Glands: No nodularity bilaterally. Kidneys: Bilateral kidneys enhance symmetrically. No hydronephrosis. Subcentimeter hypodensities are too small characterize. A 1.1 cm vague wedge-shaped hypodensity along the left superior renal pole that likely represents a poorly defined lesion with a density of 45 Hounsfield units. No contusion, laceration, or subcapsular hematoma. No injury to the vascular structures or collecting systems. No hydroureter. The urinary bladder is unremarkable. Bowel: No small or large bowel wall thickening or dilatation. Non-visualization of the appendix. The appendix is not definitely identified with no inflammatory changes in the right lower quadrant to suggest acute appendicitis. Mesentery, Omentum, and Peritoneum: No simple free fluid ascites. No pneumoperitoneum. No hemoperitoneum. No mesenteric hematoma identified. No organized fluid collection. Pelvic Organs: Normal. Lymph Nodes: No abdominal, pelvic, inguinal lymphadenopathy. Vasculature: No abdominal aorta or  iliac aneurysm. No active contrast extravasation or pseudoaneurysm. Musculoskeletal: No significant soft tissue hematoma. No acute pelvic fracture. No spinal fracture. Diffuse appendicular and axial skeleton lytic lesions. IMPRESSION: 1.  No acute traumatic injury to the abdomen, or pelvis. 2. No acute fracture or traumatic malalignment of the lumbar spine. 3. Several vague indeterminate hypodense right hepatic lobe lesions measuring 0.9, 1.4, 0.7, 0.5 cm. Findings could represent metastases. 4. 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 5. An indeterminate 1.1 cm vague slightly wedge-shaped hypodensity along the left superior renal pole. Finding could represent an underlying poorly defined lesion, small infarction, less likely focal pyelonephritis. 6. Please see separately dictated CT angiography chest 11/06/2021. Electronically Signed   By: Iven Finn M.D.   On: 11/06/2021 15:15    EKG: Independently reviewed.   Assessment/Plan Principal Problem:   Pulmonary emboli [facility]) Active Problems:   Acute pulmonary embolism ([place])  (please populate well all problems here in Problem  List. (For example, if patient is on BP meds at home and you resume or decide to hold them, it is a problem that needs to be her. Same for CAD, COPD, HLD and so on)  Submassive PE -Discussed with pulmonology Dr. Raul Del, patient has been taking Eliquis for the past 3 weeks, and the radiology report of PE is acute.  The question is whether this represent therapeutic failure of Eliquis?  Right now, patient is on heparin drip, will discuss with pulm about long-term anticoagulation plan when patient ready to be discharged. -No hypotensive, no significant symptoms signs of right heart heart failure.  Will order echocardiogram to evaluate right heart strain and right-sided function.  Positive troponins -Likely from submassive PE, trend troponins, echocardiogram as above.  Right lung mass with spinal and ribs  metastasis -Stage IV lung cancer by definition, discussed with pulmonology regarding biopsy plan.  Outpatient biopsy is hard to arrange due to patient has to be on continuous anticoagulation. Pulmonology will talk to IR tomorrow for possible rib versus pleural effusion biopsy. -Outpatient PET and oncology follow-up.  Right-sided pleural effusion -Likely malignant from the lung mass on the same side.  DVT -Likely hypercoagulable state and part of the lung cancer, on heparin drip for now.  Chronic peripheral neuropathy -Continue gabapentin  Bipolar disorder -Continue lamotrigine  Severe protein calorie malnutrition -BMI= 18, patient reports recent weight loss, will consult nutrition and start supplement.  DVT prophylaxis: Heparin drip Code Status: Full code Family Communication: Sister at bedside Disposition Plan: Expect more than 2 midnight hospital stay. Consults called: Dr. Raul Del Admission status: Telemetry admission   Lequita Halt MD [facility] Pager [phone]  11/06/2021, 5:22 PM

## 2021-11-07 ENCOUNTER — Inpatient Hospital Stay: Payer: Medicare HMO

## 2021-11-07 ENCOUNTER — Inpatient Hospital Stay
Admit: 2021-11-07 | Discharge: 2021-11-07 | Disposition: A | Discharge status: Home / Self Care | Payer: Medicare HMO | Attending: Internal Medicine | Admitting: Internal Medicine

## 2021-11-07 LAB — LACTATE DEHYDROGENASE, PLEURAL OR PERITONEAL FLUID: LD, Fluid: 329 U/L — ABNORMAL HIGH (ref 3–23)

## 2021-11-07 LAB — BODY FLUID CELL COUNT WITH DIFFERENTIAL
Eos, Fluid: 1 %
Lymphs, Fluid: 75 %
Monocyte-Macrophage-Serous Fluid: 20 %
Neutrophil Count, Fluid: 4 %
Other Cells, Fluid: 0 %
Total Nucleated Cell Count, Fluid: 743 cu mm

## 2021-11-07 LAB — ECHOCARDIOGRAM COMPLETE
AR max vel: 2.69 cm2
AV Area VTI: 2.53 cm2
AV Area mean vel: 2.5 cm2
AV Mean grad: 3 mmHg
AV Peak grad: 4.8 mmHg
Ao pk vel: 1.09 m/s
Area-P 1/2: 3.17 cm2
Height: 65.512 in
MV VTI: 1.83 cm2
S' Lateral: 2.3 cm
Weight: 2010.6 oz

## 2021-11-07 LAB — TROPONIN I (HIGH SENSITIVITY): Troponin I (High Sensitivity): 428 ng/L (ref <18)

## 2021-11-07 LAB — BASIC METABOLIC PANEL
Anion gap: 7 (ref 5–15)
BUN: 15 mg/dL (ref 8–23)
CO2: 25 mmol/L (ref 22–32)
Calcium: 9.2 mg/dL (ref 8.9–10.3)
Chloride: 107 mmol/L (ref 98–111)
Creatinine, Ser: 0.97 mg/dL (ref 0.44–1.00)
GFR, Estimated: >60 mL/min (ref >60)
Glucose, Bld: 96 mg/dL (ref 70–99)
Potassium: 4.2 mmol/L (ref 3.5–5.1)
Sodium: 139 mmol/L (ref 135–145)

## 2021-11-07 LAB — APTT
aPTT: 67 seconds — ABNORMAL HIGH (ref 24–36)
aPTT: 68 seconds — ABNORMAL HIGH (ref 24–36)

## 2021-11-07 LAB — HEPARIN LEVEL (UNFRACTIONATED): Heparin Unfractionated: >1.1 IU/mL — ABNORMAL HIGH (ref 0.30–0.70)

## 2021-11-07 LAB — HIV ANTIBODY (ROUTINE TESTING W REFLEX): HIV Screen 4th Generation wRfx: NONREACTIVE

## 2021-11-07 LAB — PROTEIN, PLEURAL OR PERITONEAL FLUID: Total protein, fluid: 4 g/dL

## 2021-11-07 LAB — GLUCOSE, PLEURAL OR PERITONEAL FLUID: Glucose, Fluid: 104 mg/dL

## 2021-11-07 LAB — AMYLASE, PLEURAL OR PERITONEAL FLUID: Amylase, Fluid: 51 U/L

## 2021-11-07 MED ORDER — OXYCODONE HCL 5 MG PO TABS
5.0000 mg | ORAL_TABLET | ORAL | Status: DC | PRN
  Administered 2021-11-07 – 2021-11-15 (×12): 5 mg via ORAL
  Filled 2021-11-07 (×13): qty 1

## 2021-11-07 MED ORDER — ADULT MULTIVITAMIN W/MINERALS CH
1.0000 | ORAL_TABLET | Freq: Every day | ORAL | Status: DC
  Administered 2021-11-07 – 2021-11-17 (×11): 1 via ORAL
  Filled 2021-11-07 (×11): qty 1

## 2021-11-07 MED ORDER — HYDROMORPHONE HCL 1 MG/ML IJ SOLN
0.5000 mg | INTRAMUSCULAR | Status: DC | PRN
  Administered 2021-11-07 – 2021-11-17 (×23): 0.5 mg via INTRAVENOUS
  Filled 2021-11-07 (×6): qty 1
  Filled 2021-11-07: qty 0.5
  Filled 2021-11-07 (×4): qty 1
  Filled 2021-11-07: qty 0.5
  Filled 2021-11-07 (×3): qty 1
  Filled 2021-11-07: qty 0.5
  Filled 2021-11-07 (×2): qty 1
  Filled 2021-11-07: qty 0.5
  Filled 2021-11-07 (×4): qty 1

## 2021-11-07 NOTE — Progress Notes (Signed)
*  PRELIMINARY RESULTS* Echocardiogram 2D Echocardiogram has been performed.  Cheryl Schroeder 11/07/2021, 9:58 AM

## 2021-11-07 NOTE — Consult Note (Signed)
ANTICOAGULATION CONSULT NOTE - Initial Consult  Pharmacy Consult for Heparin dosing Indication: pulmonary embolus  Allergies  Allergen Reactions   Sulfa Antibiotics Nausea Only and Nausea And Vomiting    Patient Measurements: Height: 5' 5.51" (166.4 cm) Weight: 57 kg (125 lb 10.6 oz) IBW/kg (Calculated) : 58.18 Heparin Dosing Weight: 52.2kg  Vital Signs: Temp: 98.2 F (36.8 C) (02/02 0411) Temp Source: Oral (02/02 0411) BP: 136/79 (02/02 0411) Pulse Rate: 73 (02/02 0411)  Labs: Recent Labs    11/06/21 1257 11/06/21 1533 11/06/21 2301 11/07/21 0412  HGB 12.1  --   --   --   HCT 38.0  --   --   --   PLT 139*  --   --   --   APTT 32  --  60* 67*  LABPROT 16.2*  --   --   --   INR 1.3*  --   --   --   HEPARINUNFRC  --  >1.10*  --  >1.10*  CREATININE 0.85  --   --  0.97  TROPONINIHS 396*  --   --   --      Estimated Creatinine Clearance: 51.3 mL/min (by C-G formula based on SCr of 0.97 mg/dL).   Medical History: Past Medical History:  Diagnosis Date   Bipolar 2 disorder ([place])    Dysplastic nevus    R calf, txted in past, pt states ~2002   Fibromyalgia    Psoriasis     Medications:  Apixaban 5mg  BID (for DVT lower extremity)  Assessment: patient is a 66 year old female recently diagnosed with a DVT about 3 weeks ago who is on Eliquis who presents with some left upper abdominal pain and back pain for several weeks.CT positive for pulmonary emboli.  Goal of Therapy:  Heparin level 0.3-0.7 units/ml aPTT 66-102 seconds Monitor platelets by anticoagulation protocol: Yes   Plan:  2/1:  aPTT @ 2301 = 60, SUBtherapeutic  Will order heparin 750 units IV X 1 bolus and increase drip rate to 1000 units/hr.  Will recheck aPTT/HL 6 hrs after rate change. Will continue to use aPTT to guide dosing until aPTT and HL are therapeutic.   2/2: aPTT @ 0412 = 67,   therapeutic X 1            HL @ 0412 = > 1.10 , elevated        -  aPTT is therapeutic but HL still  elevated.  Will draw confirmation aPTT           in 6 hrs        -  will continue to use aPTT to guide dosing until HL and aPTT correlate  Kipling Graser D 11/07/2021,5:57 AM

## 2021-11-07 NOTE — Plan of Care (Signed)
°  Problem: Clinical Measurements: Goal: Ability to maintain clinical measurements within normal limits will improve Outcome: Progressing Goal: Will remain free from infection Outcome: Progressing Goal: Diagnostic test results will improve Outcome: Progressing Goal: Respiratory complications will improve Outcome: Progressing Goal: Cardiovascular complication will be avoided Outcome: Progressing   Problem: Pain Managment: Goal: General experience of comfort will improve Outcome: Progressing   Pt is involved in and agrees with the plan of care. V/S stable. Complained of pain on her left lower rib cage; Norco and toradol IV given with relief. On Heparin drip. Voiding well.

## 2021-11-07 NOTE — Progress Notes (Signed)
Initial Nutrition Assessment  DOCUMENTATION CODES:  Not applicable  INTERVENTION:  Continue Ensure BID.  Add MVI with minerals daily.  Encourage PO and supplement intake.  NUTRITION DIAGNOSIS:  Inadequate oral intake related to decreased appetite as evidenced by per patient/family report.  GOAL:  Patient will meet greater than or equal to 90% of their needs  MONITOR:  PO intake, Supplement acceptance, Labs, Weight trends, I & O's  REASON FOR ASSESSMENT:  Consult Assessment of nutrition requirement/status  ASSESSMENT:  66 yo female with a PMH of  recently diagnosed left leg DVT 3 weeks ago on Eliquis, fibromyalgia, bipolar disorder, HLD, chronic peripheral neuropathy, presented with worsening of chest pains, and frequent falls. Symptoms started about 3 weeks ago, symptoms almost developed the same time, which includes left leg swelling and pain "tenderness of bilateral rib cages" pleural chest pain worsening with deep breath and cough, and frequent feeling of lightheadedness, and to fall several times last week. Admited with PE.  Spoke with pt at bedside with husband. Pt reports that she has had a decreased appetite for a while, several weeks. She reports that she has been making herself eat, but her appetite has since improved since admission.  She reports losing 30 lbs over the past 10 years, but nothing significant lately.  Per Epic, pt with very limited weight history. Pt appears to have been small for a number of years.  Recommend continuing Ensure BID, as well as adding MVI with minerals daily.  Pt at risk for malnutrition. Important to encourage PO and supplement intake to prevent further depletion.  Supplements: Ensure BID  Medications: reviewed; Protonix  Labs: reviewed  NUTRITION - FOCUSED PHYSICAL EXAM: Flowsheet Row Most Recent Value  Orbital Region No depletion  Upper Arm Region Mild depletion  Thoracic and Lumbar Region No depletion  Buccal Region No  depletion  Temple Region Mild depletion  Clavicle Bone Region Mild depletion  Clavicle and Acromion Bone Region Mild depletion  Scapular Bone Region No depletion  Dorsal Hand Mild depletion  Patellar Region No depletion  Anterior Thigh Region No depletion  Posterior Calf Region Mild depletion  Edema (RD Assessment) None  Hair Reviewed  Eyes Reviewed  Mouth Reviewed  Skin Reviewed  Nails Reviewed   Diet Order:   Diet Order             Diet Heart Room service appropriate? Yes; Fluid consistency: Thin  Diet effective now                  EDUCATION NEEDS:  Education needs have been addressed  Skin:  Skin Assessment: Reviewed RN Assessment  Last BM:  11/05/21  Height:  Ht Readings from Last 1 Encounters:  11/06/21 5' 5.51" (1.664 m)   Weight:  Wt Readings from Last 1 Encounters:  11/07/21 57 kg   BMI:  Body mass index is 20.59 kg/m.  Estimated Nutritional Needs:  Kcal:  1800-2000 Protein:  75-90 grams Fluid:  >1.8 L  Derrel Nip, RD, LDN (she/her/hers) Clinical Inpatient Dietitian RD Pager/After-Hours/Weekend Pager # in [place]

## 2021-11-07 NOTE — Progress Notes (Signed)
PROGRESS NOTE  [name] YWV:[id number] DOB: 04-07-1956 DOA: 11/06/2021 PCP: Lynnell Jude, MD  HPI/Recap of past 24 hours:  Cheryl Schroeder is a 66 y.o. female with medical history significant of recently diagnosed left leg DVT 3 weeks ago on Eliquis, fibromyalgia, bipolar disorder, HLD, chronic peripheral neuropathy, presented with worsening of chest pains, and frequent falls.  Work-up revealed saddle pulmonary embolism and elevated troponin.  Also revealed concern for possible malignancy with metastasis, seen on CT scan.  11/07/2021: Patient was seen and examined at bedside.  She reports left ribs pain worse with taking a deep breath.  Denies hemoptysis.  Assessment/Plan: Principal Problem:   Pulmonary emboli (HCC) Active Problems:   Acute pulmonary embolism (HCC)  Submassive PE -Discussed with pulmonology Dr. Raul Del, patient has been taking Eliquis for the past 3 weeks, and the radiology report of PE is acute.  The question is whether this represent therapeutic failure of Eliquis?  Right now, patient is on heparin drip, will discuss with pulm about long-term anticoagulation plan when patient ready to be discharged. -No hypotensive, no significant symptoms signs of right heart heart failure.  Will order echocardiogram to evaluate right heart strain and right-sided function. Continue heparin drip   Positive troponins -Likely from submassive PE, trend troponins, echocardiogram as above. 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  Outpatient biopsy is hard to arrange due to patient has to be on continuous anticoagulation. Pulmonology will talk to IR tomorrow for possible rib versus pleural effusion biopsy. -Outpatient PET and oncology follow-up.   Right-sided pleural effusion -Likely malignant from the lung mass on the same side. Follow fluid analysis from thoracentesis.    DVT -Likely hypercoagulable state and part of the lung cancer, on heparin drip for now.   Chronic peripheral neuropathy -Continue gabapentin   Bipolar disorder -Continue lamotrigine   Severe protein calorie malnutrition -BMI= 18, patient reports recent weight loss, will consult nutrition and start supplement.   DVT prophylaxis: Heparin drip Code Status: Full code Family Communication: Sister at bedside Disposition Plan: Expect more than 2 midnight hospital stay. Consults called: Dr. Raul Del Admission status: Telemetry admission     Status is: Inpatient Patient requires at least 2 midnights for further evaluation and treatment of the present condition.            Objective: Vitals:   11/07/21 0411 11/07/21 0413 11/07/21 0754 11/07/21 1340  BP: 136/79  124/80 111/60  Pulse: 73  78 94  Resp: 18   20  Temp: 98.2 F (36.8 C)  97.7 F (36.5 C)   TempSrc: Oral  Oral   SpO2: 97%  93% 95%  Weight:  57 kg    Height:        Intake/Output Summary (Last 24 hours) at 11/07/2021 1357 Last data filed at 11/07/2021 0804 Gross per 24 hour  Intake 1325.21 ml  Output --  Net 1325.21 ml   Filed Weights   11/06/21 1138 11/06/21 2040 11/07/21 0413  Weight: 52.2 kg 55.5 kg 57 kg    Exam:  General: 66 y.o. year-old female well developed well nourished in no acute distress.  Alert and oriented x3. Cardiovascular: Regular rate and rhythm with no rubs or gallops.  No thyromegaly or JVD noted.   Respiratory: Clear to auscultation with no wheezes or rales. Good inspiratory effort. Abdomen: Soft nontender nondistended with normal bowel sounds x4 quadrants.  Musculoskeletal: No lower extremity edema. 2/4 pulses in all 4 extremities. Skin: No ulcerative lesions noted or rashes, Psychiatry: Mood is appropriate for condition and setting   Data Reviewed: CBC: Recent Labs  Lab 11/06/21 1257  WBC 9.9  NEUTROABS 7.8*  HGB 12.1  HCT 38.0  MCV 93.4  PLT 322*   Basic Metabolic  Panel: Recent Labs  Lab 11/06/21 1257 11/07/21 0412  NA 137 139  K 3.7 4.2  CL 103 107  CO2 25 25  GLUCOSE 154* 96  BUN 16 15  CREATININE 0.85 0.97  CALCIUM 10.0 9.2   GFR: Estimated Creatinine Clearance: 51.3 mL/min (by C-G formula based on SCr of 0.97 mg/dL). Liver Function Tests: Recent Labs  Lab 11/06/21 1257  AST 34  ALT 20  ALKPHOS 196*  BILITOT 0.8  PROT 8.1  ALBUMIN 4.0   Recent Labs  Lab 11/06/21 1257  LIPASE 35   No results for input(s): AMMONIA in the last 168 hours. Coagulation Profile: Recent Labs  Lab 11/06/21 1257  INR 1.3*   Cardiac Enzymes: No results for input(s): CKTOTAL, CKMB, CKMBINDEX, TROPONINI in the last 168 hours. BNP (last 3 results) No results for input(s): PROBNP in the last 8760 hours. HbA1C: No results for input(s): HGBA1C in the last 72 hours. CBG: No results for input(s): GLUCAP in the last 168 hours. Lipid Profile: No results for input(s): CHOL, HDL, LDLCALC, TRIG, CHOLHDL, LDLDIRECT in the last 72 hours. Thyroid Function Tests: No results for input(s): TSH, T4TOTAL, FREET4, T3FREE, THYROIDAB in the last 72 hours. Anemia Panel: No results for input(s): VITAMINB12, FOLATE, FERRITIN, TIBC, IRON, RETICCTPCT in the last 72 hours. Urine analysis: No results found for: COLORURINE, APPEARANCEUR, LABSPEC, PHURINE, GLUCOSEU, HGBUR, BILIRUBINUR, KETONESUR, PROTEINUR, UROBILINOGEN, NITRITE, LEUKOCYTESUR Sepsis Labs: @LABRCNTIP (procalcitonin:4,lacticidven:4)  ) Recent Results (from the past 240 hour(s))  Resp Panel by RT-PCR (Flu A&B, Covid) Nasopharyngeal Swab     Status: None   Collection Time: 11/06/21  3:49 PM   Specimen: Nasopharyngeal Swab; Nasopharyngeal(NP) swabs in vial transport medium  Result Value Ref Range Status   SARS Coronavirus 2 by RT PCR NEGATIVE NEGATIVE Final    Comment: (NOTE) SARS-CoV-2 target nucleic acids are NOT DETECTED.  The SARS-CoV-2 RNA is generally detectable in upper respiratory specimens  during the acute phase of infection. The lowest concentration of SARS-CoV-2 viral copies this assay can detect is 138 copies/mL. A negative result does not preclude SARS-Cov-2 infection and should not be used as the sole basis for treatment or other patient management decisions. A negative result may occur with  improper specimen collection/handling, submission of specimen other than nasopharyngeal swab, presence of viral mutation(s) within the areas targeted by this assay, and inadequate number of viral copies(<138 copies/mL). A negative result must be combined with clinical observations, patient history, and epidemiological information. The expected result is Negative.  Fact Sheet for Patients:  EntrepreneurPulse.com.au  Fact Sheet for Healthcare Providers:  IncredibleEmployment.be  This test is no t yet approved or cleared by the [place] FDA and  has been authorized for detection and/or diagnosis of SARS-CoV-2 by FDA under an Emergency Use Authorization (EUA). This EUA will remain  in effect (meaning this test can be used) for the duration of the COVID-19 declaration under Section 564(b)(1) of the Act, 21 U.S.C.section 360bbb-3(b)(1), unless the authorization is terminated  or revoked sooner.       Influenza A by PCR NEGATIVE NEGATIVE Final   Influenza B by PCR NEGATIVE NEGATIVE Final    Comment: (  NOTE) The Xpert Xpress SARS-CoV-2/FLU/RSV plus assay is intended as an aid in the diagnosis of influenza from Nasopharyngeal swab specimens and should not be used as a sole basis for treatment. Nasal washings and aspirates are unacceptable for Xpert Xpress SARS-CoV-2/FLU/RSV testing.  Fact Sheet for Patients: EntrepreneurPulse.com.au  Fact Sheet for Healthcare Providers: IncredibleEmployment.be  This test is not yet approved or cleared by the [place] FDA and has been authorized for detection  and/or diagnosis of SARS-CoV-2 by FDA under an Emergency Use Authorization (EUA). This EUA will remain in effect (meaning this test can be used) for the duration of the COVID-19 declaration under Section 564(b)(1) of the Act, 21 U.S.C. section 360bbb-3(b)(1), unless the authorization is terminated or revoked.  Performed at [facility], [address] [place], [place] [address]       Studies: CT Angio Chest PE W and/or Wo Contrast  Result Date: 11/06/2021 CLINICAL DATA:  Chest pain, recent trauma EXAM: CT ANGIOGRAPHY CHEST WITH CONTRAST TECHNIQUE: Multidetector CT imaging of the chest was performed using the standard protocol during bolus administration of intravenous contrast. Multiplanar CT image reconstructions and MIPs were obtained to evaluate the vascular anatomy. RADIATION DOSE REDUCTION: This exam was performed according to the departmental dose-optimization program which includes automated exposure control, adjustment of the mA and/or kV according to patient size and/or use of iterative reconstruction technique. CONTRAST:  116mL OMNIPAQUE IOHEXOL 350 MG/ML SOLN COMPARISON:  Chest radiographs done earlier today FINDINGS: Cardiovascular: There is homogeneous enhancement in the thoracic aorta. There is mild ectasia of main pulmonary artery measuring 3.2 cm. There is saddle embolus in the left main pulmonary artery extending into the left upper lobe and left lower lobe branches. There are few small filling defects in the subsegmental branches in the right middle lobe and right lower lobe. There are no imaging signs of right ventricular strain. Mediastinum/Nodes: No significant lymphadenopathy seen. There is 2.5 cm low-density lesion in the enlarged left lobe of thyroid. Lungs/Pleura: There is moderate right pleural effusion. There are small patchy infiltrates in the medial aspect of right upper lobe in the right upper lung fields. There is 5.2 x 4.1 cm homogeneous density inseparable  from interlobar fissure in the anterior right parahilar region. There is narrowing of the lumen of the bronchus leading to this homogeneous opacity in the anteromedial right mid lung fields. Linear patchy infiltrates are seen in both lower lung fields, more so on the right side. In image 13 of series 6, there is 2 cm ground-glass density in the anterior left apex. There is moderate right pleural effusion. There is no significant left pleural effusion. There is no pneumothorax. Upper Abdomen: Fatty liver. Musculoskeletal: There are numerous lytic lesions of varying sizes in the left ribs, thoracic spine and lumbar spine. There is decrease in height of bodies of C6 vertebra. There is also decrease in height of bodies of T6, T7, T8 and T9 vertebrae. Review of the MIP images confirms the above findings. IMPRESSION: There is evidence of acute pulmonary embolism with small to moderate thrombus burden. There are no imaging signs of acute right ventricular strain. There is no evidence of thoracic aortic dissection. There is 4.8 cm homogeneous opacity in the anterior right mid lung fields. This lesion has to be considered primary malignant neoplasm until proven otherwise. There is narrowing of bronchus leading to this parahilar density. PET-CT and biopsy as warranted should be considered. There are numerous lytic lesions of varying sizes along with compression fractures in the  cervical and thoracic spine as described in the body of the report consistent with extensive skeletal metastatic disease. Moderate right pleural effusion. There are ground-glass and patchy alveolar infiltrates in both lungs as described in the body of the report suggesting multifocal pneumonia. There is 2.5 cm low-density nodule in the left lobe of thyroid. When the patient's clinical condition permits, thyroid sonogram may be considered. Electronically Signed   By: Elmer Picker M.D.   On: 11/06/2021 15:21   CT ABDOMEN PELVIS W  CONTRAST  Result Date: 11/06/2021 CLINICAL DATA:  Abdominal pain, acute, nonlocalized. Status post fall EXAM: CT ABDOMEN AND PELVIS WITH CONTRAST TECHNIQUE: Multidetector CT imaging of the abdomen and pelvis was performed using the standard protocol following bolus administration of intravenous contrast. RADIATION DOSE REDUCTION: This exam was performed according to the departmental dose-optimization program which includes automated exposure control, adjustment of the mA and/or kV according to patient size and/or use of iterative reconstruction technique. CONTRAST:  156mL OMNIPAQUE IOHEXOL 350 MG/ML SOLN COMPARISON:  None. FINDINGS: Lower chest: Right pleural effusion. Please see separately dictated CT angiography chest 11/06/2021. Ports and Devices: None. Liver: Not enlarged. Several vague hypodense right hepatic lobe lesions measuring 0.9, 1.4, 0.7, 0.5 cm. No laceration or subcapsular hematoma. Biliary System: The gallbladder is otherwise unremarkable with no radio-opaque gallstones. No biliary ductal dilatation. Pancreas: Normal pancreatic contour. No main pancreatic duct dilatation. Spleen: Not enlarged. No focal lesion. No laceration, subcapsular hematoma, or vascular injury. Adrenal Glands: No nodularity bilaterally. Kidneys: Bilateral kidneys enhance symmetrically. No hydronephrosis. Subcentimeter hypodensities are too small characterize. A 1.1 cm vague wedge-shaped hypodensity along the left superior renal pole that likely represents a poorly defined lesion with a density of 45 Hounsfield units. No contusion, laceration, or subcapsular hematoma. No injury to the vascular structures or collecting systems. No hydroureter. The urinary bladder is unremarkable. Bowel: No small or large bowel wall thickening or dilatation. Non-visualization of the appendix. The appendix is not definitely identified with no inflammatory changes in the right lower quadrant to suggest acute appendicitis. Mesentery, Omentum, and  Peritoneum: No simple free fluid ascites. No pneumoperitoneum. No hemoperitoneum. No mesenteric hematoma identified. No organized fluid collection. Pelvic Organs: Normal. Lymph Nodes: No abdominal, pelvic, inguinal lymphadenopathy. Vasculature: No abdominal aorta or iliac aneurysm. No active contrast extravasation or pseudoaneurysm. Musculoskeletal: No significant soft tissue hematoma. No acute pelvic fracture. No spinal fracture. Diffuse appendicular and axial skeleton lytic lesions. IMPRESSION: 1.  No acute traumatic injury to the abdomen, or pelvis. 2. No acute fracture or traumatic malalignment of the lumbar spine. 3. Several vague indeterminate hypodense right hepatic lobe lesions measuring 0.9, 1.4, 0.7, 0.5 cm. Findings could represent metastases. 4. 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 5. An indeterminate 1.1 cm vague slightly wedge-shaped hypodensity along the left superior renal pole. Finding could represent an underlying poorly defined lesion, small infarction, less likely focal pyelonephritis. 6. Please see separately dictated CT angiography chest 11/06/2021. Electronically Signed   By: Iven Finn M.D.   On: 11/06/2021 15:15   ECHOCARDIOGRAM COMPLETE  Result Date: 11/07/2021    ECHOCARDIOGRAM REPORT   Patient Name:   AQUARIUS TREMPER Date of Exam: 11/07/2021 Medical Rec #:  [id number]      Height:       65.5 in Accession #:    [id number]     Weight:       125.7 lb Date of Birth:  [birth year]      BSA:  1.633 m Patient Age:    31 years       BP:           124/80 mmHg Patient Gender: F              HR:           78 bpm. Exam Location:  [facility] Procedure: 2D Echo, Cardiac Doppler and Color Doppler Indications:     Pulmonary Embolism I26.09  History:         Patient has no prior history of Echocardiogram examinations.                  Pulmonary emboli, acute pulmonary embolism.  Sonographer:     Sherrie Sport Referring Phys:  [id number] Lequita Halt Diagnosing Phys: Donnelly Angelica  IMPRESSIONS  1. Left ventricular ejection fraction, by estimation, is 60 to 65%. The left ventricle has normal function. The left ventricle has no regional wall motion abnormalities. Left ventricular diastolic parameters are consistent with Grade I diastolic dysfunction (impaired relaxation).  2. Right ventricular systolic function is normal. The right ventricular size is mildly enlarged. There is mildly elevated pulmonary artery systolic pressure. The estimated right ventricular systolic pressure is 45.4 mmHg.  3. Focal thickening of MV leaflet. Correlate clinically. . The mitral valve is degenerative. Mild mitral valve regurgitation. No evidence of mitral stenosis.  4. The aortic valve is normal in structure. Aortic valve regurgitation is not visualized. No aortic stenosis is present.  5. The inferior vena cava is normal in size with <50% respiratory variability, suggesting right atrial pressure of 8 mmHg. FINDINGS  Left Ventricle: Left ventricular ejection fraction, by estimation, is 60 to 65%. The left ventricle has normal function. The left ventricle has no regional wall motion abnormalities. The left ventricular internal cavity size was normal in size. There is  no left ventricular hypertrophy. Left ventricular diastolic parameters are consistent with Grade I diastolic dysfunction (impaired relaxation). Right Ventricle: The right ventricular size is mildly enlarged. No increase in right ventricular wall thickness. Right ventricular systolic function is normal. There is mildly elevated pulmonary artery systolic pressure. The tricuspid regurgitant velocity is 2.81 m/s, and with an assumed right atrial pressure of 8 mmHg, the estimated right ventricular systolic pressure is 09.8 mmHg. Left Atrium: Left atrial size was normal in size. Right Atrium: Right atrial size was normal in size. Pericardium: There is no evidence of pericardial effusion. Mitral Valve: Focal thickening of MV leaflet. Correlate clinically. The  mitral valve is degenerative in appearance. Mild mitral valve regurgitation. No evidence of mitral valve stenosis. MV peak gradient, 6.7 mmHg. The mean mitral valve gradient is 3.0 mmHg. Tricuspid Valve: The tricuspid valve is normal in structure. Tricuspid valve regurgitation is mild . No evidence of tricuspid stenosis. Aortic Valve: The aortic valve is normal in structure. Aortic valve regurgitation is not visualized. No aortic stenosis is present. Aortic valve mean gradient measures 3.0 mmHg. Aortic valve peak gradient measures 4.8 mmHg. Aortic valve area, by VTI measures 2.53 cm. Pulmonic Valve: The pulmonic valve was normal in structure. Pulmonic valve regurgitation is not visualized. No evidence of pulmonic stenosis. Aorta: The aortic root is normal in size and structure. Venous: The inferior vena cava is normal in size with less than 50% respiratory variability, suggesting right atrial pressure of 8 mmHg. IAS/Shunts: No atrial level shunt detected by color flow Doppler.  LEFT VENTRICLE PLAX 2D LVIDd:         3.70 cm   Diastology LVIDs:  2.30 cm   LV e' medial:    5.11 cm/s LV PW:         1.40 cm   LV E/e' medial:  15.9 LV IVS:        1.10 cm   LV e' lateral:   4.35 cm/s LVOT diam:     2.00 cm   LV E/e' lateral: 18.7 LV SV:         52 LV SV Index:   32 LVOT Area:     3.14 cm  RIGHT VENTRICLE RV Basal diam:  2.90 cm RV S prime:     13.10 cm/s TAPSE (M-mode): 2.1 cm LEFT ATRIUM             Index        RIGHT ATRIUM           Index LA diam:        2.50 cm 1.53 cm/m   RA Area:     14.40 cm LA Vol (A2C):   55.7 ml 34.11 ml/m  RA Volume:   34.30 ml  21.01 ml/m LA Vol (A4C):   50.6 ml 30.99 ml/m LA Biplane Vol: 53.7 ml 32.89 ml/m  AORTIC VALVE                    PULMONIC VALVE AV Area (Vmax):    2.69 cm     PV Vmax:        0.70 m/s AV Area (Vmean):   2.50 cm     PV Vmean:       47.133 cm/s AV Area (VTI):     2.53 cm     PV VTI:         0.128 m AV Vmax:           109.00 cm/s  PV Peak grad:   1.9 mmHg  AV Vmean:          74.700 cm/s  PV Mean grad:   1.0 mmHg AV VTI:            0.206 m      RVOT Peak grad: 3 mmHg AV Peak Grad:      4.8 mmHg AV Mean Grad:      3.0 mmHg LVOT Vmax:         93.30 cm/s LVOT Vmean:        59.400 cm/s LVOT VTI:          0.166 m LVOT/AV VTI ratio: 0.81  AORTA Ao Root diam: 3.17 cm MITRAL VALVE                TRICUSPID VALVE MV Area (PHT): 3.17 cm     TR Peak grad:   31.6 mmHg MV Area VTI:   1.83 cm     TR Vmax:        281.00 cm/s MV Peak grad:  6.7 mmHg MV Mean grad:  3.0 mmHg     SHUNTS MV Vmax:       1.29 m/s     Systemic VTI:  0.17 m MV Vmean:      75.9 cm/s    Systemic Diam: 2.00 cm MV Decel Time: 239 msec     Pulmonic VTI:  0.158 m MV E velocity: 81.40 cm/s MV A velocity: 109.00 cm/s MV E/A ratio:  0.75 Donnelly Angelica Electronically signed by Donnelly Angelica Signature Date/Time: 11/07/2021/1:06:16 PM    Final     Scheduled Meds:  feeding supplement  237 mL Oral  BID BM   FLUoxetine  40 mg Oral Daily   fluticasone  1 spray Each Nare Daily   gabapentin  300 mg Oral TID   lamoTRIgine  200 mg Oral Daily   multivitamin with minerals  1 tablet Oral Daily   pantoprazole  40 mg Oral Daily   pravastatin  40 mg Oral QHS   sodium chloride flush  3 mL Intravenous Q12H    Continuous Infusions:  sodium chloride     heparin 1,000 Units/hr (11/06/21 2338)     LOS: 1 day     Kayleen Memos, MD [facility] Pager [phone]  If 7PM-7AM, please contact night-coverage [email or web address] Password [facility] 11/07/2021, 1:57 PM

## 2021-11-07 NOTE — TOC Initial Note (Signed)
Transition of Care [facility]) - Initial/Assessment Note    Patient Details  Name: Cheryl Schroeder MRN: [id number] Date of Birth: 1956-03-26  Transition of Care [facility]) CM/SW Contact:    Beverly Sessions, RN Phone Number: 11/07/2021, 3:31 PM  Clinical Narrative:                   Transition of Care [facility]) Screening Note   Patient Details  Name: Cheryl Schroeder Date of Birth: 1955-12-28   Transition of Care [facility]) CM/SW Contact:    Beverly Sessions, RN Phone Number: 11/07/2021, 3:31 PM    Transition of Care Department [facility]) has reviewed patient and no TOC needs have been identified at this time. We will continue to monitor patient advancement through interdisciplinary progression rounds. If new patient transition needs arise, please place a TOC consult.  Requested nursing staff to ambulated patient to determine if PT eval required.  Patient was on Eliquis prior to admission        Patient Goals and CMS Choice        Expected Discharge Plan and Services                                                Prior Living Arrangements/Services                       Activities of Daily Living Home Assistive Devices/Equipment: Eyeglasses ADL Screening (condition at time of admission) Patient's cognitive ability adequate to safely complete daily activities?: Yes Is the patient deaf or have difficulty hearing?: No Does the patient have difficulty seeing, even when wearing glasses/contacts?: No Does the patient have difficulty concentrating, remembering, or making decisions?: No Patient able to express need for assistance with ADLs?: Yes Does the patient have difficulty dressing or bathing?: No Independently performs ADLs?: Yes (appropriate for developmental age) Does the patient have difficulty walking or climbing stairs?: No Weakness of Legs: Both Weakness of Arms/Hands: None  Permission Sought/Granted                  Emotional Assessment               Admission diagnosis:  Epigastric pain [R10.13] Pulmonary emboli [facility]) [I26.99] Patient Active Problem List   Diagnosis Date Noted   Acute pulmonary embolism ([place]) 11/06/2021   Pulmonary emboli ([place]) 11/06/2021   Plantar fasciitis 07/09/2014   PCP:  Lynnell Jude, MD Pharmacy:   [id number] - [place], [place] [address] [place] [address] Phone: [phone] Fax: [place], [place] [address] [place] [address] Phone: [phone] Fax: [phone]     Social Determinants of Health (SDOH) Interventions    Readmission Risk Interventions No flowsheet data found.

## 2021-11-07 NOTE — Procedures (Signed)
Interventional Radiology Procedure:   Indications: Right lung mass and right pleural effusion  Procedure: US guided right thoracentesis  Findings: Removed 350 ml of amber colored fluid from right pleural space.   Complications: No immediate complications noted.     EBL: Minimal  Plan: F/U CXR   Deronte Solis R. Anselm Pancoast, MD  Pager: [phone]

## 2021-11-07 NOTE — Consult Note (Signed)
Pulmonary Medicine          Date: 11/07/2021,   MRN# [id number] Cheryl Schroeder 04/23/1956     AdmissionWeight: 52.2 kg                 CurrentWeight: 57 kg      CHIEF COMPLAINT:   CC: PE/LUNG MASS WITH METS   HISTORY OF PRESENT ILLNESS   This is 66 yr old lady, fibromyalgia, bipolar disorder, HLD, chronic peripheral neuropathya nurse, who presents with dizziness and falling, preceding hx of dvt, leg swelling, treated with eliquis.3 weeks ago. Pulmonary wise on w/u thus far she has lung mass, residual femoral clot, pulmonary embolism, right pleural effusion and lesions in ribs, spine and liver.      PAST MEDICAL HISTORY   Past Medical History:  Diagnosis Date   Bipolar 2 disorder ([place])    Dysplastic nevus    R calf, txted in past, pt states ~2002   Fibromyalgia    Psoriasis      SURGICAL HISTORY   Past Surgical History:  Procedure Laterality Date   ABDOMINAL HYSTERECTOMY     ADENOIDECTOMY     BREAST BIOPSY Right 1990   cyst   TONSILLECTOMY       FAMILY HISTORY   Family History  Problem Relation Age of Onset   Breast cancer Maternal Aunt 72   Lung cancer Father      SOCIAL HISTORY   Social History   Tobacco Use   Smoking status: Never   Smokeless tobacco: Never  Vaping Use   Vaping Use: Never used  Substance Use Topics   Alcohol use: No   Drug use: No     MEDICATIONS    Home Medication:    Current Medication:  Current Facility-Administered Medications:    0.9 %  sodium chloride infusion, 250 mL, Intravenous, PRN, Lequita Halt, MD   acetaminophen (TYLENOL) tablet 650 mg, 650 mg, Oral, Q4H PRN, Wynetta Fines T, MD, 650 mg at 11/06/21 2019   ALPRAZolam (XANAX) tablet 0.5-1 mg, 0.5-1 mg, Oral, QHS PRN, Lequita Halt, MD   feeding supplement (ENSURE ENLIVE / ENSURE PLUS) liquid 237 mL, 237 mL, Oral, BID BM, Wynetta Fines T, MD, 237 mL at 11/07/21 0916   FLUoxetine (PROZAC) capsule 40 mg, 40 mg, Oral, Daily, Wynetta Fines T, MD, 40 mg  at 11/07/21 0916   fluticasone (FLONASE) 50 MCG/ACT nasal spray 1 spray, 1 spray, Each Nare, Daily, Wynetta Fines T, MD   gabapentin (NEURONTIN) capsule 300 mg, 300 mg, Oral, TID, Wynetta Fines T, MD, 300 mg at 11/07/21 0916   heparin ADULT infusion 100 units/mL (25000 units/221mL), 1,000 Units/hr, Intravenous, Continuous, Wynetta Fines T, MD, Last Rate: 10 mL/hr at 11/06/21 2338, 1,000 Units/hr at 11/06/21 2338   HYDROcodone-acetaminophen (NORCO/VICODIN) 5-325 MG per tablet 1 tablet, 1 tablet, Oral, Q4H PRN, Wynetta Fines T, MD, 1 tablet at 11/06/21 2153   HYDROmorphone (DILAUDID) injection 0.5 mg, 0.5 mg, Intravenous, Q4H PRN, [place] Crane, Carole N, DO   lamoTRIgine (LAMICTAL) tablet 200 mg, 200 mg, Oral, Daily, [place], Ping T, MD, 200 mg at 11/07/21 0916   ondansetron (ZOFRAN) injection 4 mg, 4 mg, Intravenous, Q6H PRN, Wynetta Fines T, MD   oxyCODONE (Oxy IR/ROXICODONE) immediate release tablet 5 mg, 5 mg, Oral, Q4H PRN, Hall, Carole N, DO   pantoprazole (PROTONIX) EC tablet 40 mg, 40 mg, Oral, Daily, Wynetta Fines T, MD, 40 mg at 11/07/21 0916   pravastatin (PRAVACHOL) tablet 40  mg, 40 mg, Oral, QHS, Wynetta Fines T, MD, 40 mg at 11/06/21 2105   sodium chloride flush (NS) 0.9 % injection 3 mL, 3 mL, Intravenous, Q12H, Wynetta Fines T, MD, 3 mL at 11/07/21 4174   sodium chloride flush (NS) 0.9 % injection 3 mL, 3 mL, Intravenous, PRN, Lequita Halt, MD    ALLERGIES   Sulfa antibiotics     REVIEW OF SYSTEMS    Review of Systems:  Gen:  Denies  fever, sweats, chills weigh loss  HEENT: Denies blurred vision, double vision, ear pain, eye pain, hearing loss, nose bleeds, sore throat Cardiac:  No dizziness, chest pain or heaviness, chest tightness,edema Resp:   Denies cough or sputum porduction, shortness of breath,wheezing, hemoptysis,  Gi: Denies swallowing difficulty, ++stomach pain, no nausea or vomiting, diarrhea, constipation, bowel incontinence Gu:  Denies bladder incontinence, burning urine Ext:    lower rib pain. Denies Joint pain, stiffness or swelling Skin: Denies  skin rash, easy bruising or bleeding or hives Endoc:  Denies polyuria, polydipsia , polyphagia or weight change Psych:   Denies depression, insomnia or hallucinations  BACK: BACK PAIN  Other:  All other systems negative   VS: BP 124/80 (BP Location: Left Arm)    Pulse 78    Temp 97.7 F (36.5 C) (Oral)    Resp 18    Ht 5' 5.51" (1.664 m)    Wt 57 kg    SpO2 93%    BMI 20.59 kg/m      PHYSICAL EXAM    GENERAL:NAD, no fevers, chills, no weakness no fatigue, pleasant, spiritual. Had prayer with her HEAD: Normocephalic, atraumatic.  EYES: Pupils equal, round, reactive to light. Extraocular muscles intact. No scleral icterus.  MOUTH: Moist mucosal membrane. Dentition intact. No abscess noted.  EAR, NOSE, THROAT: Clear without exudates. No external lesions.  NECK: Supple. No thyromegaly. No nodules. No JVD.  PULMONARY: no rub, no wheezing CARDIOVASCULAR: S1 and S2. Regular rate and rhythm. No murmurs, rubs, or gallops. No edema. Pedal pulses 2+ bilaterally.  GASTROINTESTINAL: Soft, nontender, nondistended. No masses. Positive bowel sounds. No hepatosplenomegaly.  MUSCULOSKELETAL: No swelling, clubbing, or edema. Range of motion full in all extremities.  NEUROLOGIC: Cranial nerves II through XII are intact. No gross focal neurological deficits. Sensation intact. Reflexes intact.  SKIN: No ulceration, lesions, rashes, or cyanosis. Skin warm and dry. Turgor intact.  PSYCHIATRIC: Mood, affect within normal limits. The patient is awake, alert and oriented x 3. Insight, judgment intact.       IMAGING    DG Chest 2 View  Result Date: 10/22/2021 CLINICAL DATA:  Contusion of the rib on the left side. Left rib pain. EXAM: CHEST - 2 VIEW COMPARISON:  None. FINDINGS: The heart size and mediastinal contours are within normal limits. Soft tissue density mass with area of atelectasis in the right mid lung measuring  approximately 3.0 x 4.9 cm. Bibasilar atelectasis. Hyperinflated lungs. No pleural effusion or pneumothorax. The visualized skeletal structures are unremarkable. IMPRESSION: 1. Soft tissue density mass in the right mid lung measuring at least 3.0 x 4.9 cm, it may represent pneumonia, loculated pleural effusion or pulmonary mass. Further evaluation with CT examination would be helpful. Follow-up to resolution is recommended. 2. Hyperinflated lungs concerning for COPD. Bibasilar atelectasis. No pleural effusion or pneumothorax. An attempt was made to reach out to the provider Dr. Clemmie Krill without response from our office at the time of dictation. Electronically Signed   By: Judye Bos.O.  On: 10/22/2021 16:07   DG Ribs Unilateral W/Chest Left  Result Date: 11/06/2021 CLINICAL DATA:  Fall, left rib pain EXAM: LEFT RIBS AND CHEST - 3+ VIEW COMPARISON:  Chest x-ray 10/22/2021 FINDINGS: No acute fracture identified in the left ribs. Cardiomediastinal silhouette is unchanged. Persistent ovoid masslike opacity adjacent to the fissure in the right lung measuring approximately 5 x 3.2 cm, grossly unchanged. Linear opacities in the left lower lung zone likely represent subsegmental atelectasis. Hyperinflated lungs with mildly prominent interstitial lung markings. No pleural effusion or pneumothorax visualized. IMPRESSION: 1. No left rib fractures identified. 2. Persistent and unchanged ovoid perifissural masslike opacity in the right lung. 3. COPD. Electronically Signed   By: Ofilia Neas M.D.   On: 11/06/2021 12:16   CT Angio Chest PE W and/or Wo Contrast  Result Date: 11/06/2021 CLINICAL DATA:  Chest pain, recent trauma EXAM: CT ANGIOGRAPHY CHEST WITH CONTRAST TECHNIQUE: Multidetector CT imaging of the chest was performed using the standard protocol during bolus administration of intravenous contrast. Multiplanar CT image reconstructions and MIPs were obtained to evaluate the vascular anatomy. RADIATION DOSE  REDUCTION: This exam was performed according to the departmental dose-optimization program which includes automated exposure control, adjustment of the mA and/or kV according to patient size and/or use of iterative reconstruction technique. CONTRAST:  154mL OMNIPAQUE IOHEXOL 350 MG/ML SOLN COMPARISON:  Chest radiographs done earlier today FINDINGS: Cardiovascular: There is homogeneous enhancement in the thoracic aorta. There is mild ectasia of main pulmonary artery measuring 3.2 cm. There is saddle embolus in the left main pulmonary artery extending into the left upper lobe and left lower lobe branches. There are few small filling defects in the subsegmental branches in the right middle lobe and right lower lobe. There are no imaging signs of right ventricular strain. Mediastinum/Nodes: No significant lymphadenopathy seen. There is 2.5 cm low-density lesion in the enlarged left lobe of thyroid. Lungs/Pleura: There is moderate right pleural effusion. There are small patchy infiltrates in the medial aspect of right upper lobe in the right upper lung fields. There is 5.2 x 4.1 cm homogeneous density inseparable from interlobar fissure in the anterior right parahilar region. There is narrowing of the lumen of the bronchus leading to this homogeneous opacity in the anteromedial right mid lung fields. Linear patchy infiltrates are seen in both lower lung fields, more so on the right side. In image 13 of series 6, there is 2 cm ground-glass density in the anterior left apex. There is moderate right pleural effusion. There is no significant left pleural effusion. There is no pneumothorax. Upper Abdomen: Fatty liver. Musculoskeletal: There are numerous lytic lesions of varying sizes in the left ribs, thoracic spine and lumbar spine. There is decrease in height of bodies of C6 vertebra. There is also decrease in height of bodies of T6, T7, T8 and T9 vertebrae. Review of the MIP images confirms the above findings. IMPRESSION:  There is evidence of acute pulmonary embolism with small to moderate thrombus burden. There are no imaging signs of acute right ventricular strain. There is no evidence of thoracic aortic dissection. There is 4.8 cm homogeneous opacity in the anterior right mid lung fields. This lesion has to be considered primary malignant neoplasm until proven otherwise. There is narrowing of bronchus leading to this parahilar density. PET-CT and biopsy as warranted should be considered. There are numerous lytic lesions of varying sizes along with compression fractures in the cervical and thoracic spine as described in the body of the report consistent with  extensive skeletal metastatic disease. Moderate right pleural effusion. There are ground-glass and patchy alveolar infiltrates in both lungs as described in the body of the report suggesting multifocal pneumonia. There is 2.5 cm low-density nodule in the left lobe of thyroid. When the patient's clinical condition permits, thyroid sonogram may be considered. Electronically Signed   By: Elmer Picker M.D.   On: 11/06/2021 15:21   CT ABDOMEN PELVIS W CONTRAST  Result Date: 11/06/2021 CLINICAL DATA:  Abdominal pain, acute, nonlocalized. Status post fall EXAM: CT ABDOMEN AND PELVIS WITH CONTRAST TECHNIQUE: Multidetector CT imaging of the abdomen and pelvis was performed using the standard protocol following bolus administration of intravenous contrast. RADIATION DOSE REDUCTION: This exam was performed according to the departmental dose-optimization program which includes automated exposure control, adjustment of the mA and/or kV according to patient size and/or use of iterative reconstruction technique. CONTRAST:  179mL OMNIPAQUE IOHEXOL 350 MG/ML SOLN COMPARISON:  None. FINDINGS: Lower chest: Right pleural effusion. Please see separately dictated CT angiography chest 11/06/2021. Ports and Devices: None. Liver: Not enlarged. Several vague hypodense right hepatic lobe  lesions measuring 0.9, 1.4, 0.7, 0.5 cm. No laceration or subcapsular hematoma. Biliary System: The gallbladder is otherwise unremarkable with no radio-opaque gallstones. No biliary ductal dilatation. Pancreas: Normal pancreatic contour. No main pancreatic duct dilatation. Spleen: Not enlarged. No focal lesion. No laceration, subcapsular hematoma, or vascular injury. Adrenal Glands: No nodularity bilaterally. Kidneys: Bilateral kidneys enhance symmetrically. No hydronephrosis. Subcentimeter hypodensities are too small characterize. A 1.1 cm vague wedge-shaped hypodensity along the left superior renal pole that likely represents a poorly defined lesion with a density of 45 Hounsfield units. No contusion, laceration, or subcapsular hematoma. No injury to the vascular structures or collecting systems. No hydroureter. The urinary bladder is unremarkable. Bowel: No small or large bowel wall thickening or dilatation. Non-visualization of the appendix. The appendix is not definitely identified with no inflammatory changes in the right lower quadrant to suggest acute appendicitis. Mesentery, Omentum, and Peritoneum: No simple free fluid ascites. No pneumoperitoneum. No hemoperitoneum. No mesenteric hematoma identified. No organized fluid collection. Pelvic Organs: Normal. Lymph Nodes: No abdominal, pelvic, inguinal lymphadenopathy. Vasculature: No abdominal aorta or iliac aneurysm. No active contrast extravasation or pseudoaneurysm. Musculoskeletal: No significant soft tissue hematoma. No acute pelvic fracture. No spinal fracture. Diffuse appendicular and axial skeleton lytic lesions. IMPRESSION: 1.  No acute traumatic injury to the abdomen, or pelvis. 2. No acute fracture or traumatic malalignment of the lumbar spine. 3. Several vague indeterminate hypodense right hepatic lobe lesions measuring 0.9, 1.4, 0.7, 0.5 cm. Findings could represent metastases. 4. 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 5. An  indeterminate 1.1 cm vague slightly wedge-shaped hypodensity along the left superior renal pole. Finding could represent an underlying poorly defined lesion, small infarction, less likely focal pyelonephritis. 6. Please see separately dictated CT angiography chest 11/06/2021. Electronically Signed   By: Iven Finn M.D.   On: 11/06/2021 15:15   US Venous Img Lower Unilateral Left (DVT)  Result Date: 10/08/2021 CLINICAL DATA:  Left lower extremity pain and swelling EXAM: Left LOWER EXTREMITY VENOUS DOPPLER ULTRASOUND TECHNIQUE: Gray-scale sonography with graded compression, as well as color Doppler and duplex ultrasound were performed to evaluate the lower extremity deep venous systems from the level of the common femoral vein and including the common femoral, femoral, profunda femoral, popliteal and calf veins including the posterior tibial, peroneal and gastrocnemius veins when visible. The superficial great saphenous vein was also interrogated. Spectral Doppler was utilized to evaluate  flow at rest and with distal augmentation maneuvers in the common femoral, femoral and popliteal veins. COMPARISON:  None. FINDINGS: Contralateral Common Femoral Vein: Respiratory phasicity is normal and symmetric with the symptomatic side. No evidence of thrombus. Normal compressibility. Common Femoral Vein: No evidence of thrombus. Normal compressibility, respiratory phasicity and response to augmentation. Saphenofemoral Junction: No evidence of thrombus. Normal compressibility and flow on color Doppler imaging. Profunda Femoral Vein: No evidence of thrombus. Normal compressibility and flow on color Doppler imaging. Femoral Vein: The proximal and mid femoral vein is normal. There is occlusive thrombus with noncompressibility in the distal femoral vein. Popliteal Vein: There is occlusive thrombus with noncompressibility in the popliteal vein. Calf Veins: Occlusive thrombus is seen in the posterior tibial and peroneal vein  with noncompressibility. Superficial Great Saphenous Vein: No evidence of thrombus. Normal compressibility. Venous Reflux:  None. Other Findings:  None. IMPRESSION: Occlusive thrombus in the distal femoral vein through the calf veins. These results will be called to the ordering clinician or representative by the Radiology Department at the imaging location. Electronically Signed   By: Valetta Mole M.D.   On: 10/08/2021 16:09      ASSESSMENT/PLAN  This is a pleasant, unfortunate lady of faith who presents with known dvt, sob, lower chest pain was placed on eliqius, 23 weeks ago. She  came in yesterday with dizziness and falling. 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   Presently not having respiratory distress. Not on oxygen, vital signs stable, no hemoptysis. On iv heparin  It would not be unreasonable to suspect the pe was probable there at the dx of dvt . She her dyspnea is not much worse x 3 weeks.   Malignacy w/u -oncology consult -pet scan -right thoracentesis, if negative ( hopefully today) -pursue tissue  dx ( bone, liver  vs lung bx, she want to limit invasive procedures) - echo -head mri  Pulmonary/right dvt/common femoral vein cloty. Most likely due to hypercoagulable state from what looks like malignancy -switch to eliquis on discharge ( after thoracentesis, if enough fluid id there) -discussed filter but do not think is neccssary at this time.      Thank you for allowing me to participate in the care of this patient.   Patient/Family are satisfied with care plan and all questions have been answered.  This document was prepared using Dragon voice recognition software and may include unintentional dictation errors.     Wallene Huh, M.D.  Division of [facility]

## 2021-11-08 ENCOUNTER — Ambulatory Visit: Payer: Medicare HMO | Attending: Family Medicine

## 2021-11-08 ENCOUNTER — Encounter
Admission: EM | Disposition: A | Discharge status: Hospice | Payer: Self-pay | Source: Home / Self Care | Attending: Internal Medicine

## 2021-11-08 DIAGNOSIS — R918 Other nonspecific abnormal finding of lung field: Secondary | ICD-10-CM

## 2021-11-08 DIAGNOSIS — I2782 Chronic pulmonary embolism: Secondary | ICD-10-CM

## 2021-11-08 DIAGNOSIS — J90 Pleural effusion, not elsewhere classified: Secondary | ICD-10-CM

## 2021-11-08 DIAGNOSIS — J910 Malignant pleural effusion: Secondary | ICD-10-CM | POA: Insufficient documentation

## 2021-11-08 DIAGNOSIS — M899 Disorder of bone, unspecified: Secondary | ICD-10-CM

## 2021-11-08 DIAGNOSIS — K769 Liver disease, unspecified: Secondary | ICD-10-CM

## 2021-11-08 DIAGNOSIS — I2692 Saddle embolus of pulmonary artery without acute cor pulmonale: Principal | ICD-10-CM

## 2021-11-08 HISTORY — PX: PULMONARY THROMBECTOMY: CATH118295

## 2021-11-08 LAB — BASIC METABOLIC PANEL
Anion gap: 7 (ref 5–15)
BUN: 12 mg/dL (ref 8–23)
CO2: 27 mmol/L (ref 22–32)
Calcium: 9.4 mg/dL (ref 8.9–10.3)
Chloride: 104 mmol/L (ref 98–111)
Creatinine, Ser: 0.89 mg/dL (ref 0.44–1.00)
GFR, Estimated: >60 mL/min (ref >60)
Glucose, Bld: 92 mg/dL (ref 70–99)
Potassium: 4.2 mmol/L (ref 3.5–5.1)
Sodium: 138 mmol/L (ref 135–145)

## 2021-11-08 LAB — BRAIN NATRIURETIC PEPTIDE: B Natriuretic Peptide: 27.3 pg/mL (ref 0.0–100.0)

## 2021-11-08 LAB — HEPARIN LEVEL (UNFRACTIONATED): Heparin Unfractionated: 0.6 IU/mL (ref 0.30–0.70)

## 2021-11-08 LAB — CBC
HCT: 33 % — ABNORMAL LOW (ref 36.0–46.0)
Hemoglobin: 10.6 g/dL — ABNORMAL LOW (ref 12.0–15.0)
MCH: 29.9 pg (ref 26.0–34.0)
MCHC: 32.1 g/dL (ref 30.0–36.0)
MCV: 93.2 fL (ref 80.0–100.0)
Platelets: 163 10*3/uL (ref 150–400)
RBC: 3.54 MIL/uL — ABNORMAL LOW (ref 3.87–5.11)
RDW: 12.4 % (ref 11.5–15.5)
WBC: 8.2 10*3/uL (ref 4.0–10.5)
nRBC: 0 % (ref 0.0–0.2)

## 2021-11-08 LAB — PHOSPHORUS: Phosphorus: 4.2 mg/dL (ref 2.5–4.6)

## 2021-11-08 LAB — TROPONIN I (HIGH SENSITIVITY): Troponin I (High Sensitivity): 188 ng/L (ref <18)

## 2021-11-08 LAB — APTT: aPTT: 67 seconds — ABNORMAL HIGH (ref 24–36)

## 2021-11-08 LAB — MAGNESIUM: Magnesium: 2.1 mg/dL (ref 1.7–2.4)

## 2021-11-08 SURGERY — PULMONARY THROMBECTOMY
Anesthesia: Moderate Sedation | Laterality: Left

## 2021-11-08 MED ORDER — HEPARIN (PORCINE) 25000 UT/250ML-% IV SOLN
1000.0000 [IU]/h | INTRAVENOUS | Status: DC
  Administered 2021-11-08 – 2021-11-11 (×4): 1000 [IU]/h via INTRAVENOUS
  Filled 2021-11-08 (×3): qty 250

## 2021-11-08 MED ORDER — CEFAZOLIN SODIUM-DEXTROSE 1-4 GM/50ML-% IV SOLN
1.0000 g | INTRAVENOUS | Status: AC
  Administered 2021-11-08: 1 g via INTRAVENOUS

## 2021-11-08 MED ORDER — HEPARIN SODIUM (PORCINE) 1000 UNIT/ML IJ SOLN
INTRAMUSCULAR | Status: AC
  Filled 2021-11-08: qty 10

## 2021-11-08 MED ORDER — IODIXANOL 320 MG/ML IV SOLN
INTRAVENOUS | Status: DC | PRN
  Administered 2021-11-08: 60 mL

## 2021-11-08 MED ORDER — MIDAZOLAM HCL 2 MG/2ML IJ SOLN
INTRAMUSCULAR | Status: DC | PRN
  Administered 2021-11-08: 1 mg via INTRAVENOUS
  Administered 2021-11-08: .5 mg via INTRAVENOUS

## 2021-11-08 MED ORDER — MIDAZOLAM HCL 5 MG/5ML IJ SOLN
INTRAMUSCULAR | Status: AC
  Filled 2021-11-08: qty 5

## 2021-11-08 MED ORDER — FENTANYL CITRATE PF 50 MCG/ML IJ SOSY
PREFILLED_SYRINGE | INTRAMUSCULAR | Status: AC
  Filled 2021-11-08: qty 2

## 2021-11-08 MED ORDER — FENTANYL CITRATE (PF) 100 MCG/2ML IJ SOLN
INTRAMUSCULAR | Status: DC | PRN
  Administered 2021-11-08: 25 ug via INTRAVENOUS
  Administered 2021-11-08: 50 ug via INTRAVENOUS

## 2021-11-08 MED ORDER — HEPARIN SODIUM (PORCINE) 1000 UNIT/ML IJ SOLN
INTRAMUSCULAR | Status: DC | PRN
  Administered 2021-11-08: 3000 [IU] via INTRAVENOUS

## 2021-11-08 MED ORDER — POLYETHYLENE GLYCOL 3350 17 G PO PACK
17.0000 g | PACK | Freq: Every day | ORAL | Status: DC
  Administered 2021-11-09 – 2021-11-17 (×7): 17 g via ORAL
  Filled 2021-11-08 (×9): qty 1

## 2021-11-08 MED ORDER — SENNOSIDES-DOCUSATE SODIUM 8.6-50 MG PO TABS
2.0000 | ORAL_TABLET | Freq: Two times a day (BID) | ORAL | Status: DC
  Administered 2021-11-08 – 2021-11-17 (×16): 2 via ORAL
  Filled 2021-11-08 (×16): qty 2

## 2021-11-08 SURGICAL SUPPLY — 17 items
CANISTER PENUMBRA ENGINE (MISCELLANEOUS) ×1 IMPLANT
CANNULA 5F STIFF (CANNULA) ×1 IMPLANT
CATH ANGIO 5F PIGTAIL 100CM (CATHETERS) ×1 IMPLANT
CATH INFINITI JR4 5F (CATHETERS) ×1 IMPLANT
CATH LIGHTNING 8 XTORQ 115 (CATHETERS) ×1 IMPLANT
DEVICE SAFEGUARD 24CM (GAUZE/BANDAGES/DRESSINGS) ×1 IMPLANT
DEVICE TORQUE (MISCELLANEOUS) ×1 IMPLANT
GLIDEWIRE ANGLED SS 035X260CM (WIRE) ×1 IMPLANT
KIT FEM OPTION ELITE FILTER (Filter) ×1 IMPLANT
PACK ANGIOGRAPHY (CUSTOM PROCEDURE TRAY) ×2 IMPLANT
SHEATH 9FRX11 (SHEATH) ×1 IMPLANT
SHIELD X-DRAPE GOLD 12X17 (MISCELLANEOUS) ×1 IMPLANT
SUT SILK 0 FSL (SUTURE) ×1 IMPLANT
SYR MEDRAD MARK 7 150ML (SYRINGE) ×1 IMPLANT
TUBING CONTRAST HIGH PRESS 72 (TUBING) ×1 IMPLANT
WIRE AMPLATZ SSTIFF .035X260CM (WIRE) ×1 IMPLANT
WIRE GUIDERIGHT .035X150 (WIRE) ×1 IMPLANT

## 2021-11-08 NOTE — H&P (View-Only) (Signed)
@LOGO @   MRN : [id number]  Cheryl Schroeder is a 66 y.o. ([birth year]) female who presents with chief complaint of chest pain.  History of Present Illness:   I am asked to see the patient by Dr. Raul Del.  Patient is a 66 year old woman who was admitted to [facility] 2 days ago with a complaint of chest pain as well as feeling dizzy to the point that she was unable to maintain her balance.  In retrospect she has been having similar symptoms for approximately 3 weeks.  At that time she presented with left leg swelling and pain and was found to have a DVT.  She was started on Eliquis.  She was also noted to have some tenderness of her rib cage bilaterally as well as pleuritic chest pain with deep breathing and coughing.  She states she has been taking her Eliquis as directed.  2 days ago she presented to the emergency room where work-up included a CT of the chest with contrast this demonstrated a large mass obstructing the right upper lobe and partially obstructing the right middle lobe.  Moderate to large size pulmonary emboli were noted in the left upper and lower lobe compromising the entire left lung.  Current Meds  Medication Sig   ALPRAZolam (XANAX) 0.5 MG tablet Take 0.5-1 mg by mouth at bedtime as needed.   ELIQUIS 5 MG TABS tablet Take 5 mg by mouth 2 (two) times daily.   FLUoxetine (PROZAC) 40 MG capsule Take 40 mg by mouth daily.   gabapentin (NEURONTIN) 300 MG capsule Take 300 mg by mouth 3 (three) times daily. Three pills at night as needed   HYDROcodone-acetaminophen (NORCO/VICODIN) 5-325 MG per tablet Take 1 tablet by mouth as needed for moderate pain.   lamoTRIgine (LAMICTAL) 200 MG tablet Take 200 mg by mouth daily.   omeprazole (PRILOSEC) 40 MG capsule Take 40 mg by mouth daily.   pravastatin (PRAVACHOL) 40 MG tablet Take 40 mg by mouth daily.    Past Medical History:  Diagnosis Date   Bipolar 2 disorder ([place])    Dysplastic nevus    R calf, txted in  past, pt states ~2002   Fibromyalgia    Psoriasis     Past Surgical History:  Procedure Laterality Date   ABDOMINAL HYSTERECTOMY     ADENOIDECTOMY     BREAST BIOPSY Right 1990   cyst   TONSILLECTOMY      Social History Social History   Tobacco Use   Smoking status: Never   Smokeless tobacco: Never  Vaping Use   Vaping Use: Never used  Substance Use Topics   Alcohol use: No   Drug use: No    Family History Family History  Problem Relation Age of Onset   Breast cancer Maternal Aunt 72   Lung cancer Father     Allergies  Allergen Reactions   Sulfa Antibiotics Nausea Only and Nausea And Vomiting     REVIEW OF SYSTEMS (Negative unless checked)  Constitutional: [] Weight loss  [] Fever  [] Chills Cardiac: [] Chest pain   [] Chest pressure   [] Palpitations   [] Shortness of breath when laying flat   [x] Shortness of breath with exertion. Vascular:  [] Pain in legs with walking   [] Pain in legs at rest  [x] History of DVT   [] Phlebitis   [x] Swelling in legs   [] Varicose veins   [] Non-healing ulcers Pulmonary:   [] Uses home oxygen   [] Productive cough   [] Hemoptysis   [] Wheeze  [] COPD   []   Asthma Neurologic:  [] Dizziness   [] Seizures   [] History of stroke   [] History of TIA  [] Aphasia   [] Vissual changes   [] Weakness or numbness in arm   [] Weakness or numbness in leg Musculoskeletal:   [] Joint swelling   [] Joint pain   [] Low back pain Hematologic:  [] Easy bruising  [] Easy bleeding   [] Hypercoagulable state   [] Anemic Gastrointestinal:  [] Diarrhea   [] Vomiting  [] Gastroesophageal reflux/heartburn   [] Difficulty swallowing. Genitourinary:  [] Chronic kidney disease   [] Difficult urination  [] Frequent urination   [] Blood in urine Skin:  [] Rashes   [] Ulcers  Psychological:  [] History of anxiety   []  History of major depression.  Physical Examination  Vitals:   11/07/21 1948 11/08/21 0600 11/08/21 0737 11/08/21 0814  BP: 123/71  119/79 126/76  Pulse: 81 75 79 78  Resp: 18 20 18 19    Temp: 98.5 F (36.9 C)  98.5 F (36.9 C) 98.4 F (36.9 C)  TempSrc: Oral   Oral  SpO2: 95%  97% 92%  Weight:      Height:       Body mass index is 20.59 kg/m. Gen: WD/WN, NAD Head: [place]/AT, No temporalis wasting.  Ear/Nose/Throat: Hearing grossly intact, nares w/o erythema or drainage, pinna without lesions Eyes: PER, EOMI, sclera nonicteric.  Neck: Supple, no gross masses.  No JVD.  Pulmonary:  Good air movement, no audible wheezing, no use of accessory muscles.  Cardiac: RRR, precordium not hyperdynamic. Vascular:    2+ soft pitting edema left greater than right Vessel Right Left  Radial Palpable Palpable  Gastrointestinal: soft, non-distended. No guarding/no peritoneal signs.  Musculoskeletal: M/S 5/5 throughout.  No deformity.  Neurologic: CN 2-12 intact. Pain and light touch intact in extremities.  Symmetrical.  Speech is fluent. Motor exam as listed above. Psychiatric: Judgment intact, Mood & affect appropriate for pt's clinical situation. Dermatologic: Venous rashes no ulcers noted.  No changes consistent with cellulitis. Lymph : No lichenification or skin changes of chronic lymphedema.  CBC Lab Results  Component Value Date   WBC 8.2 11/08/2021   HGB 10.6 (L) 11/08/2021   HCT 33.0 (L) 11/08/2021   MCV 93.2 11/08/2021   PLT 163 11/08/2021    BMET    Component Value Date/Time   NA 138 11/08/2021 0556   K 4.2 11/08/2021 0556   CL 104 11/08/2021 0556   CO2 27 11/08/2021 0556   GLUCOSE 92 11/08/2021 0556   BUN 12 11/08/2021 0556   CREATININE 0.89 11/08/2021 0556   CALCIUM 9.4 11/08/2021 0556   GFRNONAA >60 11/08/2021 0556   Estimated Creatinine Clearance: 56 mL/min (by C-G formula based on SCr of 0.89 mg/dL).  COAG Lab Results  Component Value Date   INR 1.3 (H) 11/06/2021    Radiology DG Chest 2 View  Result Date: 10/22/2021 CLINICAL DATA:  Contusion of the rib on the left side. Left rib pain. EXAM: CHEST - 2 VIEW COMPARISON:  None. FINDINGS: The  heart size and mediastinal contours are within normal limits. Soft tissue density mass with area of atelectasis in the right mid lung measuring approximately 3.0 x 4.9 cm. Bibasilar atelectasis. Hyperinflated lungs. No pleural effusion or pneumothorax. The visualized skeletal structures are unremarkable. IMPRESSION: 1. Soft tissue density mass in the right mid lung measuring at least 3.0 x 4.9 cm, it may represent pneumonia, loculated pleural effusion or pulmonary mass. Further evaluation with CT examination would be helpful. Follow-up to resolution is recommended. 2. Hyperinflated lungs concerning for COPD. Bibasilar atelectasis. No  pleural effusion or pneumothorax. An attempt was made to reach out to the provider Dr. Clemmie Krill without response from our office at the time of dictation. Electronically Signed   By: Keane Police D.O.   On: 10/22/2021 16:07   DG Ribs Unilateral W/Chest Left  Result Date: 11/06/2021 CLINICAL DATA:  Fall, left rib pain EXAM: LEFT RIBS AND CHEST - 3+ VIEW COMPARISON:  Chest x-ray 10/22/2021 FINDINGS: No acute fracture identified in the left ribs. Cardiomediastinal silhouette is unchanged. Persistent ovoid masslike opacity adjacent to the fissure in the right lung measuring approximately 5 x 3.2 cm, grossly unchanged. Linear opacities in the left lower lung zone likely represent subsegmental atelectasis. Hyperinflated lungs with mildly prominent interstitial lung markings. No pleural effusion or pneumothorax visualized. IMPRESSION: 1. No left rib fractures identified. 2. Persistent and unchanged ovoid perifissural masslike opacity in the right lung. 3. COPD. Electronically Signed   By: Ofilia Neas M.D.   On: 11/06/2021 12:16   CT Angio Chest PE W and/or Wo Contrast  Result Date: 11/06/2021 CLINICAL DATA:  Chest pain, recent trauma EXAM: CT ANGIOGRAPHY CHEST WITH CONTRAST TECHNIQUE: Multidetector CT imaging of the chest was performed using the standard protocol during bolus  administration of intravenous contrast. Multiplanar CT image reconstructions and MIPs were obtained to evaluate the vascular anatomy. RADIATION DOSE REDUCTION: This exam was performed according to the departmental dose-optimization program which includes automated exposure control, adjustment of the mA and/or kV according to patient size and/or use of iterative reconstruction technique. CONTRAST:  125mL OMNIPAQUE IOHEXOL 350 MG/ML SOLN COMPARISON:  Chest radiographs done earlier today FINDINGS: Cardiovascular: There is homogeneous enhancement in the thoracic aorta. There is mild ectasia of main pulmonary artery measuring 3.2 cm. There is saddle embolus in the left main pulmonary artery extending into the left upper lobe and left lower lobe branches. There are few small filling defects in the subsegmental branches in the right middle lobe and right lower lobe. There are no imaging signs of right ventricular strain. Mediastinum/Nodes: No significant lymphadenopathy seen. There is 2.5 cm low-density lesion in the enlarged left lobe of thyroid. Lungs/Pleura: There is moderate right pleural effusion. There are small patchy infiltrates in the medial aspect of right upper lobe in the right upper lung fields. There is 5.2 x 4.1 cm homogeneous density inseparable from interlobar fissure in the anterior right parahilar region. There is narrowing of the lumen of the bronchus leading to this homogeneous opacity in the anteromedial right mid lung fields. Linear patchy infiltrates are seen in both lower lung fields, more so on the right side. In image 13 of series 6, there is 2 cm ground-glass density in the anterior left apex. There is moderate right pleural effusion. There is no significant left pleural effusion. There is no pneumothorax. Upper Abdomen: Fatty liver. Musculoskeletal: There are numerous lytic lesions of varying sizes in the left ribs, thoracic spine and lumbar spine. There is decrease in height of bodies of C6  vertebra. There is also decrease in height of bodies of T6, T7, T8 and T9 vertebrae. Review of the MIP images confirms the above findings. IMPRESSION: There is evidence of acute pulmonary embolism with small to moderate thrombus burden. There are no imaging signs of acute right ventricular strain. There is no evidence of thoracic aortic dissection. There is 4.8 cm homogeneous opacity in the anterior right mid lung fields. This lesion has to be considered primary malignant neoplasm until proven otherwise. There is narrowing of bronchus leading to this parahilar  density. PET-CT and biopsy as warranted should be considered. There are numerous lytic lesions of varying sizes along with compression fractures in the cervical and thoracic spine as described in the body of the report consistent with extensive skeletal metastatic disease. Moderate right pleural effusion. There are ground-glass and patchy alveolar infiltrates in both lungs as described in the body of the report suggesting multifocal pneumonia. There is 2.5 cm low-density nodule in the left lobe of thyroid. When the patient's clinical condition permits, thyroid sonogram may be considered. Electronically Signed   By: Elmer Picker M.D.   On: 11/06/2021 15:21   CT ABDOMEN PELVIS W CONTRAST  Result Date: 11/06/2021 CLINICAL DATA:  Abdominal pain, acute, nonlocalized. Status post fall EXAM: CT ABDOMEN AND PELVIS WITH CONTRAST TECHNIQUE: Multidetector CT imaging of the abdomen and pelvis was performed using the standard protocol following bolus administration of intravenous contrast. RADIATION DOSE REDUCTION: This exam was performed according to the departmental dose-optimization program which includes automated exposure control, adjustment of the mA and/or kV according to patient size and/or use of iterative reconstruction technique. CONTRAST:  14mL OMNIPAQUE IOHEXOL 350 MG/ML SOLN COMPARISON:  None. FINDINGS: Lower chest: Right pleural effusion. Please  see separately dictated CT angiography chest 11/06/2021. Ports and Devices: None. Liver: Not enlarged. Several vague hypodense right hepatic lobe lesions measuring 0.9, 1.4, 0.7, 0.5 cm. No laceration or subcapsular hematoma. Biliary System: The gallbladder is otherwise unremarkable with no radio-opaque gallstones. No biliary ductal dilatation. Pancreas: Normal pancreatic contour. No main pancreatic duct dilatation. Spleen: Not enlarged. No focal lesion. No laceration, subcapsular hematoma, or vascular injury. Adrenal Glands: No nodularity bilaterally. Kidneys: Bilateral kidneys enhance symmetrically. No hydronephrosis. Subcentimeter hypodensities are too small characterize. A 1.1 cm vague wedge-shaped hypodensity along the left superior renal pole that likely represents a poorly defined lesion with a density of 45 Hounsfield units. No contusion, laceration, or subcapsular hematoma. No injury to the vascular structures or collecting systems. No hydroureter. The urinary bladder is unremarkable. Bowel: No small or large bowel wall thickening or dilatation. Non-visualization of the appendix. The appendix is not definitely identified with no inflammatory changes in the right lower quadrant to suggest acute appendicitis. Mesentery, Omentum, and Peritoneum: No simple free fluid ascites. No pneumoperitoneum. No hemoperitoneum. No mesenteric hematoma identified. No organized fluid collection. Pelvic Organs: Normal. Lymph Nodes: No abdominal, pelvic, inguinal lymphadenopathy. Vasculature: No abdominal aorta or iliac aneurysm. No active contrast extravasation or pseudoaneurysm. Musculoskeletal: No significant soft tissue hematoma. No acute pelvic fracture. No spinal fracture. Diffuse appendicular and axial skeleton lytic lesions. IMPRESSION: 1.  No acute traumatic injury to the abdomen, or pelvis. 2. No acute fracture or traumatic malalignment of the lumbar spine. 3. Several vague indeterminate hypodense right hepatic lobe  lesions measuring 0.9, 1.4, 0.7, 0.5 cm. Findings could represent metastases. 4. 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 5. An indeterminate 1.1 cm vague slightly wedge-shaped hypodensity along the left superior renal pole. Finding could represent an underlying poorly defined lesion, small infarction, less likely focal pyelonephritis. 6. Please see separately dictated CT angiography chest 11/06/2021. Electronically Signed   By: Iven Finn M.D.   On: 11/06/2021 15:15   DG Chest Port 1 View  Result Date: 11/07/2021 CLINICAL DATA:  Status post thorenthesis EXAM: PORTABLE CHEST 1 VIEW COMPARISON:  Chest radiograph 10/22/2021. Chest CT November 06, 2021. FINDINGS: Similar versus mildly increased right midlung masslike opacity. There is some fluid tracking along the right minor fissure. No visible pneumothorax. No consolidation. Similar cardiomediastinal silhouette. IMPRESSION:  1. Similar versus mildly increased right midlung masslike opacity, further characterized on recent chest CT. There is some fluid tracking along the right minor fissure. 2. No visible pneumothorax status post thoracentesis. Electronically Signed   By: Margaretha Sheffield M.D.   On: 11/07/2021 14:43   ECHOCARDIOGRAM COMPLETE  Result Date: 11/07/2021    ECHOCARDIOGRAM REPORT   Patient Name:   WADE SIGALA Date of Exam: 11/07/2021 Medical Rec #:  [id number]      Height:       65.5 in Accession #:    [id number]     Weight:       125.7 lb Date of Birth:  Feb 17, 1956      BSA:          1.633 m Patient Age:    9 years       BP:           124/80 mmHg Patient Gender: F              HR:           78 bpm. Exam Location:  [facility] Procedure: 2D Echo, Cardiac Doppler and Color Doppler Indications:     Pulmonary Embolism I26.09  History:         Patient has no prior history of Echocardiogram examinations.                  Pulmonary emboli, acute pulmonary embolism.  Sonographer:     Sherrie Sport Referring Phys:  [id number] Lequita Halt Diagnosing  Phys: Donnelly Angelica IMPRESSIONS  1. Left ventricular ejection fraction, by estimation, is 60 to 65%. The left ventricle has normal function. The left ventricle has no regional wall motion abnormalities. Left ventricular diastolic parameters are consistent with Grade I diastolic dysfunction (impaired relaxation).  2. Right ventricular systolic function is normal. The right ventricular size is mildly enlarged. There is mildly elevated pulmonary artery systolic pressure. The estimated right ventricular systolic pressure is 63.1 mmHg.  3. Focal thickening of MV leaflet. Correlate clinically. . The mitral valve is degenerative. Mild mitral valve regurgitation. No evidence of mitral stenosis.  4. The aortic valve is normal in structure. Aortic valve regurgitation is not visualized. No aortic stenosis is present.  5. The inferior vena cava is normal in size with <50% respiratory variability, suggesting right atrial pressure of 8 mmHg. FINDINGS  Left Ventricle: Left ventricular ejection fraction, by estimation, is 60 to 65%. The left ventricle has normal function. The left ventricle has no regional wall motion abnormalities. The left ventricular internal cavity size was normal in size. There is  no left ventricular hypertrophy. Left ventricular diastolic parameters are consistent with Grade I diastolic dysfunction (impaired relaxation). Right Ventricle: The right ventricular size is mildly enlarged. No increase in right ventricular wall thickness. Right ventricular systolic function is normal. There is mildly elevated pulmonary artery systolic pressure. The tricuspid regurgitant velocity is 2.81 m/s, and with an assumed right atrial pressure of 8 mmHg, the estimated right ventricular systolic pressure is 49.7 mmHg. Left Atrium: Left atrial size was normal in size. Right Atrium: Right atrial size was normal in size. Pericardium: There is no evidence of pericardial effusion. Mitral Valve: Focal thickening of MV leaflet.  Correlate clinically. The mitral valve is degenerative in appearance. Mild mitral valve regurgitation. No evidence of mitral valve stenosis. MV peak gradient, 6.7 mmHg. The mean mitral valve gradient is 3.0 mmHg. Tricuspid Valve: The tricuspid valve is normal in structure. Tricuspid valve regurgitation is mild .  No evidence of tricuspid stenosis. Aortic Valve: The aortic valve is normal in structure. Aortic valve regurgitation is not visualized. No aortic stenosis is present. Aortic valve mean gradient measures 3.0 mmHg. Aortic valve peak gradient measures 4.8 mmHg. Aortic valve area, by VTI measures 2.53 cm. Pulmonic Valve: The pulmonic valve was normal in structure. Pulmonic valve regurgitation is not visualized. No evidence of pulmonic stenosis. Aorta: The aortic root is normal in size and structure. Venous: The inferior vena cava is normal in size with less than 50% respiratory variability, suggesting right atrial pressure of 8 mmHg. IAS/Shunts: No atrial level shunt detected by color flow Doppler.  LEFT VENTRICLE PLAX 2D LVIDd:         3.70 cm   Diastology LVIDs:         2.30 cm   LV e' medial:    5.11 cm/s LV PW:         1.40 cm   LV E/e' medial:  15.9 LV IVS:        1.10 cm   LV e' lateral:   4.35 cm/s LVOT diam:     2.00 cm   LV E/e' lateral: 18.7 LV SV:         52 LV SV Index:   32 LVOT Area:     3.14 cm  RIGHT VENTRICLE RV Basal diam:  2.90 cm RV S prime:     13.10 cm/s TAPSE (M-mode): 2.1 cm LEFT ATRIUM             Index        RIGHT ATRIUM           Index LA diam:        2.50 cm 1.53 cm/m   RA Area:     14.40 cm LA Vol (A2C):   55.7 ml 34.11 ml/m  RA Volume:   34.30 ml  21.01 ml/m LA Vol (A4C):   50.6 ml 30.99 ml/m LA Biplane Vol: 53.7 ml 32.89 ml/m  AORTIC VALVE                    PULMONIC VALVE AV Area (Vmax):    2.69 cm     PV Vmax:        0.70 m/s AV Area (Vmean):   2.50 cm     PV Vmean:       47.133 cm/s AV Area (VTI):     2.53 cm     PV VTI:         0.128 m AV Vmax:           109.00 cm/s   PV Peak grad:   1.9 mmHg AV Vmean:          74.700 cm/s  PV Mean grad:   1.0 mmHg AV VTI:            0.206 m      RVOT Peak grad: 3 mmHg AV Peak Grad:      4.8 mmHg AV Mean Grad:      3.0 mmHg LVOT Vmax:         93.30 cm/s LVOT Vmean:        59.400 cm/s LVOT VTI:          0.166 m LVOT/AV VTI ratio: 0.81  AORTA Ao Root diam: 3.17 cm MITRAL VALVE                TRICUSPID VALVE MV Area (PHT): 3.17 cm     TR  Peak grad:   31.6 mmHg MV Area VTI:   1.83 cm     TR Vmax:        281.00 cm/s MV Peak grad:  6.7 mmHg MV Mean grad:  3.0 mmHg     SHUNTS MV Vmax:       1.29 m/s     Systemic VTI:  0.17 m MV Vmean:      75.9 cm/s    Systemic Diam: 2.00 cm MV Decel Time: 239 msec     Pulmonic VTI:  0.158 m MV E velocity: 81.40 cm/s MV A velocity: 109.00 cm/s MV E/A ratio:  0.75 Donnelly Angelica Electronically signed by Donnelly Angelica Signature Date/Time: 11/07/2021/1:06:16 PM    Final    US THORACENTESIS ASP PLEURAL SPACE W/IMG GUIDE  Result Date: 11/07/2021 INDICATION: 66 year old with right lung mass and concern for metastatic disease. EXAM: ULTRASOUND GUIDED RIGHT THORACENTESIS MEDICATIONS: None. COMPLICATIONS: None immediate. PROCEDURE: An ultrasound guided thoracentesis was thoroughly discussed with the patient and questions answered. The benefits, risks, alternatives and complications were also discussed. The patient understands and wishes to proceed with the procedure. Written consent was obtained. Ultrasound was performed to localize and mark an adequate pocket of fluid in the right chest. The area was then prepped and draped in the normal sterile fashion. 1% Lidocaine was used for local anesthesia. Under ultrasound guidance a 6 Fr Safe-T-Centesis catheter was introduced. Thoracentesis was performed. The catheter was removed and a dressing applied. FINDINGS: A total of approximately 350 mL of amber colored fluid was removed. Samples were sent to the laboratory as requested by the clinical team. IMPRESSION: Successful ultrasound  guided right thoracentesis yielding 350 mL of pleural fluid. Electronically Signed   By: Markus Daft M.D.   On: 11/07/2021 15:01     Assessment/Plan  Pulmonary emboli left lung with severely compromised pulmonary status. I recommend she undergo left pulmonary thrombectomy given the severe compromise of her right lung.  This malignancy is recently diagnosed and her treatment plan is still evolving.  Therefore, any immediate improvement we can effect in the short-term would be tremendously helpful for her quality life and daily activities.  Risk and benefits of been reviewed all questions been answered patient has agreed to proceed  2.  Failure of anticoagulation: Given she failed Eliquis I concur with Dr. Raul Del an IVC filter is most appropriate.  Risks and benefits of been reviewed all questions were answered patient has agreed and we will plan for IVC filter placement after thrombectomy  3.  DVT left leg: She will continue on anticoagulation I discussed with Dr. Raul Del we can change her from Eliquis to Xarelto and given the 21 days of the higher dose this should provide appropriate anticoagulation  4.  Right lung malignancy: This is clearly causing compromise of her right lung and is reducing her overall pulmonary capacity.  In conjunction with her left pulmonary emboli she is now symptomatic with shortness of breath and syncopal episodes.  The pulmonary service is all ready involved in his evaluating this but given this finding thrombectomy of her left lung is indicated.   Hortencia Pilar, MD  11/08/2021 11:34 AM

## 2021-11-08 NOTE — Care Management Important Message (Signed)
Important Message  Patient Details  Name: Cheryl Schroeder MRN: [id number] Date of Birth: [birth year]   Medicare Important Message Given:  N/A - LOS <3 / Initial given by admissions     Dannette Barbara 11/08/2021, 8:48 AM

## 2021-11-08 NOTE — Consult Note (Signed)
Hematology/Oncology Consult note [phone] [phone]      Patient Care Team: Lynnell Jude, MD as PCP - General (Family Medicine)   Name of the patient: Cheryl Schroeder  [id number]  Dec 10, 1955   Date of visit: 11/08/21 REASON FOR COSULTATION:  Lung mass, pleural effusion, PE History of presenting illness-  66 y.o. female with past medical history including recent DVT of the left lower extremity on Eliquis, fibromyalgia, bipolar disorder, HLD, chronic peripheral neuropathy who presented to emergency room for evaluation of worsening of chest pain, unsteady gait and frequent falls. Patient initially developed left lower extremity swelling and tenderness about 3 weeks ago, ultrasound showed DVT and the patient was started on Eliquis.  Patient reports that she takes Eliquis most of the days, she may have " skipped 1 or 2 doses". She has also noticed pain of bilateral rib cages, pleuritic chest pain.  11/06/2021 CT chest angiogram positive for acute pulmonary embolism, 4.8 cm homogeneous opacity in the anterior right mid lung fields.  There is narrowing of bronchus leading to this perihilar density.  There are numerous lytic lesions of varying sizes along with compression fractures in the cervical and upper thoracic spine, consistent with extensive skeletal metastatic disease.  Moderate right pleural effusion.  There are groundglass and patchy alveolar infiltrates in both lungs, suggesting multifocal pneumonia.  2.5 cm low density nodules in the left lobe of thyroid. 11/06/2021, CT abdomen pelvis with contrast showed indeterminate hypodense right hepatic lobe lesions, possible metastasis.  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  Indeterminate 1.1 cm which shaped hypodensity along the left superior Renal pole.  Patient was admitted and started on heparin drip.   Hematology oncology was consulted for evaluation. Patient reports unintentional weight loss,  night sweats. + Chronic cough, denies any hemoptysis.  Weight loss seems to have been ongoing for the past 1 year.  She is single and lives with her mom who is 64 years old.  She has 2 sisters.  Her elder sister is at the bedside who reports that patient "eats like a bird ".  Patient is a retired Therapist, sports.  Family history of lung cancer-Father She smoked remotely for about "3 months ". Her left lower extremity swelling/tenderness have resolved.  Patient has been seen by vascular surgeon and was recommended for mechanical embolectomy and IVC filter placement.  Patient has been seen by Dr. Raul Del.  Status post right side thoracentesis.  Cytology is pending.  Review of Systems  Constitutional:  Positive for appetite change, fatigue and unexpected weight change. Negative for chills and fever.       Night sweats  HENT:   Negative for hearing loss and voice change.   Eyes:  Negative for eye problems.  Respiratory:  Positive for cough. Negative for chest tightness.        Bilateral rib cage pain  Cardiovascular:  Positive for leg swelling. Negative for chest pain.  Gastrointestinal:  Negative for abdominal distention, abdominal pain and blood in stool.  Endocrine: Negative for hot flashes.  Genitourinary:  Negative for difficulty urinating and frequency.   Musculoskeletal:  Negative for arthralgias.  Skin:  Negative for itching and rash.  Neurological:  Negative for extremity weakness.  Hematological:  Negative for adenopathy. Does not bruise/bleed easily.  Psychiatric/Behavioral:  Negative for confusion.        Bipolar disorders   Allergies  Allergen Reactions   Sulfa Antibiotics Nausea Only and Nausea And Vomiting    Patient Active Problem List  Diagnosis Date Noted   Acute pulmonary embolism ([name]) 11/06/2021   Pulmonary emboli ([name]) 11/06/2021   Plantar fasciitis 07/09/2014     Past Medical History:  Diagnosis Date   Bipolar 2 disorder ([place])    Dysplastic nevus    R calf, txted in  past, pt states ~2002   Fibromyalgia    Psoriasis      Past Surgical History:  Procedure Laterality Date   ABDOMINAL HYSTERECTOMY     ADENOIDECTOMY     BREAST BIOPSY Right 1990   cyst   TONSILLECTOMY      Social History   Socioeconomic History   Marital status: Single    Spouse name: Not on file   Number of children: Not on file   Years of education: Not on file   Highest education level: Not on file  Occupational History   Not on file  Tobacco Use   Smoking status: Never   Smokeless tobacco: Never  Vaping Use   Vaping Use: Never used  Substance and Sexual Activity   Alcohol use: No   Drug use: No   Sexual activity: Not on file  Other Topics Concern   Not on file  Social History Narrative   Not on file   Social Determinants of Health   Financial Resource Strain: Not on file  Food Insecurity: Not on file  Transportation Needs: Not on file  Physical Activity: Not on file  Stress: Not on file  Social Connections: Not on file  Intimate Partner Violence: Not on file     Family History  Problem Relation Age of Onset   Breast cancer Maternal Aunt 72   Lung cancer Father      Current Facility-Administered Medications:    0.9 %  sodium chloride infusion, 250 mL, Intravenous, PRN, Wynetta Fines T, MD   acetaminophen (TYLENOL) tablet 650 mg, 650 mg, Oral, Q4H PRN, Wynetta Fines T, MD, 650 mg at 11/06/21 2019   ALPRAZolam (XANAX) tablet 0.5-1 mg, 0.5-1 mg, Oral, QHS PRN, Wynetta Fines T, MD   feeding supplement (ENSURE ENLIVE / ENSURE PLUS) liquid 237 mL, 237 mL, Oral, BID BM, Wynetta Fines T, MD, 237 mL at 11/07/21 1459   FLUoxetine (PROZAC) capsule 40 mg, 40 mg, Oral, Daily, [place], Ping T, MD, 40 mg at 11/08/21 1048   fluticasone (FLONASE) 50 MCG/ACT nasal spray 1 spray, 1 spray, Each Nare, Daily, Wynetta Fines T, MD   gabapentin (NEURONTIN) capsule 300 mg, 300 mg, Oral, TID, [place], Ping T, MD, 300 mg at 11/08/21 1044   heparin ADULT infusion 100 units/mL (25000  units/223mL), 1,000 Units/hr, Intravenous, Continuous, Wynetta Fines T, MD, Last Rate: 10 mL/hr at 11/07/21 1929, 1,000 Units/hr at 11/07/21 1929   HYDROcodone-acetaminophen (NORCO/VICODIN) 5-325 MG per tablet 1 tablet, 1 tablet, Oral, Q4H PRN, Wynetta Fines T, MD, 1 tablet at 11/08/21 1043   HYDROmorphone (DILAUDID) injection 0.5 mg, 0.5 mg, Intravenous, Q4H PRN, Hall, Carole N, DO, 0.5 mg at 11/08/21 1154   lamoTRIgine (LAMICTAL) tablet 200 mg, 200 mg, Oral, Daily, [place], Ping T, MD, 200 mg at 11/08/21 1044   multivitamin with minerals tablet 1 tablet, 1 tablet, Oral, Daily, Hall, Carole N, DO, 1 tablet at 11/08/21 1044   ondansetron (ZOFRAN) injection 4 mg, 4 mg, Intravenous, Q6H PRN, Wynetta Fines T, MD, 4 mg at 11/07/21 1535   oxyCODONE (Oxy IR/ROXICODONE) immediate release tablet 5 mg, 5 mg, Oral, Q4H PRN, Irene Pap N, DO, 5 mg at 11/07/21 2057   pantoprazole (PROTONIX)  EC tablet 40 mg, 40 mg, Oral, Daily, Wynetta Fines T, MD, 40 mg at 11/08/21 1045   polyethylene glycol (MIRALAX / GLYCOLAX) packet 17 g, 17 g, Oral, Daily, Hall, Carole N, DO   pravastatin (PRAVACHOL) tablet 40 mg, 40 mg, Oral, QHS, Zhang, Ping T, MD, 40 mg at 11/07/21 2057   senna-docusate (Senokot-S) tablet 2 tablet, 2 tablet, Oral, BID, Kayleen Memos, DO, 2 tablet at 11/08/21 1045   sodium chloride flush (NS) 0.9 % injection 3 mL, 3 mL, Intravenous, Q12H, Wynetta Fines T, MD, 3 mL at 11/07/21 2104   sodium chloride flush (NS) 0.9 % injection 3 mL, 3 mL, Intravenous, PRN, Lequita Halt, MD   Physical exam:  Vitals:   11/07/21 1948 11/08/21 0600 11/08/21 0737 11/08/21 0814  BP: 123/71  119/79 126/76  Pulse: 81 75 79 78  Resp: 18 20 18 19   Temp: 98.5 F (36.9 C)  98.5 F (36.9 C) 98.4 F (36.9 C)  TempSrc: Oral   Oral  SpO2: 95%  97% 92%  Weight:      Height:       Physical Exam Constitutional:      General: She is not in acute distress.    Comments: Thin built, frail appearance  HENT:     Head: Normocephalic and  atraumatic.     Nose: Nose normal.     Mouth/Throat:     Pharynx: No oropharyngeal exudate.  Eyes:     General: No scleral icterus.    Pupils: Pupils are equal, round, and reactive to light.  Cardiovascular:     Rate and Rhythm: Normal rate and regular rhythm.     Heart sounds: No murmur heard. Pulmonary:     Effort: Pulmonary effort is normal. No respiratory distress.  Abdominal:     General: There is no distension.     Palpations: Abdomen is soft.     Tenderness: There is no abdominal tenderness.  Musculoskeletal:        General: Normal range of motion.     Cervical back: Normal range of motion and neck supple.  Skin:    General: Skin is warm and dry.     Findings: No erythema.  Neurological:     Mental Status: She is alert and oriented to person, place, and time.     Cranial Nerves: No cranial nerve deficit.     Motor: No abnormal muscle tone.     Coordination: Coordination normal.  Psychiatric:        Mood and Affect: Mood and affect normal.        CMP Latest Ref Rng & Units 11/08/2021  Glucose 70 - 99 mg/dL 92  BUN 8 - 23 mg/dL 12  Creatinine 0.44 - 1.00 mg/dL 0.89  Sodium 135 - 145 mmol/L 138  Potassium 3.5 - 5.1 mmol/L 4.2  Chloride 98 - 111 mmol/L 104  CO2 22 - 32 mmol/L 27  Calcium 8.9 - 10.3 mg/dL 9.4  Total Protein 6.5 - 8.1 g/dL -  Total Bilirubin 0.3 - 1.2 mg/dL -  Alkaline Phos 38 - 126 U/L -  AST 15 - 41 U/L -  ALT 0 - 44 U/L -   CBC Latest Ref Rng & Units 11/08/2021  WBC 4.0 - 10.5 K/uL 8.2  Hemoglobin 12.0 - 15.0 g/dL 10.6(L)  Hematocrit 36.0 - 46.0 % 33.0(L)  Platelets 150 - 400 K/uL 163    RADIOGRAPHIC STUDIES: I have personally reviewed the radiological images as listed and agreed  with the findings in the report. DG Chest 2 View  Result Date: 10/22/2021 CLINICAL DATA:  Contusion of the rib on the left side. Left rib pain. EXAM: CHEST - 2 VIEW COMPARISON:  None. FINDINGS: The heart size and mediastinal contours are within normal limits. Soft  tissue density mass with area of atelectasis in the right mid lung measuring approximately 3.0 x 4.9 cm. Bibasilar atelectasis. Hyperinflated lungs. No pleural effusion or pneumothorax. The visualized skeletal structures are unremarkable. IMPRESSION: 1. Soft tissue density mass in the right mid lung measuring at least 3.0 x 4.9 cm, it may represent pneumonia, loculated pleural effusion or pulmonary mass. Further evaluation with CT examination would be helpful. Follow-up to resolution is recommended. 2. Hyperinflated lungs concerning for COPD. Bibasilar atelectasis. No pleural effusion or pneumothorax. An attempt was made to reach out to the provider Dr. Clemmie Krill without response from our office at the time of dictation. Electronically Signed   By: Keane Police D.O.   On: 10/22/2021 16:07   DG Ribs Unilateral W/Chest Left  Result Date: 11/06/2021 CLINICAL DATA:  Fall, left rib pain EXAM: LEFT RIBS AND CHEST - 3+ VIEW COMPARISON:  Chest x-ray 10/22/2021 FINDINGS: No acute fracture identified in the left ribs. Cardiomediastinal silhouette is unchanged. Persistent ovoid masslike opacity adjacent to the fissure in the right lung measuring approximately 5 x 3.2 cm, grossly unchanged. Linear opacities in the left lower lung zone likely represent subsegmental atelectasis. Hyperinflated lungs with mildly prominent interstitial lung markings. No pleural effusion or pneumothorax visualized. IMPRESSION: 1. No left rib fractures identified. 2. Persistent and unchanged ovoid perifissural masslike opacity in the right lung. 3. COPD. Electronically Signed   By: Ofilia Neas M.D.   On: 11/06/2021 12:16   CT Angio Chest PE W and/or Wo Contrast  Result Date: 11/06/2021 CLINICAL DATA:  Chest pain, recent trauma EXAM: CT ANGIOGRAPHY CHEST WITH CONTRAST TECHNIQUE: Multidetector CT imaging of the chest was performed using the standard protocol during bolus administration of intravenous contrast. Multiplanar CT image  reconstructions and MIPs were obtained to evaluate the vascular anatomy. RADIATION DOSE REDUCTION: This exam was performed according to the departmental dose-optimization program which includes automated exposure control, adjustment of the mA and/or kV according to patient size and/or use of iterative reconstruction technique. CONTRAST:  153mL OMNIPAQUE IOHEXOL 350 MG/ML SOLN COMPARISON:  Chest radiographs done earlier today FINDINGS: Cardiovascular: There is homogeneous enhancement in the thoracic aorta. There is mild ectasia of main pulmonary artery measuring 3.2 cm. There is saddle embolus in the left main pulmonary artery extending into the left upper lobe and left lower lobe branches. There are few small filling defects in the subsegmental branches in the right middle lobe and right lower lobe. There are no imaging signs of right ventricular strain. Mediastinum/Nodes: No significant lymphadenopathy seen. There is 2.5 cm low-density lesion in the enlarged left lobe of thyroid. Lungs/Pleura: There is moderate right pleural effusion. There are small patchy infiltrates in the medial aspect of right upper lobe in the right upper lung fields. There is 5.2 x 4.1 cm homogeneous density inseparable from interlobar fissure in the anterior right parahilar region. There is narrowing of the lumen of the bronchus leading to this homogeneous opacity in the anteromedial right mid lung fields. Linear patchy infiltrates are seen in both lower lung fields, more so on the right side. In image 13 of series 6, there is 2 cm ground-glass density in the anterior left apex. There is moderate right pleural effusion. There is  no significant left pleural effusion. There is no pneumothorax. Upper Abdomen: Fatty liver. Musculoskeletal: There are numerous lytic lesions of varying sizes in the left ribs, thoracic spine and lumbar spine. There is decrease in height of bodies of C6 vertebra. There is also decrease in height of bodies of T6, T7,  T8 and T9 vertebrae. Review of the MIP images confirms the above findings. IMPRESSION: There is evidence of acute pulmonary embolism with small to moderate thrombus burden. There are no imaging signs of acute right ventricular strain. There is no evidence of thoracic aortic dissection. There is 4.8 cm homogeneous opacity in the anterior right mid lung fields. This lesion has to be considered primary malignant neoplasm until proven otherwise. There is narrowing of bronchus leading to this parahilar density. PET-CT and biopsy as warranted should be considered. There are numerous lytic lesions of varying sizes along with compression fractures in the cervical and thoracic spine as described in the body of the report consistent with extensive skeletal metastatic disease. Moderate right pleural effusion. There are ground-glass and patchy alveolar infiltrates in both lungs as described in the body of the report suggesting multifocal pneumonia. There is 2.5 cm low-density nodule in the left lobe of thyroid. When the patient's clinical condition permits, thyroid sonogram may be considered. Electronically Signed   By: Elmer Picker M.D.   On: 11/06/2021 15:21   CT ABDOMEN PELVIS W CONTRAST  Result Date: 11/06/2021 CLINICAL DATA:  Abdominal pain, acute, nonlocalized. Status post fall EXAM: CT ABDOMEN AND PELVIS WITH CONTRAST TECHNIQUE: Multidetector CT imaging of the abdomen and pelvis was performed using the standard protocol following bolus administration of intravenous contrast. RADIATION DOSE REDUCTION: This exam was performed according to the departmental dose-optimization program which includes automated exposure control, adjustment of the mA and/or kV according to patient size and/or use of iterative reconstruction technique. CONTRAST:  140mL OMNIPAQUE IOHEXOL 350 MG/ML SOLN COMPARISON:  None. FINDINGS: Lower chest: Right pleural effusion. Please see separately dictated CT angiography chest 11/06/2021. Ports  and Devices: None. Liver: Not enlarged. Several vague hypodense right hepatic lobe lesions measuring 0.9, 1.4, 0.7, 0.5 cm. No laceration or subcapsular hematoma. Biliary System: The gallbladder is otherwise unremarkable with no radio-opaque gallstones. No biliary ductal dilatation. Pancreas: Normal pancreatic contour. No main pancreatic duct dilatation. Spleen: Not enlarged. No focal lesion. No laceration, subcapsular hematoma, or vascular injury. Adrenal Glands: No nodularity bilaterally. Kidneys: Bilateral kidneys enhance symmetrically. No hydronephrosis. Subcentimeter hypodensities are too small characterize. A 1.1 cm vague wedge-shaped hypodensity along the left superior renal pole that likely represents a poorly defined lesion with a density of 45 Hounsfield units. No contusion, laceration, or subcapsular hematoma. No injury to the vascular structures or collecting systems. No hydroureter. The urinary bladder is unremarkable. Bowel: No small or large bowel wall thickening or dilatation. Non-visualization of the appendix. The appendix is not definitely identified with no inflammatory changes in the right lower quadrant to suggest acute appendicitis. Mesentery, Omentum, and Peritoneum: No simple free fluid ascites. No pneumoperitoneum. No hemoperitoneum. No mesenteric hematoma identified. No organized fluid collection. Pelvic Organs: Normal. Lymph Nodes: No abdominal, pelvic, inguinal lymphadenopathy. Vasculature: No abdominal aorta or iliac aneurysm. No active contrast extravasation or pseudoaneurysm. Musculoskeletal: No significant soft tissue hematoma. No acute pelvic fracture. No spinal fracture. Diffuse appendicular and axial skeleton lytic lesions. IMPRESSION: 1.  No acute traumatic injury to the abdomen, or pelvis. 2. No acute fracture or traumatic malalignment of the lumbar spine. 3. Several vague indeterminate hypodense right hepatic lobe  lesions measuring 0.9, 1.4, 0.7, 0.5 cm. Findings could represent  metastases. 4. 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 5. An indeterminate 1.1 cm vague slightly wedge-shaped hypodensity along the left superior renal pole. Finding could represent an underlying poorly defined lesion, small infarction, less likely focal pyelonephritis. 6. Please see separately dictated CT angiography chest 11/06/2021. Electronically Signed   By: Iven Finn M.D.   On: 11/06/2021 15:15   DG Chest Port 1 View  Result Date: 11/07/2021 CLINICAL DATA:  Status post thorenthesis EXAM: PORTABLE CHEST 1 VIEW COMPARISON:  Chest radiograph 10/22/2021. Chest CT November 06, 2021. FINDINGS: Similar versus mildly increased right midlung masslike opacity. There is some fluid tracking along the right minor fissure. No visible pneumothorax. No consolidation. Similar cardiomediastinal silhouette. IMPRESSION: 1. Similar versus mildly increased right midlung masslike opacity, further characterized on recent chest CT. There is some fluid tracking along the right minor fissure. 2. No visible pneumothorax status post thoracentesis. Electronically Signed   By: Margaretha Sheffield M.D.   On: 11/07/2021 14:43   ECHOCARDIOGRAM COMPLETE  Result Date: 11/07/2021    ECHOCARDIOGRAM REPORT   Patient Name:   JOVON STREETMAN Date of Exam: 11/07/2021 Medical Rec #:  [id number]      Height:       65.5 in Accession #:    [id number]     Weight:       125.7 lb Date of Birth:  1956-05-09      BSA:          1.633 m Patient Age:    3 years       BP:           124/80 mmHg Patient Gender: F              HR:           78 bpm. Exam Location:  [facility] Procedure: 2D Echo, Cardiac Doppler and Color Doppler Indications:     Pulmonary Embolism I26.09  History:         Patient has no prior history of Echocardiogram examinations.                  Pulmonary emboli, acute pulmonary embolism.  Sonographer:     Sherrie Sport Referring Phys:  [id number] Lequita Halt Diagnosing Phys: Donnelly Angelica IMPRESSIONS  1. Left ventricular ejection  fraction, by estimation, is 60 to 65%. The left ventricle has normal function. The left ventricle has no regional wall motion abnormalities. Left ventricular diastolic parameters are consistent with Grade I diastolic dysfunction (impaired relaxation).  2. Right ventricular systolic function is normal. The right ventricular size is mildly enlarged. There is mildly elevated pulmonary artery systolic pressure. The estimated right ventricular systolic pressure is 32.9 mmHg.  3. Focal thickening of MV leaflet. Correlate clinically. . The mitral valve is degenerative. Mild mitral valve regurgitation. No evidence of mitral stenosis.  4. The aortic valve is normal in structure. Aortic valve regurgitation is not visualized. No aortic stenosis is present.  5. The inferior vena cava is normal in size with <50% respiratory variability, suggesting right atrial pressure of 8 mmHg. FINDINGS  Left Ventricle: Left ventricular ejection fraction, by estimation, is 60 to 65%. The left ventricle has normal function. The left ventricle has no regional wall motion abnormalities. The left ventricular internal cavity size was normal in size. There is  no left ventricular hypertrophy. Left ventricular diastolic parameters are consistent with Grade I diastolic dysfunction (impaired relaxation). Right Ventricle: The right  ventricular size is mildly enlarged. No increase in right ventricular wall thickness. Right ventricular systolic function is normal. There is mildly elevated pulmonary artery systolic pressure. The tricuspid regurgitant velocity is 2.81 m/s, and with an assumed right atrial pressure of 8 mmHg, the estimated right ventricular systolic pressure is 49.8 mmHg. Left Atrium: Left atrial size was normal in size. Right Atrium: Right atrial size was normal in size. Pericardium: There is no evidence of pericardial effusion. Mitral Valve: Focal thickening of MV leaflet. Correlate clinically. The mitral valve is degenerative in  appearance. Mild mitral valve regurgitation. No evidence of mitral valve stenosis. MV peak gradient, 6.7 mmHg. The mean mitral valve gradient is 3.0 mmHg. Tricuspid Valve: The tricuspid valve is normal in structure. Tricuspid valve regurgitation is mild . No evidence of tricuspid stenosis. Aortic Valve: The aortic valve is normal in structure. Aortic valve regurgitation is not visualized. No aortic stenosis is present. Aortic valve mean gradient measures 3.0 mmHg. Aortic valve peak gradient measures 4.8 mmHg. Aortic valve area, by VTI measures 2.53 cm. Pulmonic Valve: The pulmonic valve was normal in structure. Pulmonic valve regurgitation is not visualized. No evidence of pulmonic stenosis. Aorta: The aortic root is normal in size and structure. Venous: The inferior vena cava is normal in size with less than 50% respiratory variability, suggesting right atrial pressure of 8 mmHg. IAS/Shunts: No atrial level shunt detected by color flow Doppler.  LEFT VENTRICLE PLAX 2D LVIDd:         3.70 cm   Diastology LVIDs:         2.30 cm   LV e' medial:    5.11 cm/s LV PW:         1.40 cm   LV E/e' medial:  15.9 LV IVS:        1.10 cm   LV e' lateral:   4.35 cm/s LVOT diam:     2.00 cm   LV E/e' lateral: 18.7 LV SV:         52 LV SV Index:   32 LVOT Area:     3.14 cm  RIGHT VENTRICLE RV Basal diam:  2.90 cm RV S prime:     13.10 cm/s TAPSE (M-mode): 2.1 cm LEFT ATRIUM             Index        RIGHT ATRIUM           Index LA diam:        2.50 cm 1.53 cm/m   RA Area:     14.40 cm LA Vol (A2C):   55.7 ml 34.11 ml/m  RA Volume:   34.30 ml  21.01 ml/m LA Vol (A4C):   50.6 ml 30.99 ml/m LA Biplane Vol: 53.7 ml 32.89 ml/m  AORTIC VALVE                    PULMONIC VALVE AV Area (Vmax):    2.69 cm     PV Vmax:        0.70 m/s AV Area (Vmean):   2.50 cm     PV Vmean:       47.133 cm/s AV Area (VTI):     2.53 cm     PV VTI:         0.128 m AV Vmax:           109.00 cm/s  PV Peak grad:   1.9 mmHg AV Vmean:  74.700 cm/s   PV Mean grad:   1.0 mmHg AV VTI:            0.206 m      RVOT Peak grad: 3 mmHg AV Peak Grad:      4.8 mmHg AV Mean Grad:      3.0 mmHg LVOT Vmax:         93.30 cm/s LVOT Vmean:        59.400 cm/s LVOT VTI:          0.166 m LVOT/AV VTI ratio: 0.81  AORTA Ao Root diam: 3.17 cm MITRAL VALVE                TRICUSPID VALVE MV Area (PHT): 3.17 cm     TR Peak grad:   31.6 mmHg MV Area VTI:   1.83 cm     TR Vmax:        281.00 cm/s MV Peak grad:  6.7 mmHg MV Mean grad:  3.0 mmHg     SHUNTS MV Vmax:       1.29 m/s     Systemic VTI:  0.17 m MV Vmean:      75.9 cm/s    Systemic Diam: 2.00 cm MV Decel Time: 239 msec     Pulmonic VTI:  0.158 m MV E velocity: 81.40 cm/s MV A velocity: 109.00 cm/s MV E/A ratio:  0.75 Donnelly Angelica Electronically signed by Donnelly Angelica Signature Date/Time: 11/07/2021/1:06:16 PM    Final    US THORACENTESIS ASP PLEURAL SPACE W/IMG GUIDE  Result Date: 11/07/2021 INDICATION: 67 year old with right lung mass and concern for metastatic disease. EXAM: ULTRASOUND GUIDED RIGHT THORACENTESIS MEDICATIONS: None. COMPLICATIONS: None immediate. PROCEDURE: An ultrasound guided thoracentesis was thoroughly discussed with the patient and questions answered. The benefits, risks, alternatives and complications were also discussed. The patient understands and wishes to proceed with the procedure. Written consent was obtained. Ultrasound was performed to localize and mark an adequate pocket of fluid in the right chest. The area was then prepped and draped in the normal sterile fashion. 1% Lidocaine was used for local anesthesia. Under ultrasound guidance a 6 Fr Safe-T-Centesis catheter was introduced. Thoracentesis was performed. The catheter was removed and a dressing applied. FINDINGS: A total of approximately 350 mL of amber colored fluid was removed. Samples were sent to the laboratory as requested by the clinical team. IMPRESSION: Successful ultrasound guided right thoracentesis yielding 350 mL of pleural fluid.  Electronically Signed   By: Markus Daft M.D.   On: 11/07/2021 15:01    Assessment and plan-   Patient is a 66 y.o. female with known DVT on Eliquis presented with chest pain and unsteady gait.  Initial work-up showed pulmonary embolism, right pleural effusion, lung mass with skeletal liver lesions.  #Acute pulmonary embolism This occurred while on anticoagulation with Eliquis.  Eliquis failure is questionable, although difficult to rule out,  with the reported history of " missed 1 or 2 doses",  and the fact that Eliquis appears to be effective in improving her LLE DVT symptoms, I wonder PE may have existed when she developed a DVT.  Patient most likely have hypercoagulable state due to underlying malignancy.  I agree with continue heparin GTT. Discussed with vascular surgeon Dr. Delana Meyer who recommends mechanical colectomy today and IVC placement-procedure is this afternoon. Given the possible history of Eliquis failure, to be on the safe side, I recommend transition to Xarelto at discharge.  #Right lung mass, right pleural effusion, multiple  skeletal and liver lesions.  Status post thoracentesis, awaiting cytology report and fluid analysis. Suspecting stage IV lung cancer.  Recommend tissue diagnosis, either IR biopsy of the liver lesion if feasible, and if not feasible,  Appreciate pulmonology recommendation of biopsy via bronchoscopy. Recommend inpatient procedure in order to minimally disrupt her anticoagulation.  #Lightheadedness unsteady gait, falls Recommend MRI brain with and without contrast.  Thank you for allowing me to participate in the care of this patient.    Earlie Server, MD, PhD Hematology Oncology  11/08/2021

## 2021-11-08 NOTE — Interval H&P Note (Signed)
History and Physical Interval Note:  11/08/2021 5:29 PM  Cheryl Schroeder  has presented today for surgery, with the diagnosis of PE/DVT.  The various methods of treatment have been discussed with the patient and family. After consideration of risks, benefits and other options for treatment, the patient has consented to  Procedure(s): PULMONARY THROMBECTOMY (Left) as a surgical intervention.  The patient's history has been reviewed, patient examined, no change in status, stable for surgery.  I have reviewed the patient's chart and labs.  Questions were answered to the patient's satisfaction.     Hortencia Pilar

## 2021-11-08 NOTE — Consult Note (Signed)
ANTICOAGULATION CONSULT NOTE - Initial Consult  Pharmacy Consult for Heparin dosing Indication: pulmonary embolus  Allergies  Allergen Reactions   Sulfa Antibiotics Nausea Only and Nausea And Vomiting    Patient Measurements: Height: 5' 5.51" (166.4 cm) Weight: 57 kg (125 lb 10.6 oz) IBW/kg (Calculated) : 58.18 Heparin Dosing Weight: 52.2kg  Vital Signs: Temp: 98.5 F (36.9 C) (02/02 1948) Temp Source: Oral (02/02 1948) BP: 123/71 (02/02 1948) Pulse Rate: 75 (02/03 0600)  Labs: Recent Labs    11/06/21 1257 11/06/21 1533 11/06/21 2301 11/07/21 0412 11/07/21 0435 11/07/21 0955 11/08/21 0556  HGB 12.1  --   --   --   --   --  10.6*  HCT 38.0  --   --   --   --   --  33.0*  PLT 139*  --   --   --   --   --  163  APTT 32  --    < > 67*  --  68* 67*  LABPROT 16.2*  --   --   --   --   --   --   INR 1.3*  --   --   --   --   --   --   HEPARINUNFRC  --  >1.10*  --  >1.10*  --   --  0.60  CREATININE 0.85  --   --  0.97  --   --   --   TROPONINIHS 396*  --   --   --  428*  --  188*   < > = values in this interval not displayed.     Estimated Creatinine Clearance: 51.3 mL/min (by C-G formula based on SCr of 0.97 mg/dL).   Medical History: Past Medical History:  Diagnosis Date   Bipolar 2 disorder ([place])    Dysplastic nevus    R calf, txted in past, pt states ~2002   Fibromyalgia    Psoriasis     Medications:  Apixaban 5mg  BID (for DVT lower extremity)  Assessment: patient is a 66 year old female recently diagnosed with a DVT about 3 weeks ago who is on Eliquis who presents with some left upper abdominal pain and back pain for several weeks.CT positive for pulmonary emboli.  2/3 0656 HL 0.60, aPTT 67  -- Therapuetic  Goal of Therapy:  Heparin level 0.3-0.7 units/ml aPTT 66-102 seconds Monitor platelets by anticoagulation protocol: Yes   Plan:  Heparin remains Therapuetic. Continue heparin infusion at 1000 units/hr. HL and aPTT correlating. Will transition to  heparin level for future monitoring. Heparin level with AM labs CBC daily   Dorothe Pea, PharmD, BCPS Clinical Pharmacist   11/08/2021,7:16 AM

## 2021-11-08 NOTE — Op Note (Signed)
[facility]  Percutaneous Study/Intervention Procedural Note   Date of Surgery: 11/08/2021,4:51 PM  Surgeon:Kingsly Kloepfer, Dolores Lory   Pre-operative Diagnosis: Symptomatic pulmonary emboli with right heart strain and hypoxia  Post-operative diagnosis:  Same  Procedure(s) Performed:  1.  Inferior venacavogram  2.  Selective injection left subsegmental pulmonary arteries; left upper lobe and left lower lobe pulmonary arteries  3.  Placement of infrarenal IVC filter option Elite  4.  Mechanical thrombectomy left lobar pulmonary arteries for removal of pulmonary emboli using the Penumbra CAT 8 thrombectomy catheter.    Anesthesia: Conscious sedation was administered under my direct supervision by the interventional radiology RN. IV Versed plus fentanyl were utilized. Continuous ECG, pulse oximetry and blood pressure was monitored throughout the entire procedure.  Conscious sedation was administered for a total of 39 minutes and 20 seconds.  Sheath: 10 French 11 cm Pinnacle sheath antegrade right common femoral vein  Contrast: 60 cc   Fluoroscopy Time: 5.7 minutes  Indications:  Patient presented to the hospital with hypoxia and shortness of breath. The patient is unable to get out of bed and transfer to a chair without increased dyspnea. The patient's O2 saturations off nasal cannula are in the 80%. CT angiogram demonstrated bilateral pulmonary emboli associated with significant right heart strain. Given the long-term sequela and the patient's symptomatic condition the risks and benefits for angiography with thrombectomy are reviewed all questions are answered, the patient agrees to proceed.  Procedure:  Cheryl Schroeder a 66 y.o. female who was identified and appropriate procedural time out was performed.  The patient was then placed supine on the table and prepped and draped in the usual sterile fashion.  Ultrasound was used to evaluate the right common femoral vein.  It  was compressible indicating it is patent .  A digital ultrasound image was acquired for the permanent record.  A micropuncture needle was used to access the right common femoral vein under direct ultrasound guidance.  A microwire was then advanced under fluoroscopic guidance followed by micro-sheath.  A 0.035 J wire was advanced without resistance and a 5Fr sheath was placed.    4000 units of heparin was then given and allowed to circulate.  The J-wire and pigtail catheter was advanced up to the right atrium where a bolus injection contrast was used to demonstrate the pulmonary artery outflow.  Stiff angled Glidewire was then exchanged for the J-wire and the pigtail catheter was exchanged for JR4 catheter. Using the combination of the stiff angled Glidewire and the JR4 catheter the pulmonary outflow track was selected.  The left main pulmonary artery was evaluated first.  An Amplatz wire was then exchanged for the stiff angle Glidewire and the JR4 catheter was exchanged back to the pigtail catheter.  Then with the catheter in the left pulmonary artery bolus injection contrast was utilized to demonstrate the thrombus as well as the segmental pulmonary artery vasculature. This demonstrated thrombus in the distal left main pulmonary artery extending into the left upper lobe artery as well as the left lower lobe artery and noting that it was near occlusive.    The Amplatz wire was reintroduced through the pigtail catheter and the pigtail catheter removed.  The Penumbra Cat 8 extra torque catheter was then advanced into the thrombus in the left lower lobe pulmonary artery.  Hand-injection contrast was used to verify the positioning and evaluate the distal anatomy.  Mechanical aspiration was performed using the CAT 8 catheter and a separator.  After multiple passes the catheter was then repositioned into the left upper lobe pulmonary artery and again hand-injection contrast was performed to verify positioning and  evaluate the distal anatomy.  Multiple passes were made using mechanical aspiration in association with a separator.  Once there was free flow of blood from both lobar arteries the catheter was repositioned to the left main pulmonary artery.  Hand-injection contrast was then performed to give an assessment of the left pulmonary vasculature and the effectiveness of thrombectomy.  The penumbra catheter and wire were then removed from the pulmonary vasculature and an argon option Elite IVC filter was opened onto the field.  The 8 French delivery sheath for the filter was then advanced through the 10 French sheath over the wire and an image of the inferior vena cava was obtained.  After appropriate measurements and noting the bilateral renal blushes were at the L1-L2 level the option Elite filter was advanced up to the L3 level just below the renal blushes and deployed without difficulty over the wire.  Wire and delivery sheath were then removed  After review these images the catheter and 10 French sheath was removed and pressure held. There were no immediate complications.  Findings:   Left pulmonary: Initial imaging of the left pulmonary vasculature demonstrated thrombus within the distal left lower lobe and segmental branches.  There is also thrombus noted in the segmental branches of the upper lobe.  Only 1 segmental branch of the upper lobe appeared to be spared.  Following thrombectomy there is now near total filling of the left lung both upper and lower lobes.  Approximately 90% of the thrombus has been removed.  Inferior vena cava is widely patent renal blushes at the L1-L2 level.  Option Elite filter is deployed in the upright position in good orientation.    Disposition: Patient was taken to the recovery room in stable condition having tolerated the procedure well.  Cheryl Schroeder 11/08/2021,4:51 PM

## 2021-11-08 NOTE — Progress Notes (Signed)
Pulmonary Medicine          Date: 11/08/2021,   MRN# [id number] Cheryl Schroeder 1956-07-06       HISTORY OF PRESENT ILLNESS   Uneventful night. No new complaints, continued pain. No worsening sob. No fever or sweats. No bleeding.    PAST MEDICAL HISTORY   Past Medical History:  Diagnosis Date   Bipolar 2 disorder ([place])    Dysplastic nevus    R calf, txted in past, pt states ~2002   Fibromyalgia    Psoriasis      SURGICAL HISTORY   Past Surgical History:  Procedure Laterality Date   ABDOMINAL HYSTERECTOMY     ADENOIDECTOMY     BREAST BIOPSY Right 1990   cyst   TONSILLECTOMY       FAMILY HISTORY   Family History  Problem Relation Age of Onset   Breast cancer Maternal Aunt 72   Lung cancer Father      SOCIAL HISTORY   Social History   Tobacco Use   Smoking status: Never   Smokeless tobacco: Never  Vaping Use   Vaping Use: Never used  Substance Use Topics   Alcohol use: No   Drug use: No     MEDICATIONS    Home Medication:    Current Medication:  Current Facility-Administered Medications:    0.9 %  sodium chloride infusion, 250 mL, Intravenous, PRN, Wynetta Fines T, MD   acetaminophen (TYLENOL) tablet 650 mg, 650 mg, Oral, Q4H PRN, Wynetta Fines T, MD, 650 mg at 11/06/21 2019   ALPRAZolam (XANAX) tablet 0.5-1 mg, 0.5-1 mg, Oral, QHS PRN, Wynetta Fines T, MD   feeding supplement (ENSURE ENLIVE / ENSURE PLUS) liquid 237 mL, 237 mL, Oral, BID BM, Wynetta Fines T, MD, 237 mL at 11/07/21 1459   FLUoxetine (PROZAC) capsule 40 mg, 40 mg, Oral, Daily, [place], Ping T, MD, 40 mg at 11/07/21 0916   fluticasone (FLONASE) 50 MCG/ACT nasal spray 1 spray, 1 spray, Each Nare, Daily, Wynetta Fines T, MD   gabapentin (NEURONTIN) capsule 300 mg, 300 mg, Oral, TID, [place], Ping T, MD, 300 mg at 11/07/21 2057   heparin ADULT infusion 100 units/mL (25000 units/264mL), 1,000 Units/hr, Intravenous, Continuous, Wynetta Fines T, MD, Last Rate: 10 mL/hr at 11/07/21 1929,  1,000 Units/hr at 11/07/21 1929   HYDROcodone-acetaminophen (NORCO/VICODIN) 5-325 MG per tablet 1 tablet, 1 tablet, Oral, Q4H PRN, Wynetta Fines T, MD, 1 tablet at 11/06/21 2153   HYDROmorphone (DILAUDID) injection 0.5 mg, 0.5 mg, Intravenous, Q4H PRN, Hall, Carole N, DO, 0.5 mg at 11/07/21 2329   lamoTRIgine (LAMICTAL) tablet 200 mg, 200 mg, Oral, Daily, [place], Ping T, MD, 200 mg at 11/07/21 0355   multivitamin with minerals tablet 1 tablet, 1 tablet, Oral, Daily, Hall, Carole N, DO, 1 tablet at 11/07/21 1459   ondansetron (ZOFRAN) injection 4 mg, 4 mg, Intravenous, Q6H PRN, Wynetta Fines T, MD, 4 mg at 11/07/21 1535   oxyCODONE (Oxy IR/ROXICODONE) immediate release tablet 5 mg, 5 mg, Oral, Q4H PRN, Irene Pap N, DO, 5 mg at 11/07/21 2057   pantoprazole (PROTONIX) EC tablet 40 mg, 40 mg, Oral, Daily, Wynetta Fines T, MD, 40 mg at 11/07/21 0916   pravastatin (PRAVACHOL) tablet 40 mg, 40 mg, Oral, QHS, Wynetta Fines T, MD, 40 mg at 11/07/21 2057   sodium chloride flush (NS) 0.9 % injection 3 mL, 3 mL, Intravenous, Q12H, Wynetta Fines T, MD, 3 mL at 11/07/21 2104   sodium chloride  flush (NS) 0.9 % injection 3 mL, 3 mL, Intravenous, PRN, Lequita Halt, MD    ALLERGIES   Sulfa antibiotics     REVIEW OF SYSTEMS    Review of Systems:  Gen:  Denies  fever, sweats, chills weigh loss  HEENT: Denies blurred vision, double vision, ear pain, eye pain, hearing loss, nose bleeds, sore throat Cardiac:  No dizziness, chest pain or heaviness, chest tightness,edema Resp:   Denies cough or sputum porduction, shortness of breath,wheezing, hemoptysis,  Gi: Denies swallowing difficulty, stomach pain, nausea or vomiting, diarrhea, constipation, bowel incontinence Gu:  Denies bladder incontinence, burning urine Ext:   Denies Joint pain, stiffness or swelling Skin: Denies  skin rash, easy bruising or bleeding or hives Endoc:  Denies polyuria, polydipsia , polyphagia or weight change Psych:   Denies depression,  insomnia or hallucinations   Other:  All other systems negative   VS: BP 126/76 (BP Location: Left Arm)    Pulse 78    Temp 98.4 F (36.9 C) (Oral)    Resp 19    Ht 5' 5.51" (1.664 m)    Wt 57 kg    SpO2 92%    BMI 20.59 kg/m      PHYSICAL EXAM    GENERAL:NAD, no fevers, chills, no weakness no fatigue HEAD: Normocephalic, atraumatic.  EYES: Pupils equal, round, reactive to light. Extraocular muscles intact. No scleral icterus.  MOUTH: Moist mucosal membrane. Dentition intact. No abscess noted.  EAR, NOSE, THROAT: Clear without exudates. No external lesions.  NECK: Supple. No thyromegaly. No nodules. No JVD.  PULMONARY: no wheezing or rub CARDIOVASCULAR: S1 and S2. Regular rate and rhythm. No murmurs, rubs, or gallops. No edema. Pedal pulses 2+ bilaterally.  GASTROINTESTINAL: Soft, nontender, nondistended. No masses. Positive bowel sounds. No hepatosplenomegaly.  MUSCULOSKELETAL: No swelling, clubbing, or edema. Range of motion full in all extremities.  NEUROLOGIC: Cranial nerves II through XII are intact. No gross focal neurological deficits. Sensation intact. Reflexes intact.  SKIN: No ulceration, lesions, rashes, or cyanosis. Skin warm and dry. Turgor intact.  PSYCHIATRIC: Mood, affect within normal limits. The patient is awake, alert and oriented x 3. Insight, judgment intact.       IMAGING    DG Chest 2 View  Result Date: 10/22/2021 CLINICAL DATA:  Contusion of the rib on the left side. Left rib pain. EXAM: CHEST - 2 VIEW COMPARISON:  None. FINDINGS: The heart size and mediastinal contours are within normal limits. Soft tissue density mass with area of atelectasis in the right mid lung measuring approximately 3.0 x 4.9 cm. Bibasilar atelectasis. Hyperinflated lungs. No pleural effusion or pneumothorax. The visualized skeletal structures are unremarkable. IMPRESSION: 1. Soft tissue density mass in the right mid lung measuring at least 3.0 x 4.9 cm, it may represent pneumonia,  loculated pleural effusion or pulmonary mass. Further evaluation with CT examination would be helpful. Follow-up to resolution is recommended. 2. Hyperinflated lungs concerning for COPD. Bibasilar atelectasis. No pleural effusion or pneumothorax. An attempt was made to reach out to the provider Dr. Clemmie Krill without response from our office at the time of dictation. Electronically Signed   By: Keane Police D.O.   On: 10/22/2021 16:07   DG Ribs Unilateral W/Chest Left  Result Date: 11/06/2021 CLINICAL DATA:  Fall, left rib pain EXAM: LEFT RIBS AND CHEST - 3+ VIEW COMPARISON:  Chest x-ray 10/22/2021 FINDINGS: No acute fracture identified in the left ribs. Cardiomediastinal silhouette is unchanged. Persistent ovoid masslike opacity adjacent to the fissure  in the right lung measuring approximately 5 x 3.2 cm, grossly unchanged. Linear opacities in the left lower lung zone likely represent subsegmental atelectasis. Hyperinflated lungs with mildly prominent interstitial lung markings. No pleural effusion or pneumothorax visualized. IMPRESSION: 1. No left rib fractures identified. 2. Persistent and unchanged ovoid perifissural masslike opacity in the right lung. 3. COPD. Electronically Signed   By: Ofilia Neas M.D.   On: 11/06/2021 12:16   CT Angio Chest PE W and/or Wo Contrast  Result Date: 11/06/2021 CLINICAL DATA:  Chest pain, recent trauma EXAM: CT ANGIOGRAPHY CHEST WITH CONTRAST TECHNIQUE: Multidetector CT imaging of the chest was performed using the standard protocol during bolus administration of intravenous contrast. Multiplanar CT image reconstructions and MIPs were obtained to evaluate the vascular anatomy. RADIATION DOSE REDUCTION: This exam was performed according to the departmental dose-optimization program which includes automated exposure control, adjustment of the mA and/or kV according to patient size and/or use of iterative reconstruction technique. CONTRAST:  125mL OMNIPAQUE IOHEXOL 350 MG/ML  SOLN COMPARISON:  Chest radiographs done earlier today FINDINGS: Cardiovascular: There is homogeneous enhancement in the thoracic aorta. There is mild ectasia of main pulmonary artery measuring 3.2 cm. There is saddle embolus in the left main pulmonary artery extending into the left upper lobe and left lower lobe branches. There are few small filling defects in the subsegmental branches in the right middle lobe and right lower lobe. There are no imaging signs of right ventricular strain. Mediastinum/Nodes: No significant lymphadenopathy seen. There is 2.5 cm low-density lesion in the enlarged left lobe of thyroid. Lungs/Pleura: There is moderate right pleural effusion. There are small patchy infiltrates in the medial aspect of right upper lobe in the right upper lung fields. There is 5.2 x 4.1 cm homogeneous density inseparable from interlobar fissure in the anterior right parahilar region. There is narrowing of the lumen of the bronchus leading to this homogeneous opacity in the anteromedial right mid lung fields. Linear patchy infiltrates are seen in both lower lung fields, more so on the right side. In image 13 of series 6, there is 2 cm ground-glass density in the anterior left apex. There is moderate right pleural effusion. There is no significant left pleural effusion. There is no pneumothorax. Upper Abdomen: Fatty liver. Musculoskeletal: There are numerous lytic lesions of varying sizes in the left ribs, thoracic spine and lumbar spine. There is decrease in height of bodies of C6 vertebra. There is also decrease in height of bodies of T6, T7, T8 and T9 vertebrae. Review of the MIP images confirms the above findings. IMPRESSION: There is evidence of acute pulmonary embolism with small to moderate thrombus burden. There are no imaging signs of acute right ventricular strain. There is no evidence of thoracic aortic dissection. There is 4.8 cm homogeneous opacity in the anterior right mid lung fields. This lesion  has to be considered primary malignant neoplasm until proven otherwise. There is narrowing of bronchus leading to this parahilar density. PET-CT and biopsy as warranted should be considered. There are numerous lytic lesions of varying sizes along with compression fractures in the cervical and thoracic spine as described in the body of the report consistent with extensive skeletal metastatic disease. Moderate right pleural effusion. There are ground-glass and patchy alveolar infiltrates in both lungs as described in the body of the report suggesting multifocal pneumonia. There is 2.5 cm low-density nodule in the left lobe of thyroid. When the patient's clinical condition permits, thyroid sonogram may be considered. Electronically Signed  By: Elmer Picker M.D.   On: 11/06/2021 15:21   CT ABDOMEN PELVIS W CONTRAST  Result Date: 11/06/2021 CLINICAL DATA:  Abdominal pain, acute, nonlocalized. Status post fall EXAM: CT ABDOMEN AND PELVIS WITH CONTRAST TECHNIQUE: Multidetector CT imaging of the abdomen and pelvis was performed using the standard protocol following bolus administration of intravenous contrast. RADIATION DOSE REDUCTION: This exam was performed according to the departmental dose-optimization program which includes automated exposure control, adjustment of the mA and/or kV according to patient size and/or use of iterative reconstruction technique. CONTRAST:  172mL OMNIPAQUE IOHEXOL 350 MG/ML SOLN COMPARISON:  None. FINDINGS: Lower chest: Right pleural effusion. Please see separately dictated CT angiography chest 11/06/2021. Ports and Devices: None. Liver: Not enlarged. Several vague hypodense right hepatic lobe lesions measuring 0.9, 1.4, 0.7, 0.5 cm. No laceration or subcapsular hematoma. Biliary System: The gallbladder is otherwise unremarkable with no radio-opaque gallstones. No biliary ductal dilatation. Pancreas: Normal pancreatic contour. No main pancreatic duct dilatation. Spleen: Not  enlarged. No focal lesion. No laceration, subcapsular hematoma, or vascular injury. Adrenal Glands: No nodularity bilaterally. Kidneys: Bilateral kidneys enhance symmetrically. No hydronephrosis. Subcentimeter hypodensities are too small characterize. A 1.1 cm vague wedge-shaped hypodensity along the left superior renal pole that likely represents a poorly defined lesion with a density of 45 Hounsfield units. No contusion, laceration, or subcapsular hematoma. No injury to the vascular structures or collecting systems. No hydroureter. The urinary bladder is unremarkable. Bowel: No small or large bowel wall thickening or dilatation. Non-visualization of the appendix. The appendix is not definitely identified with no inflammatory changes in the right lower quadrant to suggest acute appendicitis. Mesentery, Omentum, and Peritoneum: No simple free fluid ascites. No pneumoperitoneum. No hemoperitoneum. No mesenteric hematoma identified. No organized fluid collection. Pelvic Organs: Normal. Lymph Nodes: No abdominal, pelvic, inguinal lymphadenopathy. Vasculature: No abdominal aorta or iliac aneurysm. No active contrast extravasation or pseudoaneurysm. Musculoskeletal: No significant soft tissue hematoma. No acute pelvic fracture. No spinal fracture. Diffuse appendicular and axial skeleton lytic lesions. IMPRESSION: 1.  No acute traumatic injury to the abdomen, or pelvis. 2. No acute fracture or traumatic malalignment of the lumbar spine. 3. Several vague indeterminate hypodense right hepatic lobe lesions measuring 0.9, 1.4, 0.7, 0.5 cm. Findings could represent metastases. 4. 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 5. An indeterminate 1.1 cm vague slightly wedge-shaped hypodensity along the left superior renal pole. Finding could represent an underlying poorly defined lesion, small infarction, less likely focal pyelonephritis. 6. Please see separately dictated CT angiography chest 11/06/2021.  Electronically Signed   By: Iven Finn M.D.   On: 11/06/2021 15:15   DG Chest Port 1 View  Result Date: 11/07/2021 CLINICAL DATA:  Status post thorenthesis EXAM: PORTABLE CHEST 1 VIEW COMPARISON:  Chest radiograph 10/22/2021. Chest CT November 06, 2021. FINDINGS: Similar versus mildly increased right midlung masslike opacity. There is some fluid tracking along the right minor fissure. No visible pneumothorax. No consolidation. Similar cardiomediastinal silhouette. IMPRESSION: 1. Similar versus mildly increased right midlung masslike opacity, further characterized on recent chest CT. There is some fluid tracking along the right minor fissure. 2. No visible pneumothorax status post thoracentesis. Electronically Signed   By: Margaretha Sheffield M.D.   On: 11/07/2021 14:43   ECHOCARDIOGRAM COMPLETE  Result Date: 11/07/2021    ECHOCARDIOGRAM REPORT   Patient Name:   Cheryl Schroeder Date of Exam: 11/07/2021 Medical Rec #:  [id number]      Height:       65.5 in Accession #:  [id number]     Weight:       125.7 lb Date of Birth:  Oct 29, 1955      BSA:          1.633 m Patient Age:    34 years       BP:           124/80 mmHg Patient Gender: F              HR:           78 bpm. Exam Location:  [facility] Procedure: 2D Echo, Cardiac Doppler and Color Doppler Indications:     Pulmonary Embolism I26.09  History:         Patient has no prior history of Echocardiogram examinations.                  Pulmonary emboli, acute pulmonary embolism.  Sonographer:     Sherrie Sport Referring Phys:  [id number] Lequita Halt Diagnosing Phys: Donnelly Angelica IMPRESSIONS  1. Left ventricular ejection fraction, by estimation, is 60 to 65%. The left ventricle has normal function. The left ventricle has no regional wall motion abnormalities. Left ventricular diastolic parameters are consistent with Grade I diastolic dysfunction (impaired relaxation).  2. Right ventricular systolic function is normal. The right ventricular size is mildly enlarged. There is  mildly elevated pulmonary artery systolic pressure. The estimated right ventricular systolic pressure is 85.0 mmHg.  3. Focal thickening of MV leaflet. Correlate clinically. . The mitral valve is degenerative. Mild mitral valve regurgitation. No evidence of mitral stenosis.  4. The aortic valve is normal in structure. Aortic valve regurgitation is not visualized. No aortic stenosis is present.  5. The inferior vena cava is normal in size with <50% respiratory variability, suggesting right atrial pressure of 8 mmHg. FINDINGS  Left Ventricle: Left ventricular ejection fraction, by estimation, is 60 to 65%. The left ventricle has normal function. The left ventricle has no regional wall motion abnormalities. The left ventricular internal cavity size was normal in size. There is  no left ventricular hypertrophy. Left ventricular diastolic parameters are consistent with Grade I diastolic dysfunction (impaired relaxation). Right Ventricle: The right ventricular size is mildly enlarged. No increase in right ventricular wall thickness. Right ventricular systolic function is normal. There is mildly elevated pulmonary artery systolic pressure. The tricuspid regurgitant velocity is 2.81 m/s, and with an assumed right atrial pressure of 8 mmHg, the estimated right ventricular systolic pressure is 27.7 mmHg. Left Atrium: Left atrial size was normal in size. Right Atrium: Right atrial size was normal in size. Pericardium: There is no evidence of pericardial effusion. Mitral Valve: Focal thickening of MV leaflet. Correlate clinically. The mitral valve is degenerative in appearance. Mild mitral valve regurgitation. No evidence of mitral valve stenosis. MV peak gradient, 6.7 mmHg. The mean mitral valve gradient is 3.0 mmHg. Tricuspid Valve: The tricuspid valve is normal in structure. Tricuspid valve regurgitation is mild . No evidence of tricuspid stenosis. Aortic Valve: The aortic valve is normal in structure. Aortic valve  regurgitation is not visualized. No aortic stenosis is present. Aortic valve mean gradient measures 3.0 mmHg. Aortic valve peak gradient measures 4.8 mmHg. Aortic valve area, by VTI measures 2.53 cm. Pulmonic Valve: The pulmonic valve was normal in structure. Pulmonic valve regurgitation is not visualized. No evidence of pulmonic stenosis. Aorta: The aortic root is normal in size and structure. Venous: The inferior vena cava is normal in size with less than 50% respiratory variability, suggesting right atrial  pressure of 8 mmHg. IAS/Shunts: No atrial level shunt detected by color flow Doppler.  LEFT VENTRICLE PLAX 2D LVIDd:         3.70 cm   Diastology LVIDs:         2.30 cm   LV e' medial:    5.11 cm/s LV PW:         1.40 cm   LV E/e' medial:  15.9 LV IVS:        1.10 cm   LV e' lateral:   4.35 cm/s LVOT diam:     2.00 cm   LV E/e' lateral: 18.7 LV SV:         52 LV SV Index:   32 LVOT Area:     3.14 cm  RIGHT VENTRICLE RV Basal diam:  2.90 cm RV S prime:     13.10 cm/s TAPSE (M-mode): 2.1 cm LEFT ATRIUM             Index        RIGHT ATRIUM           Index LA diam:        2.50 cm 1.53 cm/m   RA Area:     14.40 cm LA Vol (A2C):   55.7 ml 34.11 ml/m  RA Volume:   34.30 ml  21.01 ml/m LA Vol (A4C):   50.6 ml 30.99 ml/m LA Biplane Vol: 53.7 ml 32.89 ml/m  AORTIC VALVE                    PULMONIC VALVE AV Area (Vmax):    2.69 cm     PV Vmax:        0.70 m/s AV Area (Vmean):   2.50 cm     PV Vmean:       47.133 cm/s AV Area (VTI):     2.53 cm     PV VTI:         0.128 m AV Vmax:           109.00 cm/s  PV Peak grad:   1.9 mmHg AV Vmean:          74.700 cm/s  PV Mean grad:   1.0 mmHg AV VTI:            0.206 m      RVOT Peak grad: 3 mmHg AV Peak Grad:      4.8 mmHg AV Mean Grad:      3.0 mmHg LVOT Vmax:         93.30 cm/s LVOT Vmean:        59.400 cm/s LVOT VTI:          0.166 m LVOT/AV VTI ratio: 0.81  AORTA Ao Root diam: 3.17 cm MITRAL VALVE                TRICUSPID VALVE MV Area (PHT): 3.17 cm     TR Peak  grad:   31.6 mmHg MV Area VTI:   1.83 cm     TR Vmax:        281.00 cm/s MV Peak grad:  6.7 mmHg MV Mean grad:  3.0 mmHg     SHUNTS MV Vmax:       1.29 m/s     Systemic VTI:  0.17 m MV Vmean:      75.9 cm/s    Systemic Diam: 2.00 cm MV Decel Time: 239 msec     Pulmonic VTI:  0.158 m MV  E velocity: 81.40 cm/s MV A velocity: 109.00 cm/s MV E/A ratio:  0.75 Donnelly Angelica Electronically signed by Donnelly Angelica Signature Date/Time: 11/07/2021/1:06:16 PM    Final    US THORACENTESIS ASP PLEURAL SPACE W/IMG GUIDE  Result Date: 11/07/2021 INDICATION: 66 year old with right lung mass and concern for metastatic disease. EXAM: ULTRASOUND GUIDED RIGHT THORACENTESIS MEDICATIONS: None. COMPLICATIONS: None immediate. PROCEDURE: An ultrasound guided thoracentesis was thoroughly discussed with the patient and questions answered. The benefits, risks, alternatives and complications were also discussed. The patient understands and wishes to proceed with the procedure. Written consent was obtained. Ultrasound was performed to localize and mark an adequate pocket of fluid in the right chest. The area was then prepped and draped in the normal sterile fashion. 1% Lidocaine was used for local anesthesia. Under ultrasound guidance a 6 Fr Safe-T-Centesis catheter was introduced. Thoracentesis was performed. The catheter was removed and a dressing applied. FINDINGS: A total of approximately 350 mL of amber colored fluid was removed. Samples were sent to the laboratory as requested by the clinical team. IMPRESSION: Successful ultrasound guided right thoracentesis yielding 350 mL of pleural fluid. Electronically Signed   By: Markus Daft M.D.   On: 11/07/2021 15:01      ASSESSMENT/PLAN   This is a pleasant, unfortunate lady of faith who presents with known dvt, sob, lower chest pain was placed on eliqius, 23 weeks ago. She  came in yesterday with dizziness and falling. 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    Presently not having respiratory distress. Not on oxygen, vital signs stable, no hemoptysis. On iv heparin   It would not be unreasonable to suspect the pe was probable there at the dx of dvt . She her dyspnea is not much worse x 3 weeks.    Malignacy w/u -oncology consult -pet scan, out patient -awaiting cytology report on pleural fluid -pursue tissue  dx ( bone, liver  vs lung bx, she want to limit invasive procedures)   Pulmonary/right dvt/common femoral vein clot. Most likely due to hypercoagulable state from what looks like malignancy -switch to eliquis on discharge ( after thoracentesis, if enough fluid id there) -since there is the notion that she may have failed anticoagulation, will recommend ivc filter and eliquis ( vascular consulte). After which she can go home with cintinued out patient work up. .             Thank you for allowing me to participate in the care of this patient.   Patient/Family are satisfied with care plan and all questions have been answered.  This document was prepared using Dragon voice recognition software and may include unintentional dictation errors.     Wallene Huh, M.D.  Division of [place]

## 2021-11-08 NOTE — Consult Note (Signed)
ANTICOAGULATION CONSULT NOTE   Pharmacy Consult for Heparin dosing Indication: pulmonary embolus  Allergies  Allergen Reactions   Sulfa Antibiotics Nausea Only and Nausea And Vomiting    Patient Measurements: Height: 5' 5.51" (166.4 cm) Weight: 57 kg (125 lb 10.6 oz) IBW/kg (Calculated) : 58.18 Heparin Dosing Weight: 52.2kg  Vital Signs: Temp: 98.5 F (36.9 C) (02/03 1538) Temp Source: Oral (02/03 1538) BP: 117/72 (02/03 1649) Pulse Rate: 82 (02/03 1538)  Labs: Recent Labs    11/06/21 1257 11/06/21 1533 11/06/21 2301 11/07/21 0412 11/07/21 0435 11/07/21 0955 11/08/21 0556  HGB 12.1  --   --   --   --   --  10.6*  HCT 38.0  --   --   --   --   --  33.0*  PLT 139*  --   --   --   --   --  163  APTT 32  --    < > 67*  --  68* 67*  LABPROT 16.2*  --   --   --   --   --   --   INR 1.3*  --   --   --   --   --   --   HEPARINUNFRC  --  >1.10*  --  >1.10*  --   --  0.60  CREATININE 0.85  --   --  0.97  --   --  0.89  TROPONINIHS 396*  --   --   --  428*  --  188*   < > = values in this interval not displayed.     Estimated Creatinine Clearance: 56 mL/min (by C-G formula based on SCr of 0.89 mg/dL).   Medical History: Past Medical History:  Diagnosis Date   Bipolar 2 disorder ([place])    Dysplastic nevus    R calf, txted in past, pt states ~2002   Fibromyalgia    Psoriasis     Medications:  Apixaban 5mg  BID (for DVT lower extremity)  Assessment: patient is a 66 year old female recently diagnosed with a DVT about 3 weeks ago who is on Eliquis who presents with some left upper abdominal pain and back pain for several weeks.CT positive for pulmonary emboli.  2/3 0656 HL 0.60, aPTT 67  -- Therapuetic  Goal of Therapy:  Heparin level 0.3-0.7 units/ml aPTT 66-102 seconds Monitor platelets by anticoagulation protocol: Yes   Plan:  Pt had thrombectomy and Heparin drip stopped.  Plan to resume heparin infusion at 1000 units/hr at 1945 (see consult).  HL and aPTT  correlating. Will transition to heparin level for future monitoring.  Heparin level 6 hrs after restart of drip CBC daily   Noralee Space, PharmD Clinical Pharmacist   11/08/2021,5:01 PM

## 2021-11-08 NOTE — Progress Notes (Signed)
PROGRESS NOTE  Cheryl Schroeder FXT:[id number] DOB: [birth year] DOA: 11/06/2021 PCP: Lynnell Jude, MD  HPI/Recap of past 24 hours: Cheryl Schroeder is a 66 y.o. female with medical history significant of recently diagnosed left leg DVT 3 weeks ago on Eliquis, fibromyalgia, bipolar disorder, HLD, chronic peripheral neuropathy, presented with worsening of chest pains, and frequent falls.  Work-up revealed saddle pulmonary embolism and elevated troponin.  Also revealed lung mass concerning for malignancy with metastasis to liver and bone, seen on CT scan.  Also found to have a right pleural effusion, post right thoracentesis, pathology of pleural fluid is pending.  Vascular surgery, pulmonary, and medical oncology consulted.  11/08/2021: Patient was seen at her bedside.  Her sister and friend were present in the room.  Reports rib cage pain 7 out of 10 prior to pain medication administration and worse when she moves.  Assessment/Plan: Principal Problem:   Pulmonary emboli (HCC) Active Problems:   Acute pulmonary embolism (HCC)  Submassive PE, on heparin drip -Discussed with pulmonology Dr. Raul Del, patient has been taking Eliquis for the past 3 weeks, and the radiology report of PE is acute.  The question is whether this represent therapeutic failure of Eliquis?  Will defer decision to vascular surgery. High risk with elevated troponin.  BNP is normal. 2D echo done on 11/07/2021 showed normal LVEF 60 to 65%. Continue heparin drip until no further procedures are planned.  Recent left lower extremity DVT, diagnosed on 10/08/2021 Patient was on Eliquis prior to admission there was found to have a PE on admission Plan for IVC filter by vascular surgery Defer anticoagulation therapy and choice of anticoagulant to vascular surgery   Right lung mass with possible liver and bone metastasis -Stage IV lung cancer by definition, discussed with pulmonology regarding biopsy plan.  Outpatient biopsy is hard to  arrange due to patient has to be on continuous anticoagulation. Pulmonology will talk to IR for possible rib biopsy. Follow-up pleural effusion pathology. Oncology, Dr. Tasia Catchings consulted.   Right-sided pleural effusion status post right thoracentesis on 11/07/2021 with 350 cc of fluid removed -Likely malignant from the lung mass on the same side. Follow fluid analysis and pathology from thoracentesis.  Chronic peripheral neuropathy -Continue gabapentin   Bipolar disorder -Continue lamotrigine   Severe protein calorie malnutrition -BMI= 20, patient reports recent weight loss Continue to encourage increase in oral protein calorie intake   DVT prophylaxis: Heparin drip Code Status: Full code Family Communication: Sister at bedside, updated. Disposition Plan: Discharge when okay with pulmonary, vascular surgery and oncology. Consults called: Pulmonology, vascular surgery, medical oncology. Admission status: Inpatient.     Status is: Inpatient Patient requires at least 2 midnights for further evaluation and treatment of the present condition.            Objective: Vitals:   11/08/21 0600 11/08/21 0737 11/08/21 0814 11/08/21 1538  BP:  119/79 126/76 137/60  Pulse: 75 79 78 82  Resp: 20 18 19 13   Temp:  98.5 F (36.9 C) 98.4 F (36.9 C) 98.5 F (36.9 C)  TempSrc:   Oral Oral  SpO2:  97% 92% 95%  Weight:      Height:        Intake/Output Summary (Last 24 hours) at 11/08/2021 1555 Last data filed at 11/08/2021 0900 Gross per 24 hour  Intake 258.39 ml  Output --  Net 258.39 ml   Filed Weights   11/06/21 1138 11/06/21 2040 11/07/21 0413  Weight: 52.2 kg 55.5  kg 57 kg    Exam:  General: 66 y.o. year-old female frail-appearing no acute distress.  She is alert and oriented x3.   Cardiovascular: Regular rate and rhythm no rubs or gallops.   Respiratory: Clear to auscultation no wheezes or rales.   Abdomen: Soft with normal bowel sounds present.   Musculoskeletal: No  lower extremity edema bilaterally. Skin: No ulcerative lesions noted. Psychiatry: Mood is appropriate for condition and setting.   Data Reviewed: CBC: Recent Labs  Lab 11/06/21 1257 11/08/21 0556  WBC 9.9 8.2  NEUTROABS 7.8*  --   HGB 12.1 10.6*  HCT 38.0 33.0*  MCV 93.4 93.2  PLT 139* 099   Basic Metabolic Panel: Recent Labs  Lab 11/06/21 1257 11/07/21 0412 11/08/21 0556  NA 137 139 138  K 3.7 4.2 4.2  CL 103 107 104  CO2 25 25 27   GLUCOSE 154* 96 92  BUN 16 15 12   CREATININE 0.85 0.97 0.89  CALCIUM 10.0 9.2 9.4  MG  --   --  2.1  PHOS  --   --  4.2   GFR: Estimated Creatinine Clearance: 56 mL/min (by C-G formula based on SCr of 0.89 mg/dL). Liver Function Tests: Recent Labs  Lab 11/06/21 1257  AST 34  ALT 20  ALKPHOS 196*  BILITOT 0.8  PROT 8.1  ALBUMIN 4.0   Recent Labs  Lab 11/06/21 1257  LIPASE 35   No results for input(s): AMMONIA in the last 168 hours. Coagulation Profile: Recent Labs  Lab 11/06/21 1257  INR 1.3*   Cardiac Enzymes: No results for input(s): CKTOTAL, CKMB, CKMBINDEX, TROPONINI in the last 168 hours. BNP (last 3 results) No results for input(s): PROBNP in the last 8760 hours. HbA1C: No results for input(s): HGBA1C in the last 72 hours. CBG: No results for input(s): GLUCAP in the last 168 hours. Lipid Profile: No results for input(s): CHOL, HDL, LDLCALC, TRIG, CHOLHDL, LDLDIRECT in the last 72 hours. Thyroid Function Tests: No results for input(s): TSH, T4TOTAL, FREET4, T3FREE, THYROIDAB in the last 72 hours. Anemia Panel: No results for input(s): VITAMINB12, FOLATE, FERRITIN, TIBC, IRON, RETICCTPCT in the last 72 hours. Urine analysis: No results found for: COLORURINE, APPEARANCEUR, LABSPEC, PHURINE, GLUCOSEU, HGBUR, BILIRUBINUR, KETONESUR, PROTEINUR, UROBILINOGEN, NITRITE, LEUKOCYTESUR Sepsis Labs: @LABRCNTIP (procalcitonin:4,lacticidven:4)  ) Recent Results (from the past 240 hour(s))  Resp Panel by RT-PCR (Flu  A&B, Covid) Nasopharyngeal Swab     Status: None   Collection Time: 11/06/21  3:49 PM   Specimen: Nasopharyngeal Swab; Nasopharyngeal(NP) swabs in vial transport medium  Result Value Ref Range Status   SARS Coronavirus 2 by RT PCR NEGATIVE NEGATIVE Final    Comment: (NOTE) SARS-CoV-2 target nucleic acids are NOT DETECTED.  The SARS-CoV-2 RNA is generally detectable in upper respiratory specimens during the acute phase of infection. The lowest concentration of SARS-CoV-2 viral copies this assay can detect is 138 copies/mL. A negative result does not preclude SARS-Cov-2 infection and should not be used as the sole basis for treatment or other patient management decisions. A negative result may occur with  improper specimen collection/handling, submission of specimen other than nasopharyngeal swab, presence of viral mutation(s) within the areas targeted by this assay, and inadequate number of viral copies(<138 copies/mL). A negative result must be combined with clinical observations, patient history, and epidemiological information. The expected result is Negative.  Fact Sheet for Patients:  EntrepreneurPulse.com.au  Fact Sheet for Healthcare Providers:  IncredibleEmployment.be  This test is no t yet approved or cleared by  the [employer] and  has been authorized for detection and/or diagnosis of SARS-CoV-2 by FDA under an Emergency Use Authorization (EUA). This EUA will remain  in effect (meaning this test can be used) for the duration of the COVID-19 declaration under Section 564(b)(1) of the Act, 21 U.S.C.section 360bbb-3(b)(1), unless the authorization is terminated  or revoked sooner.       Influenza A by PCR NEGATIVE NEGATIVE Final   Influenza B by PCR NEGATIVE NEGATIVE Final    Comment: (NOTE) The Xpert Xpress SARS-CoV-2/FLU/RSV plus assay is intended as an aid in the diagnosis of influenza from Nasopharyngeal swab specimens  and should not be used as a sole basis for treatment. Nasal washings and aspirates are unacceptable for Xpert Xpress SARS-CoV-2/FLU/RSV testing.  Fact Sheet for Patients: EntrepreneurPulse.com.au  Fact Sheet for Healthcare Providers: IncredibleEmployment.be  This test is not yet approved or cleared by the [place] FDA and has been authorized for detection and/or diagnosis of SARS-CoV-2 by FDA under an Emergency Use Authorization (EUA). This EUA will remain in effect (meaning this test can be used) for the duration of the COVID-19 declaration under Section 564(b)(1) of the Act, 21 U.S.C. section 360bbb-3(b)(1), unless the authorization is terminated or revoked.  Performed at [facility], [address] [place], [place] [address]   CULTURE, BLOOD (ROUTINE X 2) w Reflex to ID Panel     Status: None (Preliminary result)   Collection Time: 11/07/21  4:37 PM   Specimen: BLOOD  Result Value Ref Range Status   Specimen Description BLOOD LEFT ANTECUBITAL  Final   Special Requests   Final    BOTTLES DRAWN AEROBIC AND ANAEROBIC Blood Culture adequate volume   Culture   Final    NO GROWTH < 24 HOURS Performed at [facility], [address], [place], [place] [address]    Report Status PENDING  Incomplete  CULTURE, BLOOD (ROUTINE X 2) w Reflex to ID Panel     Status: None (Preliminary result)   Collection Time: 11/07/21  4:38 PM   Specimen: BLOOD  Result Value Ref Range Status   Specimen Description BLOOD BLOOD LEFT ARM  Final   Special Requests   Final    BOTTLES DRAWN AEROBIC AND ANAEROBIC Blood Culture adequate volume   Culture   Final    NO GROWTH < 24 HOURS Performed at [facility], [address], [place], [place] [address]    Report Status PENDING  Incomplete      Studies: No results found.  Scheduled Meds:  [MAR Hold] feeding supplement  237 mL Oral BID BM   fentaNYL       [MAR Hold] FLUoxetine   40 mg Oral Daily   [MAR Hold] fluticasone  1 spray Each Nare Daily   [MAR Hold] gabapentin  300 mg Oral TID   heparin sodium (porcine)       [MAR Hold] lamoTRIgine  200 mg Oral Daily   midazolam       [MAR Hold] multivitamin with minerals  1 tablet Oral Daily   [MAR Hold] pantoprazole  40 mg Oral Daily   [MAR Hold] polyethylene glycol  17 g Oral Daily   [MAR Hold] pravastatin  40 mg Oral QHS   [MAR Hold] senna-docusate  2 tablet Oral BID   [MAR Hold] sodium chloride flush  3 mL Intravenous Q12H    Continuous Infusions:  [MAR Hold] sodium chloride     [MAR Hold]  ceFAZolin (ANCEF) IV  heparin 1,000 Units/hr (11/07/21 1929)     LOS: 2 days     Kayleen Memos, MD [facility] Pager [phone]  If 7PM-7AM, please contact night-coverage [email or web address] Password [facility] 11/08/2021, 3:55 PM

## 2021-11-08 NOTE — Consult Note (Signed)
@LOGO @   MRN : [id number]  Cheryl Schroeder is a 66 y.o. (04-11-1956) female who presents with chief complaint of chest pain.  History of Present Illness:   I am asked to see the patient by Dr. Raul Del.  Patient is a 66 year old woman who was admitted to [facility] 2 days ago with a complaint of chest pain as well as feeling dizzy to the point that she was unable to maintain her balance.  In retrospect she has been having similar symptoms for approximately 3 weeks.  At that time she presented with left leg swelling and pain and was found to have a DVT.  She was started on Eliquis.  She was also noted to have some tenderness of her rib cage bilaterally as well as pleuritic chest pain with deep breathing and coughing.  She states she has been taking her Eliquis as directed.  2 days ago she presented to the emergency room where work-up included a CT of the chest with contrast this demonstrated a large mass obstructing the right upper lobe and partially obstructing the right middle lobe.  Moderate to large size pulmonary emboli were noted in the left upper and lower lobe compromising the entire left lung.  Current Meds  Medication Sig   ALPRAZolam (XANAX) 0.5 MG tablet Take 0.5-1 mg by mouth at bedtime as needed.   ELIQUIS 5 MG TABS tablet Take 5 mg by mouth 2 (two) times daily.   FLUoxetine (PROZAC) 40 MG capsule Take 40 mg by mouth daily.   gabapentin (NEURONTIN) 300 MG capsule Take 300 mg by mouth 3 (three) times daily. Three pills at night as needed   HYDROcodone-acetaminophen (NORCO/VICODIN) 5-325 MG per tablet Take 1 tablet by mouth as needed for moderate pain.   lamoTRIgine (LAMICTAL) 200 MG tablet Take 200 mg by mouth daily.   omeprazole (PRILOSEC) 40 MG capsule Take 40 mg by mouth daily.   pravastatin (PRAVACHOL) 40 MG tablet Take 40 mg by mouth daily.    Past Medical History:  Diagnosis Date   Bipolar 2 disorder ([place])    Dysplastic nevus    R calf, txted in  past, pt states ~2002   Fibromyalgia    Psoriasis     Past Surgical History:  Procedure Laterality Date   ABDOMINAL HYSTERECTOMY     ADENOIDECTOMY     BREAST BIOPSY Right 1990   cyst   TONSILLECTOMY      Social History Social History   Tobacco Use   Smoking status: Never   Smokeless tobacco: Never  Vaping Use   Vaping Use: Never used  Substance Use Topics   Alcohol use: No   Drug use: No    Family History Family History  Problem Relation Age of Onset   Breast cancer Maternal Aunt 72   Lung cancer Father     Allergies  Allergen Reactions   Sulfa Antibiotics Nausea Only and Nausea And Vomiting     REVIEW OF SYSTEMS (Negative unless checked)  Constitutional: [] Weight loss  [] Fever  [] Chills Cardiac: [] Chest pain   [] Chest pressure   [] Palpitations   [] Shortness of breath when laying flat   [x] Shortness of breath with exertion. Vascular:  [] Pain in legs with walking   [] Pain in legs at rest  [x] History of DVT   [] Phlebitis   [x] Swelling in legs   [] Varicose veins   [] Non-healing ulcers Pulmonary:   [] Uses home oxygen   [] Productive cough   [] Hemoptysis   [] Wheeze  [] COPD   []   Asthma Neurologic:  [] Dizziness   [] Seizures   [] History of stroke   [] History of TIA  [] Aphasia   [] Vissual changes   [] Weakness or numbness in arm   [] Weakness or numbness in leg Musculoskeletal:   [] Joint swelling   [] Joint pain   [] Low back pain Hematologic:  [] Easy bruising  [] Easy bleeding   [] Hypercoagulable state   [] Anemic Gastrointestinal:  [] Diarrhea   [] Vomiting  [] Gastroesophageal reflux/heartburn   [] Difficulty swallowing. Genitourinary:  [] Chronic kidney disease   [] Difficult urination  [] Frequent urination   [] Blood in urine Skin:  [] Rashes   [] Ulcers  Psychological:  [] History of anxiety   []  History of major depression.  Physical Examination  Vitals:   11/07/21 1948 11/08/21 0600 11/08/21 0737 11/08/21 0814  BP: 123/71  119/79 126/76  Pulse: 81 75 79 78  Resp: 18 20 18 19    Temp: 98.5 F (36.9 C)  98.5 F (36.9 C) 98.4 F (36.9 C)  TempSrc: Oral   Oral  SpO2: 95%  97% 92%  Weight:      Height:       Body mass index is 20.59 kg/m. Gen: WD/WN, NAD Head: [place]/AT, No temporalis wasting.  Ear/Nose/Throat: Hearing grossly intact, nares w/o erythema or drainage, pinna without lesions Eyes: PER, EOMI, sclera nonicteric.  Neck: Supple, no gross masses.  No JVD.  Pulmonary:  Good air movement, no audible wheezing, no use of accessory muscles.  Cardiac: RRR, precordium not hyperdynamic. Vascular:    2+ soft pitting edema left greater than right Vessel Right Left  Radial Palpable Palpable  Gastrointestinal: soft, non-distended. No guarding/no peritoneal signs.  Musculoskeletal: M/S 5/5 throughout.  No deformity.  Neurologic: CN 2-12 intact. Pain and light touch intact in extremities.  Symmetrical.  Speech is fluent. Motor exam as listed above. Psychiatric: Judgment intact, Mood & affect appropriate for pt's clinical situation. Dermatologic: Venous rashes no ulcers noted.  No changes consistent with cellulitis. Lymph : No lichenification or skin changes of chronic lymphedema.  CBC Lab Results  Component Value Date   WBC 8.2 11/08/2021   HGB 10.6 (L) 11/08/2021   HCT 33.0 (L) 11/08/2021   MCV 93.2 11/08/2021   PLT 163 11/08/2021    BMET    Component Value Date/Time   NA 138 11/08/2021 0556   K 4.2 11/08/2021 0556   CL 104 11/08/2021 0556   CO2 27 11/08/2021 0556   GLUCOSE 92 11/08/2021 0556   BUN 12 11/08/2021 0556   CREATININE 0.89 11/08/2021 0556   CALCIUM 9.4 11/08/2021 0556   GFRNONAA >60 11/08/2021 0556   Estimated Creatinine Clearance: 56 mL/min (by C-G formula based on SCr of 0.89 mg/dL).  COAG Lab Results  Component Value Date   INR 1.3 (H) 11/06/2021    Radiology DG Chest 2 View  Result Date: 10/22/2021 CLINICAL DATA:  Contusion of the rib on the left side. Left rib pain. EXAM: CHEST - 2 VIEW COMPARISON:  None. FINDINGS: The  heart size and mediastinal contours are within normal limits. Soft tissue density mass with area of atelectasis in the right mid lung measuring approximately 3.0 x 4.9 cm. Bibasilar atelectasis. Hyperinflated lungs. No pleural effusion or pneumothorax. The visualized skeletal structures are unremarkable. IMPRESSION: 1. Soft tissue density mass in the right mid lung measuring at least 3.0 x 4.9 cm, it may represent pneumonia, loculated pleural effusion or pulmonary mass. Further evaluation with CT examination would be helpful. Follow-up to resolution is recommended. 2. Hyperinflated lungs concerning for COPD. Bibasilar atelectasis. No  pleural effusion or pneumothorax. An attempt was made to reach out to the provider Dr. Clemmie Krill without response from our office at the time of dictation. Electronically Signed   By: Keane Police D.O.   On: 10/22/2021 16:07   DG Ribs Unilateral W/Chest Left  Result Date: 11/06/2021 CLINICAL DATA:  Fall, left rib pain EXAM: LEFT RIBS AND CHEST - 3+ VIEW COMPARISON:  Chest x-ray 10/22/2021 FINDINGS: No acute fracture identified in the left ribs. Cardiomediastinal silhouette is unchanged. Persistent ovoid masslike opacity adjacent to the fissure in the right lung measuring approximately 5 x 3.2 cm, grossly unchanged. Linear opacities in the left lower lung zone likely represent subsegmental atelectasis. Hyperinflated lungs with mildly prominent interstitial lung markings. No pleural effusion or pneumothorax visualized. IMPRESSION: 1. No left rib fractures identified. 2. Persistent and unchanged ovoid perifissural masslike opacity in the right lung. 3. COPD. Electronically Signed   By: Ofilia Neas M.D.   On: 11/06/2021 12:16   CT Angio Chest PE W and/or Wo Contrast  Result Date: 11/06/2021 CLINICAL DATA:  Chest pain, recent trauma EXAM: CT ANGIOGRAPHY CHEST WITH CONTRAST TECHNIQUE: Multidetector CT imaging of the chest was performed using the standard protocol during bolus  administration of intravenous contrast. Multiplanar CT image reconstructions and MIPs were obtained to evaluate the vascular anatomy. RADIATION DOSE REDUCTION: This exam was performed according to the departmental dose-optimization program which includes automated exposure control, adjustment of the mA and/or kV according to patient size and/or use of iterative reconstruction technique. CONTRAST:  130mL OMNIPAQUE IOHEXOL 350 MG/ML SOLN COMPARISON:  Chest radiographs done earlier today FINDINGS: Cardiovascular: There is homogeneous enhancement in the thoracic aorta. There is mild ectasia of main pulmonary artery measuring 3.2 cm. There is saddle embolus in the left main pulmonary artery extending into the left upper lobe and left lower lobe branches. There are few small filling defects in the subsegmental branches in the right middle lobe and right lower lobe. There are no imaging signs of right ventricular strain. Mediastinum/Nodes: No significant lymphadenopathy seen. There is 2.5 cm low-density lesion in the enlarged left lobe of thyroid. Lungs/Pleura: There is moderate right pleural effusion. There are small patchy infiltrates in the medial aspect of right upper lobe in the right upper lung fields. There is 5.2 x 4.1 cm homogeneous density inseparable from interlobar fissure in the anterior right parahilar region. There is narrowing of the lumen of the bronchus leading to this homogeneous opacity in the anteromedial right mid lung fields. Linear patchy infiltrates are seen in both lower lung fields, more so on the right side. In image 13 of series 6, there is 2 cm ground-glass density in the anterior left apex. There is moderate right pleural effusion. There is no significant left pleural effusion. There is no pneumothorax. Upper Abdomen: Fatty liver. Musculoskeletal: There are numerous lytic lesions of varying sizes in the left ribs, thoracic spine and lumbar spine. There is decrease in height of bodies of C6  vertebra. There is also decrease in height of bodies of T6, T7, T8 and T9 vertebrae. Review of the MIP images confirms the above findings. IMPRESSION: There is evidence of acute pulmonary embolism with small to moderate thrombus burden. There are no imaging signs of acute right ventricular strain. There is no evidence of thoracic aortic dissection. There is 4.8 cm homogeneous opacity in the anterior right mid lung fields. This lesion has to be considered primary malignant neoplasm until proven otherwise. There is narrowing of bronchus leading to this parahilar  density. PET-CT and biopsy as warranted should be considered. There are numerous lytic lesions of varying sizes along with compression fractures in the cervical and thoracic spine as described in the body of the report consistent with extensive skeletal metastatic disease. Moderate right pleural effusion. There are ground-glass and patchy alveolar infiltrates in both lungs as described in the body of the report suggesting multifocal pneumonia. There is 2.5 cm low-density nodule in the left lobe of thyroid. When the patient's clinical condition permits, thyroid sonogram may be considered. Electronically Signed   By: Elmer Picker M.D.   On: 11/06/2021 15:21   CT ABDOMEN PELVIS W CONTRAST  Result Date: 11/06/2021 CLINICAL DATA:  Abdominal pain, acute, nonlocalized. Status post fall EXAM: CT ABDOMEN AND PELVIS WITH CONTRAST TECHNIQUE: Multidetector CT imaging of the abdomen and pelvis was performed using the standard protocol following bolus administration of intravenous contrast. RADIATION DOSE REDUCTION: This exam was performed according to the departmental dose-optimization program which includes automated exposure control, adjustment of the mA and/or kV according to patient size and/or use of iterative reconstruction technique. CONTRAST:  140mL OMNIPAQUE IOHEXOL 350 MG/ML SOLN COMPARISON:  None. FINDINGS: Lower chest: Right pleural effusion. Please  see separately dictated CT angiography chest 11/06/2021. Ports and Devices: None. Liver: Not enlarged. Several vague hypodense right hepatic lobe lesions measuring 0.9, 1.4, 0.7, 0.5 cm. No laceration or subcapsular hematoma. Biliary System: The gallbladder is otherwise unremarkable with no radio-opaque gallstones. No biliary ductal dilatation. Pancreas: Normal pancreatic contour. No main pancreatic duct dilatation. Spleen: Not enlarged. No focal lesion. No laceration, subcapsular hematoma, or vascular injury. Adrenal Glands: No nodularity bilaterally. Kidneys: Bilateral kidneys enhance symmetrically. No hydronephrosis. Subcentimeter hypodensities are too small characterize. A 1.1 cm vague wedge-shaped hypodensity along the left superior renal pole that likely represents a poorly defined lesion with a density of 45 Hounsfield units. No contusion, laceration, or subcapsular hematoma. No injury to the vascular structures or collecting systems. No hydroureter. The urinary bladder is unremarkable. Bowel: No small or large bowel wall thickening or dilatation. Non-visualization of the appendix. The appendix is not definitely identified with no inflammatory changes in the right lower quadrant to suggest acute appendicitis. Mesentery, Omentum, and Peritoneum: No simple free fluid ascites. No pneumoperitoneum. No hemoperitoneum. No mesenteric hematoma identified. No organized fluid collection. Pelvic Organs: Normal. Lymph Nodes: No abdominal, pelvic, inguinal lymphadenopathy. Vasculature: No abdominal aorta or iliac aneurysm. No active contrast extravasation or pseudoaneurysm. Musculoskeletal: No significant soft tissue hematoma. No acute pelvic fracture. No spinal fracture. Diffuse appendicular and axial skeleton lytic lesions. IMPRESSION: 1.  No acute traumatic injury to the abdomen, or pelvis. 2. No acute fracture or traumatic malalignment of the lumbar spine. 3. Several vague indeterminate hypodense right hepatic lobe  lesions measuring 0.9, 1.4, 0.7, 0.5 cm. Findings could represent metastases. 4. 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 5. An indeterminate 1.1 cm vague slightly wedge-shaped hypodensity along the left superior renal pole. Finding could represent an underlying poorly defined lesion, small infarction, less likely focal pyelonephritis. 6. Please see separately dictated CT angiography chest 11/06/2021. Electronically Signed   By: Iven Finn M.D.   On: 11/06/2021 15:15   DG Chest Port 1 View  Result Date: 11/07/2021 CLINICAL DATA:  Status post thorenthesis EXAM: PORTABLE CHEST 1 VIEW COMPARISON:  Chest radiograph 10/22/2021. Chest CT November 06, 2021. FINDINGS: Similar versus mildly increased right midlung masslike opacity. There is some fluid tracking along the right minor fissure. No visible pneumothorax. No consolidation. Similar cardiomediastinal silhouette. IMPRESSION:  1. Similar versus mildly increased right midlung masslike opacity, further characterized on recent chest CT. There is some fluid tracking along the right minor fissure. 2. No visible pneumothorax status post thoracentesis. Electronically Signed   By: Margaretha Sheffield M.D.   On: 11/07/2021 14:43   ECHOCARDIOGRAM COMPLETE  Result Date: 11/07/2021    ECHOCARDIOGRAM REPORT   Patient Name:   Cheryl Schroeder Date of Exam: 11/07/2021 Medical Rec #:  [id number]      Height:       65.5 in Accession #:    [id number]     Weight:       125.7 lb Date of Birth:  1955-10-17      BSA:          1.633 m Patient Age:    60 years       BP:           124/80 mmHg Patient Gender: F              HR:           78 bpm. Exam Location:  [facility] Procedure: 2D Echo, Cardiac Doppler and Color Doppler Indications:     Pulmonary Embolism I26.09  History:         Patient has no prior history of Echocardiogram examinations.                  Pulmonary emboli, acute pulmonary embolism.  Sonographer:     Sherrie Sport Referring Phys:  [id number] Lequita Halt Diagnosing  Phys: Donnelly Angelica IMPRESSIONS  1. Left ventricular ejection fraction, by estimation, is 60 to 65%. The left ventricle has normal function. The left ventricle has no regional wall motion abnormalities. Left ventricular diastolic parameters are consistent with Grade I diastolic dysfunction (impaired relaxation).  2. Right ventricular systolic function is normal. The right ventricular size is mildly enlarged. There is mildly elevated pulmonary artery systolic pressure. The estimated right ventricular systolic pressure is 82.4 mmHg.  3. Focal thickening of MV leaflet. Correlate clinically. . The mitral valve is degenerative. Mild mitral valve regurgitation. No evidence of mitral stenosis.  4. The aortic valve is normal in structure. Aortic valve regurgitation is not visualized. No aortic stenosis is present.  5. The inferior vena cava is normal in size with <50% respiratory variability, suggesting right atrial pressure of 8 mmHg. FINDINGS  Left Ventricle: Left ventricular ejection fraction, by estimation, is 60 to 65%. The left ventricle has normal function. The left ventricle has no regional wall motion abnormalities. The left ventricular internal cavity size was normal in size. There is  no left ventricular hypertrophy. Left ventricular diastolic parameters are consistent with Grade I diastolic dysfunction (impaired relaxation). Right Ventricle: The right ventricular size is mildly enlarged. No increase in right ventricular wall thickness. Right ventricular systolic function is normal. There is mildly elevated pulmonary artery systolic pressure. The tricuspid regurgitant velocity is 2.81 m/s, and with an assumed right atrial pressure of 8 mmHg, the estimated right ventricular systolic pressure is 23.5 mmHg. Left Atrium: Left atrial size was normal in size. Right Atrium: Right atrial size was normal in size. Pericardium: There is no evidence of pericardial effusion. Mitral Valve: Focal thickening of MV leaflet.  Correlate clinically. The mitral valve is degenerative in appearance. Mild mitral valve regurgitation. No evidence of mitral valve stenosis. MV peak gradient, 6.7 mmHg. The mean mitral valve gradient is 3.0 mmHg. Tricuspid Valve: The tricuspid valve is normal in structure. Tricuspid valve regurgitation is mild .  No evidence of tricuspid stenosis. Aortic Valve: The aortic valve is normal in structure. Aortic valve regurgitation is not visualized. No aortic stenosis is present. Aortic valve mean gradient measures 3.0 mmHg. Aortic valve peak gradient measures 4.8 mmHg. Aortic valve area, by VTI measures 2.53 cm. Pulmonic Valve: The pulmonic valve was normal in structure. Pulmonic valve regurgitation is not visualized. No evidence of pulmonic stenosis. Aorta: The aortic root is normal in size and structure. Venous: The inferior vena cava is normal in size with less than 50% respiratory variability, suggesting right atrial pressure of 8 mmHg. IAS/Shunts: No atrial level shunt detected by color flow Doppler.  LEFT VENTRICLE PLAX 2D LVIDd:         3.70 cm   Diastology LVIDs:         2.30 cm   LV e' medial:    5.11 cm/s LV PW:         1.40 cm   LV E/e' medial:  15.9 LV IVS:        1.10 cm   LV e' lateral:   4.35 cm/s LVOT diam:     2.00 cm   LV E/e' lateral: 18.7 LV SV:         52 LV SV Index:   32 LVOT Area:     3.14 cm  RIGHT VENTRICLE RV Basal diam:  2.90 cm RV S prime:     13.10 cm/s TAPSE (M-mode): 2.1 cm LEFT ATRIUM             Index        RIGHT ATRIUM           Index LA diam:        2.50 cm 1.53 cm/m   RA Area:     14.40 cm LA Vol (A2C):   55.7 ml 34.11 ml/m  RA Volume:   34.30 ml  21.01 ml/m LA Vol (A4C):   50.6 ml 30.99 ml/m LA Biplane Vol: 53.7 ml 32.89 ml/m  AORTIC VALVE                    PULMONIC VALVE AV Area (Vmax):    2.69 cm     PV Vmax:        0.70 m/s AV Area (Vmean):   2.50 cm     PV Vmean:       47.133 cm/s AV Area (VTI):     2.53 cm     PV VTI:         0.128 m AV Vmax:           109.00 cm/s   PV Peak grad:   1.9 mmHg AV Vmean:          74.700 cm/s  PV Mean grad:   1.0 mmHg AV VTI:            0.206 m      RVOT Peak grad: 3 mmHg AV Peak Grad:      4.8 mmHg AV Mean Grad:      3.0 mmHg LVOT Vmax:         93.30 cm/s LVOT Vmean:        59.400 cm/s LVOT VTI:          0.166 m LVOT/AV VTI ratio: 0.81  AORTA Ao Root diam: 3.17 cm MITRAL VALVE                TRICUSPID VALVE MV Area (PHT): 3.17 cm     TR  Peak grad:   31.6 mmHg MV Area VTI:   1.83 cm     TR Vmax:        281.00 cm/s MV Peak grad:  6.7 mmHg MV Mean grad:  3.0 mmHg     SHUNTS MV Vmax:       1.29 m/s     Systemic VTI:  0.17 m MV Vmean:      75.9 cm/s    Systemic Diam: 2.00 cm MV Decel Time: 239 msec     Pulmonic VTI:  0.158 m MV E velocity: 81.40 cm/s MV A velocity: 109.00 cm/s MV E/A ratio:  0.75 Donnelly Angelica Electronically signed by Donnelly Angelica Signature Date/Time: 11/07/2021/1:06:16 PM    Final    US THORACENTESIS ASP PLEURAL SPACE W/IMG GUIDE  Result Date: 11/07/2021 INDICATION: 66 year old with right lung mass and concern for metastatic disease. EXAM: ULTRASOUND GUIDED RIGHT THORACENTESIS MEDICATIONS: None. COMPLICATIONS: None immediate. PROCEDURE: An ultrasound guided thoracentesis was thoroughly discussed with the patient and questions answered. The benefits, risks, alternatives and complications were also discussed. The patient understands and wishes to proceed with the procedure. Written consent was obtained. Ultrasound was performed to localize and mark an adequate pocket of fluid in the right chest. The area was then prepped and draped in the normal sterile fashion. 1% Lidocaine was used for local anesthesia. Under ultrasound guidance a 6 Fr Safe-T-Centesis catheter was introduced. Thoracentesis was performed. The catheter was removed and a dressing applied. FINDINGS: A total of approximately 350 mL of amber colored fluid was removed. Samples were sent to the laboratory as requested by the clinical team. IMPRESSION: Successful ultrasound  guided right thoracentesis yielding 350 mL of pleural fluid. Electronically Signed   By: Markus Daft M.D.   On: 11/07/2021 15:01     Assessment/Plan  Pulmonary emboli left lung with severely compromised pulmonary status. I recommend she undergo left pulmonary thrombectomy given the severe compromise of her right lung.  This malignancy is recently diagnosed and her treatment plan is still evolving.  Therefore, any immediate improvement we can effect in the short-term would be tremendously helpful for her quality life and daily activities.  Risk and benefits of been reviewed all questions been answered patient has agreed to proceed  2.  Failure of anticoagulation: Given she failed Eliquis I concur with Dr. Raul Del an IVC filter is most appropriate.  Risks and benefits of been reviewed all questions were answered patient has agreed and we will plan for IVC filter placement after thrombectomy  3.  DVT left leg: She will continue on anticoagulation I discussed with Dr. Raul Del we can change her from Eliquis to Xarelto and given the 21 days of the higher dose this should provide appropriate anticoagulation  4.  Right lung malignancy: This is clearly causing compromise of her right lung and is reducing her overall pulmonary capacity.  In conjunction with her left pulmonary emboli she is now symptomatic with shortness of breath and syncopal episodes.  The pulmonary service is all ready involved in his evaluating this but given this finding thrombectomy of her left lung is indicated.   Hortencia Pilar, MD  11/08/2021 11:34 AM

## 2021-11-09 ENCOUNTER — Inpatient Hospital Stay: Payer: Medicare HMO

## 2021-11-09 DIAGNOSIS — I82409 Acute embolism and thrombosis of unspecified deep veins of unspecified lower extremity: Secondary | ICD-10-CM

## 2021-11-09 DIAGNOSIS — N289 Disorder of kidney and ureter, unspecified: Secondary | ICD-10-CM

## 2021-11-09 DIAGNOSIS — D6869 Other thrombophilia: Secondary | ICD-10-CM

## 2021-11-09 DIAGNOSIS — Z681 Body mass index (BMI) 19 or less, adult: Secondary | ICD-10-CM

## 2021-11-09 DIAGNOSIS — G9389 Other specified disorders of brain: Secondary | ICD-10-CM

## 2021-11-09 DIAGNOSIS — I639 Cerebral infarction, unspecified: Secondary | ICD-10-CM

## 2021-11-09 DIAGNOSIS — E041 Nontoxic single thyroid nodule: Secondary | ICD-10-CM

## 2021-11-09 DIAGNOSIS — I248 Other forms of acute ischemic heart disease: Secondary | ICD-10-CM

## 2021-11-09 DIAGNOSIS — I6310 Cerebral infarction due to embolism of unspecified precerebral artery: Secondary | ICD-10-CM

## 2021-11-09 HISTORY — DX: Nontoxic single thyroid nodule: E04.1

## 2021-11-09 HISTORY — DX: Other thrombophilia: D68.69

## 2021-11-09 HISTORY — DX: Body mass index (BMI) 19.9 or less, adult: Z68.1

## 2021-11-09 HISTORY — DX: Cerebral infarction, unspecified: I63.9

## 2021-11-09 HISTORY — DX: Acute embolism and thrombosis of unspecified deep veins of unspecified lower extremity: I82.409

## 2021-11-09 LAB — CBC
HCT: 32.1 % — ABNORMAL LOW (ref 36.0–46.0)
Hemoglobin: 10.1 g/dL — ABNORMAL LOW (ref 12.0–15.0)
MCH: 29.6 pg (ref 26.0–34.0)
MCHC: 31.5 g/dL (ref 30.0–36.0)
MCV: 94.1 fL (ref 80.0–100.0)
Platelets: 229 10*3/uL (ref 150–400)
RBC: 3.41 MIL/uL — ABNORMAL LOW (ref 3.87–5.11)
RDW: 12.5 % (ref 11.5–15.5)
WBC: 9.4 10*3/uL (ref 4.0–10.5)
nRBC: 0 % (ref 0.0–0.2)

## 2021-11-09 LAB — BASIC METABOLIC PANEL
Anion gap: 6 (ref 5–15)
BUN: 17 mg/dL (ref 8–23)
CO2: 28 mmol/L (ref 22–32)
Calcium: 9 mg/dL (ref 8.9–10.3)
Chloride: 102 mmol/L (ref 98–111)
Creatinine, Ser: 0.95 mg/dL (ref 0.44–1.00)
GFR, Estimated: >60 mL/min (ref >60)
Glucose, Bld: 112 mg/dL — ABNORMAL HIGH (ref 70–99)
Potassium: 5 mmol/L (ref 3.5–5.1)
Sodium: 136 mmol/L (ref 135–145)

## 2021-11-09 LAB — HEPARIN LEVEL (UNFRACTIONATED): Heparin Unfractionated: 0.49 IU/mL (ref 0.30–0.70)

## 2021-11-09 MED ORDER — STROKE: EARLY STAGES OF RECOVERY BOOK: Freq: Once | Status: AC

## 2021-11-09 MED ORDER — GADOBUTROL 1 MMOL/ML IV SOLN
6.0000 mL | Freq: Once | INTRAVENOUS | Status: AC | PRN
  Administered 2021-11-09: 6 mL via INTRAVENOUS

## 2021-11-09 MED ORDER — IOHEXOL 350 MG/ML SOLN
75.0000 mL | Freq: Once | INTRAVENOUS | Status: AC | PRN
  Administered 2021-11-09: 20:00:00 75 mL via INTRAVENOUS

## 2021-11-09 NOTE — Assessment & Plan Note (Signed)
--   Worrisome for malignancy.  Continue work-up as underlying mass.

## 2021-11-09 NOTE — Assessment & Plan Note (Signed)
--   Left lobe of the thyroid, consider outpatient sonogram

## 2021-11-09 NOTE — Assessment & Plan Note (Signed)
--   Secondary to presumed malignancy.  Lifelong anticoagulation.

## 2021-11-09 NOTE — Assessment & Plan Note (Signed)
--   Worrisome for metastatic disease

## 2021-11-09 NOTE — Assessment & Plan Note (Addendum)
--   Asymptomatic, no hypoxia, hemodynamic stable.  Although troponins were elevated consistent with demand ischemia, 2D echocardiogram reassuring.  Status post catheter directed thrombectomy --Continue heparin, can change to oral anticoagulation when cleared by surgery.  Pulmonology and oncology recommended discontinuation of apixaban and initiation of Xarelto on discharge given PE while on Eliquis. 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

## 2021-11-09 NOTE — Assessment & Plan Note (Signed)
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

## 2021-11-09 NOTE — Assessment & Plan Note (Signed)
--   Worrisome for metastatic disease, work-up in progress

## 2021-11-09 NOTE — Consult Note (Addendum)
NEUROLOGY CONSULTATION NOTE   Date of service: November 09, 2021 Patient Name: Cheryl Schroeder MRN:  [id number] DOB:  1956-06-01 Reason for consult: Dr. Sarajane Jews Requesting physician: stroke _ _ _   _ __   _ __ _ _  __ __   _ __   __ _  History of Present Illness   This is a 67 yo woman with recent outpatient dx DVT treated with eliquis who presented with chest pain and frequent falls. CTA chest showed acute saddle embolism and multiple PE with R middle lung mass and skeletal lesions c/f malignancy. She underwent L pulm mechnical thrombectomy and placement of IVC filter. Neurology is consulted for MRI brain findings of multiple small acute and subacute infarcts as well as 2 small enhancing lesions c/f brain metastases (personal review). Patient states that she has been having frequent falls with dizziness over the past few weeks which got worse over the past week. She denies any unilateral weakness or numbness but states she sometimes has trouble getting her brain to tell her body what to do.   TTE  1. Left ventricular ejection fraction, by estimation, is 60 to 65%. The  left ventricle has normal function. The left ventricle has no regional  wall motion abnormalities. Left ventricular diastolic parameters are  consistent with Grade I diastolic  dysfunction (impaired relaxation).   2. Right ventricular systolic function is normal. The right ventricular  size is mildly enlarged. There is mildly elevated pulmonary artery  systolic pressure. The estimated right ventricular systolic pressure is  02.6 mmHg.   3. Focal thickening of MV leaflet. Correlate clinically. . The mitral  valve is degenerative. Mild mitral valve regurgitation. No evidence of  mitral stenosis.   4. The aortic valve is normal in structure. Aortic valve regurgitation is  not visualized. No aortic stenosis is present.   5. The inferior vena cava is normal in size with <50% respiratory  variability, suggesting right atrial  pressure of 8 mmHg.   LDL, A1c pending   ROS   Per HPI: all other systems reviewed and are negative  Past History   I have reviewed the following:  Past Medical History:  Diagnosis Date   Bipolar 2 disorder (HCC)    Dysplastic nevus    R calf, txted in past, pt states ~2002   Fibromyalgia    Psoriasis    Past Surgical History:  Procedure Laterality Date   ABDOMINAL HYSTERECTOMY     ADENOIDECTOMY     BREAST BIOPSY Right 1990   cyst   TONSILLECTOMY     Family History  Problem Relation Age of Onset   Breast cancer Maternal Aunt 16   Lung cancer Father    Social History   Socioeconomic History   Marital status: Single    Spouse name: Not on file   Number of children: Not on file   Years of education: Not on file   Highest education level: Not on file  Occupational History   Not on file  Tobacco Use   Smoking status: Never   Smokeless tobacco: Never  Vaping Use   Vaping Use: Never used  Substance and Sexual Activity   Alcohol use: No   Drug use: No   Sexual activity: Not on file  Other Topics Concern   Not on file  Social History Narrative   Not on file   Social Determinants of Health   Financial Resource Strain: Not on file  Food Insecurity: Not on file  Transportation Needs: Not on file  Physical Activity: Not on file  Stress: Not on file  Social Connections: Not on file   Allergies  Allergen Reactions   Sulfa Antibiotics Nausea Only and Nausea And Vomiting    Medications   Medications Prior to Admission  Medication Sig Dispense Refill Last Dose   ALPRAZolam (XANAX) 0.5 MG tablet Take 0.5-1 mg by mouth at bedtime as needed.   11/05/2021 at 2000   ELIQUIS 5 MG TABS tablet Take 5 mg by mouth 2 (two) times daily.   11/05/2021 at 2000   FLUoxetine (PROZAC) 40 MG capsule Take 40 mg by mouth daily.   11/05/2021 at 0800   gabapentin (NEURONTIN) 300 MG capsule Take 300 mg by mouth 3 (three) times daily. Three pills at night as needed   11/05/2021 at 2000    HYDROcodone-acetaminophen (NORCO/VICODIN) 5-325 MG per tablet Take 1 tablet by mouth as needed for moderate pain.   11/05/2021   lamoTRIgine (LAMICTAL) 200 MG tablet Take 200 mg by mouth daily.   11/05/2021 at 0800   omeprazole (PRILOSEC) 40 MG capsule Take 40 mg by mouth daily.   11/05/2021 at 0800   pravastatin (PRAVACHOL) 40 MG tablet Take 40 mg by mouth daily.   11/05/2021 at 2000   acetaminophen (TYLENOL) 500 MG tablet Take 500-1,000 mg by mouth as needed.   prn at unknown   amoxicillin-clavulanate (AUGMENTIN) 875-125 MG tablet SMARTSIG:1 Tablet(s) By Mouth Every 12 Hours (Patient not taking: Reported on 11/06/2021)   Not Taking   Calcium Carbonate-Vitamin D (CALCIUM + D PO) Take by mouth daily. (Patient not taking: Reported on 11/06/2021)   Not Taking   Fluocinolone Acetonide Body 0.01 % OIL Apply 1 application topically daily. Daily for 2 weeks then decrease to 5 times per week as needed. 120 mL 5 prn at unknown   fluticasone (FLONASE) 50 MCG/ACT nasal spray Place 1 spray into both nostrils daily.   prn at unknown      Current Facility-Administered Medications:    0.9 %  sodium chloride infusion, 250 mL, Intravenous, PRN, Schnier, Dolores Lory, MD   acetaminophen (TYLENOL) tablet 650 mg, 650 mg, Oral, Q4H PRN, Schnier, Dolores Lory, MD, 650 mg at 11/06/21 2019   ALPRAZolam Duanne Moron) tablet 0.5-1 mg, 0.5-1 mg, Oral, QHS PRN, Schnier, Dolores Lory, MD, 1 mg at 11/09/21 0147   feeding supplement (ENSURE ENLIVE / ENSURE PLUS) liquid 237 mL, 237 mL, Oral, BID BM, Schnier, Dolores Lory, MD, 237 mL at 11/09/21 1431   FLUoxetine (PROZAC) capsule 40 mg, 40 mg, Oral, Daily, Schnier, Dolores Lory, MD, 40 mg at 11/09/21 0933   fluticasone (FLONASE) 50 MCG/ACT nasal spray 1 spray, 1 spray, Each Nare, Daily, Schnier, Dolores Lory, MD   gabapentin (NEURONTIN) capsule 300 mg, 300 mg, Oral, TID, Schnier, Dolores Lory, MD, 300 mg at 11/09/21 1431   heparin ADULT infusion 100 units/mL (25000 units/228mL), 1,000 Units/hr, Intravenous,  Continuous, Schnier, Dolores Lory, MD, Last Rate: 10 mL/hr at 11/09/21 1939, 1,000 Units/hr at 11/09/21 1939   HYDROcodone-acetaminophen (NORCO/VICODIN) 5-325 MG per tablet 1 tablet, 1 tablet, Oral, Q4H PRN, Schnier, Dolores Lory, MD, 1 tablet at 11/09/21 3976   HYDROmorphone (DILAUDID) injection 0.5 mg, 0.5 mg, Intravenous, Q4H PRN, Schnier, Dolores Lory, MD, 0.5 mg at 11/08/21 2307   lamoTRIgine (LAMICTAL) tablet 200 mg, 200 mg, Oral, Daily, Schnier, Dolores Lory, MD, 200 mg at 11/09/21 7341   multivitamin with minerals tablet 1 tablet, 1 tablet, Oral, Daily, Schnier, Dolores Lory, MD, 1  tablet at 11/09/21 0926   ondansetron (ZOFRAN) injection 4 mg, 4 mg, Intravenous, Q6H PRN, Schnier, Dolores Lory, MD, 4 mg at 11/07/21 1535   oxyCODONE (Oxy IR/ROXICODONE) immediate release tablet 5 mg, 5 mg, Oral, Q4H PRN, Schnier, Dolores Lory, MD, 5 mg at 11/09/21 1125   pantoprazole (PROTONIX) EC tablet 40 mg, 40 mg, Oral, Daily, Schnier, Dolores Lory, MD, 40 mg at 11/09/21 0926   polyethylene glycol (MIRALAX / GLYCOLAX) packet 17 g, 17 g, Oral, Daily, Schnier, Dolores Lory, MD, 17 g at 11/09/21 7356   pravastatin (PRAVACHOL) tablet 40 mg, 40 mg, Oral, QHS, Schnier, Dolores Lory, MD, 40 mg at 11/08/21 2025   senna-docusate (Senokot-S) tablet 2 tablet, 2 tablet, Oral, BID, Schnier, Dolores Lory, MD, 2 tablet at 11/09/21 0926   sodium chloride flush (NS) 0.9 % injection 3 mL, 3 mL, Intravenous, Q12H, Schnier, Dolores Lory, MD, 3 mL at 11/08/21 2028   sodium chloride flush (NS) 0.9 % injection 3 mL, 3 mL, Intravenous, PRN, Katha Cabal, MD  Vitals   Vitals:   11/09/21 0345 11/09/21 0500 11/09/21 0827 11/09/21 1545  BP: 101/69  114/69 (!) 101/59  Pulse: 89  91 87  Resp: 18  18 18   Temp: 97.9 F (36.6 C)  98 F (36.7 C) 98.2 F (36.8 C)  TempSrc:   Oral Oral  SpO2: 91%  94% 96%  Weight:  54.6 kg    Height:         Body mass index is 19.71 kg/m.  Physical Exam   Physical Exam Gen: A&O x4, NAD HEENT: Atraumatic,  normocephalic;mucous membranes moist; oropharynx clear, tongue without atrophy or fasciculations. Neck: Supple, trachea midline. Resp: CTAB, no w/r/r CV: RRR, no m/g/r; nml S1 and S2. 2+ symmetric peripheral pulses. Abd: soft/NT/ND; nabs x 4 quad Extrem: Nml bulk; no cyanosis, clubbing, or edema.  Neuro: *MS: A&O x4. Follows single step commands but often gets confused during multistep. *Speech: fluid, nondysarthric, able to name and repeat *CN:    I: Deferred   II,III: PERRLA, VFF by confrontation, optic discs unable to be visualized 2/2 pupillary constriction   III,IV,VI: EOMI w/o nystagmus, no ptosis   V: Sensation intact from V1 to V3 to LT   VII: Eyelid closure was full.  Smile symmetric.   VIII: Hearing intact to voice   IX,X: Voice normal, palate elevates symmetrically    XI: SCM/trap 5/5 bilat   XII: Tongue protrudes midline, no atrophy or fasciculations   *Motor:   Normal bulk.  No tremor, rigidity or bradykinesia. No pronator drift.    Strength: Dlt Bic Tri WrE WrF FgS Gr HF KnF KnE PlF DoF    Left 5 5 5 5 5 5 5 5 5 5 5 5     Right 5 5 5 5 5 5 5 5 5 5 5 5     *Sensory: Intact to light touch, pinprick, temperature vibration throughout. Symmetric. Propioception intact bilat.  No double-simultaneous extinction.  *Coordination:  Finger-to-nose, heel-to-shin, rapid alternating motions were intact. *Reflexes:  2+ and symmetric throughout without clonus; toes down-going bilat *Gait: normal base, normal stride, normal turn. Negative Romberg.  NIHSS =0   Premorbid mRS = 1   Labs   CBC:  Recent Labs  Lab 11/06/21 1257 11/08/21 0556 11/09/21 0219  WBC 9.9 8.2 9.4  NEUTROABS 7.8*  --   --   HGB 12.1 10.6* 10.1*  HCT 38.0 33.0* 32.1*  MCV 93.4 93.2 94.1  PLT 139* 163 229  Basic Metabolic Panel:  Lab Results  Component Value Date   NA 136 11/09/2021   K 5.0 11/09/2021   CO2 28 11/09/2021   GLUCOSE 112 (H) 11/09/2021   BUN 17 11/09/2021   CREATININE 0.95  11/09/2021   CALCIUM 9.0 11/09/2021   GFRNONAA >60 11/09/2021   Lipid Panel: No results found for: LDLCALC HgbA1c: No results found for: HGBA1C Urine Drug Screen: No results found for: LABOPIA, COCAINSCRNUR, LABBENZ, AMPHETMU, THCU, LABBARB  Alcohol Level No results found for: ETH   Impression   This is a 66 yo woman with recent outpatient dx DVT treated with eliquis who p/w SOB and was found to have saddle embolus with multiple PE. She reports dizziness and has no neurologic sx on exam other than apraxia, both of which are likely 2/2 her acute and subacute infarcts. Her infarcts are bilateral and anterior/posterior consistent with a central embolic source. TTE showed no e/o intracardiac clot. There is no indication for TEE at this time since she will need to be on anticoagulation for PE regardless.   Recommendations   # Acute and subacute embolic infarcts 2/2 hypercoag state 2/2 underlying malignancy - Permissive HTN x48 hrs from sx onset  goal BP <220/110. PRN labetalol or hydralazine if BP above these parameters. Avoid oral antihypertensives. Outside of 48 hours, goal is normotension, avoid hypotension - CTA H&N - Check A1c and LDL + add statin per guidelines - No indication for antiplatelet in addition to therapeutic anticoagulation for secondary stroke prevention - Continue heparin gtt for tx of acute PE - q4 hr neuro checks - STAT head CT for any change in neuro exam - Tele - PT/OT/SLP - Stroke education  # Possible brain metastases - Regarding her enhancing brain lesions these are concerning for metastases, I will defer further workup of these to the oncologist and any other specialists they deem appropriate to consult  I will follow up on her CTA H&N, and otherwise be available prn for any questions from the primary or consulting oncology team going forward   ______________________________________________________________________   Thank you for the opportunity to take  part in the care of this patient. If you have any further questions, please contact the neurology consultation attending.  Signed,  Su Monks, MD [facility] [phone]  If 7pm- 7am, please page neurology on call as listed in [place].

## 2021-11-09 NOTE — Assessment & Plan Note (Signed)
--   Brain imaging concerning for subacute infarcts.  Asymptomatic with no focal symptoms reported or demonstrated on exam.  Stroke work-up.  Neurology consultation.

## 2021-11-09 NOTE — Hospital Course (Addendum)
66 year old woman PMH recent outpatient diagnosis DVT treated with apixaban, presented with chest pain and frequent falls.  CT revealed acute saddle embolism and multiple PE as well as right middle lung mass and skeletal lesions concerning for malignancy.  Seen by pulmonology with recommendation for malignancy work-up. S/p right-sided thoracentesis. Seen by vascular surgery and underwent left pulmonary mechanical thrombectomy as well as placement of IVC.  Brain MRI concerning for metastatic disease as well as subacute infarcts.  Neurology consulted.  Awaiting cytology from thoracentesis, if negative will need biopsy.  Given need for anticoagulation, obtain biopsy prior to transition to oral anticoagulation.  Once biopsy completed, anticipate discharge home. --2/5 Overall doing well, no PT needed on discharge. Once cytology/biopsy complete, can discharge home.

## 2021-11-09 NOTE — Consult Note (Signed)
ANTICOAGULATION CONSULT NOTE   Pharmacy Consult for Heparin dosing Indication: pulmonary embolus  Allergies  Allergen Reactions   Sulfa Antibiotics Nausea Only and Nausea And Vomiting    Patient Measurements: Height: 5' 5.51" (166.4 cm) Weight: 57 kg (125 lb 10.6 oz) IBW/kg (Calculated) : 58.18 Heparin Dosing Weight: 52.2kg  Vital Signs: Temp: 98 F (36.7 C) (02/03 2001) Temp Source: Oral (02/03 1822) BP: 120/76 (02/03 2001) Pulse Rate: 81 (02/03 2001)  Labs: Recent Labs    11/06/21 1257 11/06/21 1533 11/07/21 0412 11/07/21 0435 11/07/21 0955 11/08/21 0556 11/09/21 0219  HGB 12.1  --   --   --   --  10.6* 10.1*  HCT 38.0  --   --   --   --  33.0* 32.1*  PLT 139*  --   --   --   --  163 229  APTT 32   < > 67*  --  68* 67*  --   LABPROT 16.2*  --   --   --   --   --   --   INR 1.3*  --   --   --   --   --   --   HEPARINUNFRC  --    < > >1.10*  --   --  0.60 0.49  CREATININE 0.85  --  0.97  --   --  0.89 0.95  TROPONINIHS 396*  --   --  428*  --  188*  --    < > = values in this interval not displayed.     Estimated Creatinine Clearance: 52.4 mL/min (by C-G formula based on SCr of 0.95 mg/dL).   Medical History: Past Medical History:  Diagnosis Date   Bipolar 2 disorder ([place])    Dysplastic nevus    R calf, txted in past, pt states ~2002   Fibromyalgia    Psoriasis     Medications:  Apixaban 5mg  BID (for DVT lower extremity)  Assessment: patient is a 66 year old female recently diagnosed with a DVT about 3 weeks ago who is on Eliquis who presents with some left upper abdominal pain and back pain for several weeks.CT positive for pulmonary emboli.  2/3 0656 HL 0.60, aPTT 67  -- Therapuetic  Goal of Therapy:  Heparin level 0.3-0.7 units/ml aPTT 66-102 seconds Monitor platelets by anticoagulation protocol: Yes   Plan:  2/4: HL @ 7846 = 0.49, therapeutic X 2 Will continue pt on current rate and recheck HL on 2/5 with AM labs.   Orene Desanctis,  PharmD Clinical Pharmacist   11/09/2021,3:35 AM

## 2021-11-09 NOTE — Assessment & Plan Note (Signed)
--   Diagnosed prior to admission, continue anticoagulation

## 2021-11-09 NOTE — Assessment & Plan Note (Signed)
--   Asymptomatic.  Secondary to acute PE, echocardiogram reassuring, EKG nonacute no further evaluation suggested

## 2021-11-09 NOTE — Assessment & Plan Note (Signed)
--   Concerning for primary lung cancer, imaging concerning for metastatic disease to skeleton, liver, brain.  Follow-up cytology from pleural effusion.  If negative, proceed with biopsy per interventional radiology or pulmonology.  Outpatient PET scan.

## 2021-11-09 NOTE — Assessment & Plan Note (Signed)
--  left superior renal pole, no signs or symptoms to suggest infection.  Renal function stable.  Follow-up as an outpatient.

## 2021-11-09 NOTE — Progress Notes (Signed)
Progress Note   Patient: Cheryl Schroeder ZOX:[id number] DOB: 01/05/1956 DOA: 11/06/2021     3 DOS: the patient was seen and examined on 11/09/2021   Brief hospital course: 66 year old woman PMH recent outpatient diagnosis DVT treated with apixaban, presented with chest pain and frequent falls.  CT revealed acute saddle embolism and multiple PE as well as right middle lung mass and skeletal lesions concerning for malignancy.  Seen by pulmonology with recommendation for malignancy work-up and right-sided thoracentesis which was completed 2/2 by interventional radiology with removal of 330mL amber fluid.  Seen by vascular surgery and underwent left pulmonary mechanical thrombectomy secondary to severe compromise right lung as well as placement of IVC.  Brain MRI concerning for metastatic disease as well as possible subacute infarcts.  Neurology consulted.  Assessment and Plan: * Acute pulmonary embolism ([place]) -- Asymptomatic, no hypoxia, hemodynamic stable.  Although troponins were elevated consistent with demand ischemia, 2D echocardiogram reassuring.  Status post catheter directed thrombectomy --Continue heparin, can change to oral anticoagulation when cleared by surgery.  Pulmonology and oncology recommended discontinuation of apixaban and initiation of Xarelto on discharge given PE while on Eliquis. -- Presumed secondary to acquired thrombophilia from suspected malignancy  CVA (cerebral vascular accident) [facility]) -- Brain imaging concerning for subacute infarcts.  Asymptomatic with no focal symptoms reported or demonstrated on exam.  Stroke work-up.  Neurology consultation.  Brain mass -- Worrisome for malignancy.  Continue work-up as underlying mass.  Liver lesion -- Worrisome for metastatic disease  Bone lesion -- Worrisome for metastatic disease, work-up in progress  Mass of right lung -- Concerning for primary lung cancer, imaging concerning for metastatic disease to skeleton, liver, brain.   Follow-up cytology from pleural effusion.  If negative, proceed with biopsy per interventional radiology or pulmonology.  Outpatient PET scan.  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  Lifelong anticoagulation.  Demand ischemia ([place]) -- Asymptomatic.  Secondary to acute PE, echocardiogram reassuring, EKG nonacute no further evaluation suggested  Renal lesion --left superior renal pole, no signs or symptoms to suggest infection.  Renal function stable.  Follow-up as an outpatient.  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  Will work-up for possible stroke, consult neurology, await further recommendations from oncology.  Anticipate discharge home when work-up complete.  Subjective: Feels tired from MRI overnight but otherwise okay.  No weakness or numbness of arms or legs.  No difficulty speaking or swallowing.  Some pain in leg from angiogram.  Breathing fine.  Lives with her 57 year old mother for whom she has a primary caretaker.  Plans to return home on discharge.  Sister at bedside.  Physical Exam: Vitals:   11/08/21 2001 11/09/21 0345 11/09/21 0500 11/09/21 0827  BP: 120/76 101/69  114/69  Pulse: 81 89  91  Resp: 18 18  18   Temp: 98 F (36.7 C) 97.9 F (36.6 C)  98 F (36.7 C)  TempSrc:    Oral  SpO2: 94% 91%  94%  Weight:   54.6 kg   Height:       Physical Exam Vitals reviewed.  Constitutional:      General: She is not in acute distress.    Appearance: She is ill-appearing. She is not toxic-appearing.  Cardiovascular:     Rate and Rhythm: Normal rate and regular rhythm.  Heart sounds: No murmur heard.    Comments: Telemetry SR Pulmonary:     Effort: Pulmonary effort is normal.     Breath sounds: No wheezing, rhonchi  or rales.  Musculoskeletal:     Right lower leg: No edema.     Left lower leg: No edema.  Neurological:     General: No focal deficit present.     Mental Status: She is alert.     Sensory: No sensory deficit.     Motor: No weakness.     Comments: Face symmetric EOMI intact  Psychiatric:        Mood and Affect: Mood normal.        Behavior: Behavior normal.    Data Reviewed: Chart reviewed in detail including MRI brain concerning for metastatic disease and infarcts.  Neurology consulted.  Hemoglobin stable at 10.1, BMP is unremarkable.  Family Communication: Discussed in detail with sister at bedside  Disposition: Status is: Inpatient  Remains inpatient appropriate because: Work-up for newly discovered stroke.  Any further inpatient work-up from oncology perspective.   Planned Discharge Destination: Home     Time spent: 35 minutes  Author: Murray Hodgkins, MD 11/09/2021 9:08 AM  For on call review [email or web address].

## 2021-11-09 NOTE — Plan of Care (Signed)
°  Problem: Clinical Measurements: Goal: Will remain free from infection Outcome: Progressing Goal: Diagnostic test results will improve Outcome: Progressing Goal: Respiratory complications will improve Outcome: Progressing Goal: Cardiovascular complication will be avoided Outcome: Progressing   Problem: Pain Managment: Goal: General experience of comfort will improve Outcome: Progressing   Pt is involved in and agrees with the plan of care. V/S stable. Reports pain on her lower abd; Dilaudid IV given. MRI head done this shift. Xanax given. Result relayed to oncall provider. Dr Tasia Catchings aware as well. PAD in place; slight bleeding noted and leaked on the side of the dressing when pt got up to use the commode.

## 2021-11-10 ENCOUNTER — Encounter: Payer: Self-pay | Admitting: Internal Medicine

## 2021-11-10 LAB — CBC
HCT: 32.9 % — ABNORMAL LOW (ref 36.0–46.0)
Hemoglobin: 10.5 g/dL — ABNORMAL LOW (ref 12.0–15.0)
MCH: 29.4 pg (ref 26.0–34.0)
MCHC: 31.9 g/dL (ref 30.0–36.0)
MCV: 92.2 fL (ref 80.0–100.0)
Platelets: 249 10*3/uL (ref 150–400)
RBC: 3.57 MIL/uL — ABNORMAL LOW (ref 3.87–5.11)
RDW: 12.7 % (ref 11.5–15.5)
WBC: 10.1 10*3/uL (ref 4.0–10.5)
nRBC: 0 % (ref 0.0–0.2)

## 2021-11-10 LAB — BASIC METABOLIC PANEL
Anion gap: 5 (ref 5–15)
BUN: 14 mg/dL (ref 8–23)
CO2: 28 mmol/L (ref 22–32)
Calcium: 9.3 mg/dL (ref 8.9–10.3)
Chloride: 102 mmol/L (ref 98–111)
Creatinine, Ser: 0.88 mg/dL (ref 0.44–1.00)
GFR, Estimated: >60 mL/min (ref >60)
Glucose, Bld: 102 mg/dL — ABNORMAL HIGH (ref 70–99)
Potassium: 4.8 mmol/L (ref 3.5–5.1)
Sodium: 135 mmol/L (ref 135–145)

## 2021-11-10 LAB — HEPARIN LEVEL (UNFRACTIONATED): Heparin Unfractionated: 0.51 IU/mL (ref 0.30–0.70)

## 2021-11-10 LAB — LIPID PANEL
Cholesterol: 147 mg/dL (ref 0–200)
HDL: 45 mg/dL (ref >40)
LDL Cholesterol: 75 mg/dL (ref 0–99)
Total CHOL/HDL Ratio: 3.3 RATIO
Triglycerides: 135 mg/dL (ref <150)
VLDL: 27 mg/dL (ref 0–40)

## 2021-11-10 NOTE — Evaluation (Signed)
Occupational Therapy Evaluation Patient Details Name: Cheryl Schroeder MRN: [id number] DOB: [birth year] Today's Date: 11/10/2021   History of Present Illness Patient is a 66 year old female who reports to [facility] ED with complaints of continuous epigastric pain for the past 3 weeks. Patient admitted for acute pulmonary embolism. Left pulmonary thrombectomy for PE/DVT completed on 11/08/21. Patient has a PMH (+) for bipolar disorder, fibromyalgia, abdominal hysterectomy.   Clinical Impression   Cheryl Schroeder presents with generalized weakness, limited endurance, fatigue, and pain increased with movement. Prior to hospitalization, pt was Mod I in fxl mobility, living with her 55 year old mother and serving as her caregiver. Pt reports 6 falls in previous 6 months. During today's evaluation, she endorses 7/10 pain, requires increased time and effort for bed mobility and OOB movement, requires frequent rest breaks, and demonstrates slight unsteadiness in ambulation. Recommend ongoing OT while hospitalized, as well as HHOT post DC, to address balance, endurance, and strength, and to reduce risk of falls, further decline in fxl mobility, and/or rehospitalization. Recommend trial of DME (pt has both canes and RW at home) to improve stability while ambulating, as well as use of shower chair while bathing (pt also has chair at home), given pt's self-report of feeling nervous re: showering   Recommendations for follow up therapy are one component of a multi-disciplinary discharge planning process, led by the attending physician.  Recommendations may be updated based on patient status, additional functional criteria and insurance authorization.   Follow Up Recommendations  Home health OT    Assistance Recommended at Discharge Intermittent Supervision/Assistance  Patient can return home with the following A little help with bathing/dressing/bathroom;Assist for transportation    Functional Status Assessment  Patient  has had a recent decline in their functional status and demonstrates the ability to make significant improvements in function in a reasonable and predictable amount of time.  Equipment Recommendations  Tub/shower seat    Recommendations for Other Services       Precautions / Restrictions Precautions Precautions: Fall Restrictions Weight Bearing Restrictions: No      Mobility Bed Mobility Overal bed mobility: Needs Assistance Bed Mobility: Supine to Sit, Sit to Supine     Supine to sit: Supervision Sit to supine: Supervision   General bed mobility comments: increased time, effort. Reports lightheadness with transition from supine<sitting    Transfers Overall transfer level: Needs assistance Equipment used: Rolling walker (2 wheels) Transfers: Sit to/from Stand Sit to Stand: Supervision           General transfer comment: increased time, effort      Balance Overall balance assessment: Needs assistance Sitting-balance support: Bilateral upper extremity supported Sitting balance-Leahy Scale: Good     Standing balance support: Bilateral upper extremity supported, During functional activity Standing balance-Leahy Scale: Good Standing balance comment: movements of slight unsteadyness in standing and ambulation                           ADL either performed or assessed with clinical judgement   ADL Overall ADL's : Needs assistance/impaired                                       General ADL Comments: increased time/effort with fxl mobility, requires rest breaks; increased pain with movement     Vision Patient Visual Report: No change from baseline  Perception     Praxis      Pertinent Vitals/Pain Pain Assessment Pain Assessment: 0-10 Pain Score: 7  Pain Location: pleural chest pain Pain Descriptors / Indicators: Aching, Grimacing, Guarding Pain Intervention(s): Limited activity within patient's tolerance, Repositioned,  Monitored during session     Hand Dominance Left   Extremity/Trunk Assessment Upper Extremity Assessment Upper Extremity Assessment: Overall WFL for tasks assessed   Lower Extremity Assessment Lower Extremity Assessment: Generalized weakness   Cervical / Trunk Assessment Cervical / Trunk Assessment: Normal   Communication Communication Communication: No difficulties   Cognition Arousal/Alertness: Awake/alert Behavior During Therapy: WFL for tasks assessed/performed Overall Cognitive Status: Within Functional Limits for tasks assessed                                 General Comments: A&O x 4, pleasant and talkative     General Comments  O2 ranged from 91-95% on room air throughout session. HR ranged from 86-100bpm throughout session    Exercises Other Exercises Other Exercises: Educ re: falls prevention, energy conservation, DME, DC recs   Shoulder Instructions      Home Living Family/patient expects to be discharged to:: Private residence Living Arrangements: Parent Available Help at Discharge: Family;Available PRN/intermittently (sisters are available to help PRN) Type of Home: House Home Access: Stairs to enter [employer] of Steps: 2 Entrance Stairs-Rails: Right;Left;Can reach both Home Layout: One level     Bathroom Shower/Tub: Teacher, early years/pre: Standard Bathroom Accessibility: No   Home Equipment: Conservation officer, nature (2 wheels);Rollator (4 wheels);Cane - single point;Grab bars - tub/shower      Lives With: Family    Prior Functioning/Environment Prior Level of Function : Independent/Modified Independent             Mobility Comments: Independent w/o DME; Reports frequent falls ADLs Comments: Independent        OT Problem List: Decreased strength;Impaired balance (sitting and/or standing);Pain;Decreased activity tolerance      OT Treatment/Interventions: Self-care/ADL training;DME and/or AE  instruction;Therapeutic activities;Balance training;Therapeutic exercise;Energy conservation;Patient/family education    OT Goals(Current goals can be found in the care plan section) Acute Rehab OT Goals Patient Stated Goal: to go home OT Goal Formulation: With patient Time For Goal Achievement: 11/24/21 Potential to Achieve Goals: Good ADL Goals Pt Will Perform Lower Body Dressing: with modified independence;sitting/lateral leans Pt/caregiver will Perform Home Exercise Program: Increased ROM;Increased strength;Independently (to increase strength, flexibility, endurance, and to promote pain relief) Additional ADL Goal #1: Pt will identify/demonstrate 2+ falls prevention techniques  OT Frequency: Min 2X/week    Co-evaluation              AM-PAC OT "6 Clicks" Daily Activity     Outcome Measure Help from another person eating meals?: None Help from another person taking care of personal grooming?: None Help from another person toileting, which includes using toliet, bedpan, or urinal?: None Help from another person bathing (including washing, rinsing, drying)?: A Little Help from another person to put on and taking off regular upper body clothing?: None Help from another person to put on and taking off regular lower body clothing?: A Little 6 Click Score: 22   End of Session Equipment Utilized During Treatment: Rolling walker (2 wheels)  Activity Tolerance: Patient tolerated treatment well Patient left: in bed;with family/visitor present;with bed alarm set;with call bell/phone within reach  OT Visit Diagnosis: Unsteadiness on feet (R26.81);Muscle weakness (generalized) (M62.81)  Time: 0786-7544 OT Time Calculation (min): 26 min Charges:  OT General Charges $OT Visit: 1 Visit OT Evaluation $OT Eval Low Complexity: 1 Low OT Treatments $Self Care/Home Management : 23-37 mins Josiah Lobo, PhD, MS, OTR/L 11/10/21, 1:02 PM

## 2021-11-10 NOTE — Consult Note (Signed)
ANTICOAGULATION CONSULT NOTE   Pharmacy Consult for Heparin dosing Indication: pulmonary embolus  Allergies  Allergen Reactions   Sulfa Antibiotics Nausea Only and Nausea And Vomiting    Patient Measurements: Height: 5' 5.51" (166.4 cm) Weight: 54.6 kg (120 lb 4.8 oz) IBW/kg (Calculated) : 58.18 Heparin Dosing Weight: 52.2kg  Vital Signs: Temp: 98.2 F (36.8 C) (02/05 0457) Temp Source: Oral (02/05 0457) BP: 111/63 (02/05 0457) Pulse Rate: 79 (02/05 0457)  Labs: Recent Labs    11/07/21 0955 11/08/21 0556 11/08/21 0556 11/09/21 0219 11/10/21 0441  HGB  --  10.6*   < > 10.1* 10.5*  HCT  --  33.0*  --  32.1* 32.9*  PLT  --  163  --  229 249  APTT 68* 67*  --   --   --   HEPARINUNFRC  --  0.60  --  0.49 0.51  CREATININE  --  0.89  --  0.95 0.88  TROPONINIHS  --  188*  --   --   --    < > = values in this interval not displayed.     Estimated Creatinine Clearance: 54.2 mL/min (by C-G formula based on SCr of 0.88 mg/dL).   Medical History: Past Medical History:  Diagnosis Date   Bipolar 2 disorder ([place])    Dysplastic nevus    R calf, txted in past, pt states ~2002   Fibromyalgia    Psoriasis     Medications:  Apixaban 5mg  BID (for DVT lower extremity)  Assessment: patient is a 66 year old female recently diagnosed with a DVT about 3 weeks ago who is on Eliquis who presents with some left upper abdominal pain and back pain for several weeks.CT positive for pulmonary emboli.  2/3 0656 HL 0.60, aPTT 67  -- Therapuetic  Goal of Therapy:  Heparin level 0.3-0.7 units/ml aPTT 66-102 seconds Monitor platelets by anticoagulation protocol: Yes   Plan:  2/5: HL @ 0441 = 0.51, therapeutic X 3 Will continue pt on current rate and recheck HL on 2/6 with AM labs.   Orene Desanctis, PharmD Clinical Pharmacist   11/10/2021,5:30 AM

## 2021-11-10 NOTE — Assessment & Plan Note (Addendum)
--   Asymptomatic, no hypoxia, hemodynamics stable.  Although troponins were elevated consistent with demand ischemia, 2D echocardiogram reassuring.  Status post thrombectomy --Continue heparin, can change to oral anticoagulation after biopsy (if needed).  Pulmonology and oncology recommended discontinuation of apixaban and initiation of Xarelto on discharge given PE while on Eliquis. 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

## 2021-11-10 NOTE — Evaluation (Signed)
Speech Language Pathology Evaluation Patient Details Name: Cheryl Schroeder MRN: [id number] DOB: 12/19/1955 Today's Date: 11/10/2021 Time: 4098-1191 SLP Time Calculation (min) (ACUTE ONLY): 35 min  Problem List:  Patient Active Problem List   Diagnosis Date Noted   Demand ischemia ([place]) 11/09/2021   Acquired thrombophilia ([place]) 11/09/2021   DVT of leg (deep venous thrombosis) ([place]) 11/09/2021   Thyroid nodule 11/09/2021   Renal lesion 11/09/2021   Brain mass 11/09/2021   CVA (cerebral vascular accident) ([place]) 11/09/2021   Body mass index (BMI) 19.9 or less, adult 11/09/2021   Malignant pleural effusion    Mass of right lung    Bone lesion    Liver lesion    Acute pulmonary embolism ([place]) 11/06/2021   Pulmonary emboli ([place]) 11/06/2021   Plantar fasciitis 07/09/2014   Past Medical History:  Past Medical History:  Diagnosis Date   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 female with medical history significant of recently diagnosed left leg DVT 3 weeks ago on Eliquis, fibromyalgia, bipolar disorder, HLD, chronic peripheral neuropathy, presented with worsening of chest pains, and frequent falls.     Symptoms started about 3 weeks ago, symptoms almost developed the same time, which includes left leg swelling and pain, " tenderness of bilateral rib cages" pleural chest pain  worsening with deep breath and cough, and frequent feeling of lightheadedness and to fall several times last week.  No loss of conscious no head injury.  Denies any fever chills, no cough.  She has been taking increasing dose of hydrocodone for the chest pain with little help.     ED Course: No hypoxia, no hypotension no tachycardia.     CTA positive for acute saddle embolism in the left main pulm artery extending to the left upper lobe and left lower branches, and there are few small filling defects in the subsegmental branches in the right middle lobe and right lower lobe.  Also there is a 4.8 cm homogenous opacity in the anterior right middle lung field, suspicious for primary neoplasm.  Multiple hypodensity lesions on ribs, thoracic and lumbar spine suspicious for metastasis."   MRI Brain 11/09/21 "1. Two round, peripherally enhancing lesions in the left frontal lobe, concerning for metastatic disease. Numerous additional punctate and linear areas of enhancement are also seen, some of which may represent additional metastatic lesions, but some of which may represent enhancing, subacute infarcts. 2. Numerous foci of restricted diffusion in the bilateral cerebral and cerebellar hemispheres, some of which demonstrate ADC correlates and some of which may have normalized ADC values. These are concerning for acute and subacute infarcts, likely embolic in etiology given multiple vascular territories. 3. Abnormal enhancement in C1, C2, and the right occipital calvarium, concerning for osseous metastatic disease."  Assessment / Plan /  Recommendation Clinical Impression  Pt seen for cognitive-linguistic assessment. Assessment completed via informal means and portions of Cognistat. Pt alert, pleasant, and cooperative. Sister at bedside.   Based on today's assessment, pt presents with cognitive-linguistic deficits affecting attention (sustained) and memory (immediate, short term, and working). Pt also with  decreased insight into current cognitive-linguistic deficits and functional implications of cognitive-linguistic deficits. Pt's speech is fluent, appropriate, and without s/sx dysarthria or anomia. Intact confrontation naming, generative naming, conversational speech, and repetition noted.  Recommend post-acute SLP services targeting cognitive-linguistic deficits. Anticipate need for frequent or constant supervision/assistance at d/c (including assistance with IADLs, no driving until cleared by MD).   Pt and pt's sister made aware of results of SLP evaluation, changes to cognitive-linguistic ability noted on evaluation, d/c recommendations, and SLP POC. Pt and sister verbalized understanding/agreement. RN also made aware of findings from evaluation and SLP POC.    SLP Assessment  SLP Recommendation/Assessment: Patient needs continued [facility] SLP Visit Diagnosis: Cognitive communication deficit (R41.841)    Recommendations for follow up therapy are one component of a multi-disciplinary discharge planning process, led by the attending physician.  Recommendations may be updated based on patient status, additional functional criteria and insurance authorization.    Follow Up Recommendations  Home health SLP (vs outpatient SLP services)    Assistance Recommended at Discharge  Frequent or constant Supervision/Assistance  Functional Status Assessment Patient has had a recent decline in their functional status and demonstrates the ability to make significant improvements in function in a reasonable and predictable amount of time.        SLP Evaluation Cognition  Overall Cognitive Status: Within Functional Limits for tasks assessed Arousal/Alertness: Awake/alert Orientation Level: Oriented X4 Year: 2023 Month: February (stated "September" initially; self-corrected to February) Day of Week: Correct Memory: Impaired Memory Impairment: Decreased short term  memory;Retrieval deficit;Decreased recall of new information;Storage deficit Decreased Short Term Memory: Verbal basic Awareness: Impaired (reduced insight into CLOF) Problem Solving: Appears intact (verbal) Safety/Judgment: Appears intact       Comprehension  Auditory Comprehension Overall Auditory Comprehension: Appears within functional limits for tasks assessed Yes/No Questions: Within Functional Limits Commands: Within Functional Limits (1-2 step) Other Conversation Comments: appeared [facility] Reading Comprehension Reading Status:  (DNT)    Expression Expression Primary Mode of Expression: Verbal Verbal Expression Overall Verbal Expression: Appears within functional limits for tasks assessed Initiation: No impairment Automatic Speech:  (WFL) Repetition: No impairment Naming: No impairment Pragmatics: No impairment Written Expression Dominant Hand: Left Written Expression:  (DNT)   Oral / Motor  Oral Motor/Sensory Function Overall Oral Motor/Sensory Function: Within functional limits Motor Speech Overall Motor Speech: Appears within functional limits for tasks assessed Respiration: Within functional limits Phonation: Normal Resonance: Within functional limits Articulation: Within functional limitis Intelligibility: Intelligible Motor Planning: Witnin functional limits           Cherrie Gauze, M.S., [facility] [phone] ([place])  Quintella Baton 11/10/2021, 1:22 PM

## 2021-11-10 NOTE — Assessment & Plan Note (Signed)
--   Asymptomatic with no focal symptoms reported or demonstrated on exam.  Stroke work-up complete.  Neurology recommended continuing anticoagulation.

## 2021-11-10 NOTE — Assessment & Plan Note (Signed)
--   Worrisome for malignancy.  Continue work-up for underlying mass.

## 2021-11-10 NOTE — Evaluation (Addendum)
Physical Therapy Evaluation Patient Details Name: Cheryl Schroeder MRN: [id number] DOB: 07/29/1956 Today's Date: 11/10/2021  History of Present Illness  Patient is a 66 year old female who reports to [facility] ED with complaints of continuous epigastric pain for the past 3 weeks. Patient admitted for acute pulmonary embolism. Left pulmonary thrombectomy for PE/DVT completed on 11/08/21. Patient has a PMH (+) for bipolar disorder, fibromyalgia, abdominal hysterectomy.  Clinical Impression  Patient tolerated session well and was agreeable to PT evaluation. Patient reported no pain throughout session, and vitals stable and WFL throughout treatment session. Patient reports she was independent/Mod I with all ADLs and mobility prior to hospitalization. She lives in a 1 story home with her mother who is 40 years old. Patient is mothers caregiver, and she has 2 STE with bilateral hand rails. Patient does report having 3-4 falls within the last 6 months, however states she has always been fall prone for years.   During evaluation, patient demonstrated Independent/Mod I with bed mobility and transfers without an AD. For bed mobility patient did utilize bed rail to complete supine to sit transfer, however was able to complete sit to supine without any assistance. Completing sit to stand transfers and ambulating around the nurses station 2 times was completed without an AD and no loses of balance or unsteadiness noted. Patient does ambulate with NBOS and decreased bilateral arm swing (potentially due to the IV in both arms). Patient was able to demonstrate ascending and descending 3 steps with right hand rail Mod I with a step to pattern and no LOB noted. Per patient, she is demonstrating near/at baseline level of function and does not require additional physical therapy. Signing off.       Recommendations for follow up therapy are one component of a multi-disciplinary discharge planning process, led by the attending  physician.  Recommendations may be updated based on patient status, additional functional criteria and insurance authorization.  Follow Up Recommendations No PT follow up    Assistance Recommended at Discharge None  Patient can return home with the following       Equipment Recommendations None recommended by PT  Recommendations for Other Services       Functional Status Assessment Patient has had a recent decline in their functional status and demonstrates the ability to make significant improvements in function in a reasonable and predictable amount of time.     Precautions / Restrictions Precautions Precautions: Fall Restrictions Weight Bearing Restrictions: No      Mobility  Bed Mobility Overal bed mobility: Modified Independent (L UE support from bed rail)                  Transfers Overall transfer level: Independent Equipment used: None (no unsteadiness noted)                    Ambulation/Gait Ambulation/Gait assistance: Modified independent (Device/Increase time) Gait Distance (Feet): 320 Feet (completed 3 steps in stair well within ambualtion bout with no rest) Assistive device: None Gait Pattern/deviations: Step-through pattern, Decreased step length - right, Decreased step length - left, Narrow base of support Gait velocity: mildly decreased     General Gait Details: decreased reciprical arm swing bilaterally (potentially from the IVs in both arms); no LOB or unsteadiness ntoed  Stairs Stairs: Yes Stairs assistance: Modified independent (Device/Increase time) Stair Management: One rail Right Number of Stairs: 3 General stair comments: no unstadiness noted; increased time and effort, completed with a step to pattern  Wheelchair Mobility    Modified Rankin (Stroke Patients Only)       Balance Overall balance assessment: Modified Independent                                           Pertinent Vitals/Pain Pain  Assessment Pain Assessment: No/denies pain    Home Living Family/patient expects to be discharged to:: Private residence Living Arrangements: Parent Available Help at Discharge: Family (patient's mother) Type of Home: House Home Access: Stairs to enter Entrance Stairs-Rails: Right;Left;Can reach both Entrance Stairs-Number of Steps: 2   Home Layout: One level Home Equipment: Conservation officer, nature (2 wheels);Rollator (4 wheels);Cane - single point;Grab bars - tub/shower      Prior Function Prior Level of Function : Independent/Modified Independent             Mobility Comments: Independent ADLs Comments: Independent     Hand Dominance   Dominant Hand: Left    Extremity/Trunk Assessment   Upper Extremity Assessment Upper Extremity Assessment: Overall WFL for tasks assessed    Lower Extremity Assessment Lower Extremity Assessment: Generalized weakness (4-/5 strength bilaterally, patient reports this is baseline)       Communication   Communication: No difficulties  Cognition Arousal/Alertness: Awake/alert Behavior During Therapy: WFL for tasks assessed/performed Overall Cognitive Status: Within Functional Limits for tasks assessed                                 General Comments: A&Ox3 situation, location, year        General Comments General comments (skin integrity, edema, etc.): O2 ranged from 91-95% on room air throughout session. HR ranged from 86-100bpm throughout session    Exercises Other Exercises Other Exercises: patient educated on role of PT, fall risk, and d/c recommendations Other Exercises: utilized the bathroom and bathroom hygiene independently   Assessment/Plan    PT Assessment Patient does not need any further PT services  PT Problem List         PT Treatment Interventions      PT Goals (Current goals can be found in the Care Plan section)  Acute Rehab PT Goals Patient Stated Goal: to go home PT Goal Formulation: With  patient Time For Goal Achievement: 11/24/21 Potential to Achieve Goals: Good    Frequency       Co-evaluation               AM-PAC PT "6 Clicks" Mobility  Outcome Measure Help needed turning from your back to your side while in a flat bed without using bedrails?: None Help needed moving from lying on your back to sitting on the side of a flat bed without using bedrails?: None Help needed moving to and from a bed to a chair (including a wheelchair)?: None Help needed standing up from a chair using your arms (e.g., wheelchair or bedside chair)?: None Help needed to walk in hospital room?: None Help needed climbing 3-5 steps with a railing? : None 6 Click Score: 24    End of Session Equipment Utilized During Treatment: Gait belt Activity Tolerance: Patient tolerated treatment well;No increased pain Patient left: in bed;with call bell/phone within reach;with nursing/sitter in room;with family/visitor present Nurse Communication: Mobility status PT Visit Diagnosis: Muscle weakness (generalized) (M62.81)    Time: 3976-7341 PT Time Calculation (min) (ACUTE ONLY): 25 min  Charges:   PT Evaluation $PT Eval Low Complexity: 1 Low PT Treatments $Gait Training: 8-22 mins        Iva Boop, PT  11/10/21. 10:07 AM

## 2021-11-10 NOTE — Progress Notes (Addendum)
Progress Note   Patient: Cheryl Schroeder:[id number] DOB: [birth year] DOA: 11/06/2021     4 DOS: the patient was seen and examined on 11/10/2021   Brief hospital course: 66 year old woman PMH recent outpatient diagnosis DVT treated with apixaban, presented with chest pain and frequent falls.  CT revealed acute saddle embolism and multiple PE as well as right middle lung mass and skeletal lesions concerning for malignancy.  Seen by pulmonology with recommendation for malignancy work-up. S/p right-sided thoracentesis. Seen by vascular surgery and underwent left pulmonary mechanical thrombectomy as well as placement of IVC.  Brain MRI concerning for metastatic disease as well as subacute infarcts.  Neurology consulted.  Awaiting cytology from thoracentesis, if negative will need biopsy.  Given need for anticoagulation, obtain biopsy prior to transition to oral anticoagulation.  Once biopsy completed, anticipate discharge home. --2/5 Overall doing well, no PT needed on discharge. Once cytology/biopsy complete, can discharge home.  Assessment and Plan: * Acute pulmonary embolism ([place]) -- Asymptomatic, no hypoxia, hemodynamics stable.  Although troponins were elevated consistent with demand ischemia, 2D echocardiogram reassuring.  Status post thrombectomy --Continue heparin, can change to oral anticoagulation after biopsy (if needed).  Pulmonology and oncology recommended discontinuation of apixaban and initiation of Xarelto on discharge given PE while on Eliquis. -- Presumed secondary to acquired thrombophilia from presumed malignancy  CVA (cerebral vascular accident) ([place]) -- Asymptomatic with no focal symptoms reported or demonstrated on exam.  Stroke work-up complete.  Neurology recommended continuing anticoagulation.  Brain mass -- Worrisome for malignancy.  Continue work-up for underlying mass.  Liver lesion -- Worrisome for metastatic disease  Bone lesion -- Worrisome for metastatic disease,  work-up in progress  Mass of right lung -- Concerning for primary lung cancer, imaging concerning for metastatic disease to skeleton, liver, brain.  Follow-up cytology from pleural effusion.  If negative, proceed with biopsy per interventional radiology or pulmonology.  Outpatient PET scan.  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  Lifelong anticoagulation.  Demand ischemia ([place]) -- Asymptomatic.  Secondary to acute PE, echocardiogram reassuring, EKG nonacute no further evaluation suggested  Renal lesion --left superior renal pole, no signs or symptoms to suggest infection.  Renal function stable.  Follow-up as an outpatient.  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   Subjective:  Feels good Slept well No weakness No neuro complaints  Physical Exam: Vitals:   11/09/21 2346 11/10/21 0457 11/10/21 0831 11/10/21 1117  BP: 100/64 111/63 103/68 105/64  Pulse: 78 79 83 89  Resp: 19 17 17 16   Temp: 98.5 F (36.9 C) 98.2 F (36.8 C) 98.6 F (37 C) 98.1 F (36.7 C)  TempSrc: Oral Oral  Axillary  SpO2: 95% 98% 94% 94%  Weight:      Height:       Physical Exam Vitals reviewed.  Constitutional:      General: She is not in acute distress.    Appearance: She is not ill-appearing or toxic-appearing.  Cardiovascular:     Rate and Rhythm: Normal rate and regular rhythm.     Heart sounds: No murmur heard. Pulmonary:     Effort: Pulmonary effort is normal.     Breath sounds: No wheezing or rales.  Neurological:     Mental Status: She is alert.  Psychiatric:  Mood and Affect: Mood normal.        Behavior: Behavior normal.     Data Reviewed:  BMP, CBC noted  Family Communication: sister at bedside  Disposition: Status  is: Inpatient  Remains inpatient appropriate because: needs biopsy while on anticoagulation    Planned Discharge Destination: Home     Time spent: 25 minutes  Author: Murray Hodgkins, MD 11/10/2021 1:04 PM  For on call review [email or web address].

## 2021-11-10 NOTE — Progress Notes (Signed)
CTA H&N showed lytic lesions in the cervical spine (C6) and possible multifocal infection in the lung but no hemodynamically significant stenosis. No change to recommendations from yesterday's full consult note. Stroke workup is complete, no further inpatient stroke workup indicated. Please place ambulatory referral to neurology at time of hospital discharge. Neurology to sign off, but please re-engage if any additional concerns arise.  Su Monks, MD [facility] [phone]  If 7pm- 7am, please page neurology on call as listed in [place].

## 2021-11-10 NOTE — Assessment & Plan Note (Signed)
--   Asymptomatic.  Secondary to acute PE, echocardiogram reassuring, EKG nonacute no further evaluation suggested

## 2021-11-10 NOTE — Assessment & Plan Note (Signed)
--   Concerning for primary lung cancer, imaging concerning for metastatic disease to skeleton, liver, brain.  Follow-up cytology from pleural effusion.  If negative, proceed with biopsy per interventional radiology or pulmonology.  Outpatient PET scan.

## 2021-11-10 NOTE — Assessment & Plan Note (Signed)
--   Worrisome for metastatic disease, work-up in progress

## 2021-11-10 NOTE — Assessment & Plan Note (Signed)
--   Worrisome for metastatic disease

## 2021-11-11 ENCOUNTER — Inpatient Hospital Stay: Payer: Medicare HMO

## 2021-11-11 ENCOUNTER — Encounter: Payer: Self-pay | Admitting: Vascular Surgery

## 2021-11-11 DIAGNOSIS — C787 Secondary malignant neoplasm of liver and intrahepatic bile duct: Secondary | ICD-10-CM

## 2021-11-11 DIAGNOSIS — J90 Pleural effusion, not elsewhere classified: Secondary | ICD-10-CM

## 2021-11-11 DIAGNOSIS — S36112A Contusion of liver, initial encounter: Secondary | ICD-10-CM | POA: Diagnosis not present

## 2021-11-11 DIAGNOSIS — G9389 Other specified disorders of brain: Secondary | ICD-10-CM

## 2021-11-11 DIAGNOSIS — I639 Cerebral infarction, unspecified: Secondary | ICD-10-CM

## 2021-11-11 LAB — BASIC METABOLIC PANEL
Anion gap: 3 — ABNORMAL LOW (ref 5–15)
BUN: 19 mg/dL (ref 8–23)
CO2: 30 mmol/L (ref 22–32)
Calcium: 9 mg/dL (ref 8.9–10.3)
Chloride: 107 mmol/L (ref 98–111)
Creatinine, Ser: 0.82 mg/dL (ref 0.44–1.00)
GFR, Estimated: >60 mL/min (ref >60)
Glucose, Bld: 103 mg/dL — ABNORMAL HIGH (ref 70–99)
Potassium: 4.1 mmol/L (ref 3.5–5.1)
Sodium: 140 mmol/L (ref 135–145)

## 2021-11-11 LAB — CBC
HCT: 29.2 % — ABNORMAL LOW (ref 36.0–46.0)
Hemoglobin: 9.3 g/dL — ABNORMAL LOW (ref 12.0–15.0)
MCH: 29.7 pg (ref 26.0–34.0)
MCHC: 31.8 g/dL (ref 30.0–36.0)
MCV: 93.3 fL (ref 80.0–100.0)
Platelets: 235 10*3/uL (ref 150–400)
RBC: 3.13 MIL/uL — ABNORMAL LOW (ref 3.87–5.11)
RDW: 13.1 % (ref 11.5–15.5)
WBC: 8.2 10*3/uL (ref 4.0–10.5)
nRBC: 0 % (ref 0.0–0.2)

## 2021-11-11 LAB — HEPARIN LEVEL (UNFRACTIONATED): Heparin Unfractionated: 0.39 IU/mL (ref 0.30–0.70)

## 2021-11-11 LAB — HEMOGLOBIN A1C
Hgb A1c MFr Bld: 5 % (ref 4.8–5.6)
Mean Plasma Glucose: 96.8 mg/dL

## 2021-11-11 MED ORDER — MIDAZOLAM HCL 5 MG/5ML IJ SOLN
INTRAMUSCULAR | Status: DC | PRN
  Administered 2021-11-11: 1 mg via INTRAVENOUS

## 2021-11-11 MED ORDER — FENTANYL CITRATE (PF) 100 MCG/2ML IJ SOLN
INTRAMUSCULAR | Status: AC
  Filled 2021-11-11: qty 2

## 2021-11-11 MED ORDER — MIDAZOLAM HCL 2 MG/2ML IJ SOLN
INTRAMUSCULAR | Status: AC
  Filled 2021-11-11: qty 2

## 2021-11-11 MED ORDER — FENTANYL CITRATE (PF) 100 MCG/2ML IJ SOLN
INTRAMUSCULAR | Status: DC | PRN
  Administered 2021-11-11: 50 ug via INTRAVENOUS

## 2021-11-11 NOTE — Assessment & Plan Note (Signed)
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 --s/p throacentesis --cytology pending

## 2021-11-11 NOTE — Consult Note (Signed)
Chief Complaint: Patient was seen in consultation today for liver lesion biopsy.  Referring Physician(s): Goodrich,Daniel P  Supervising Physician: Juliet Rude  Patient Status: [place] - In-pt  History of Present Illness: Cheryl Schroeder is a 66 y.o. female with a past medical history significant for bipolar disorder, fibromyalgia, CVA, DVT who presented to [facility] ED on 11/06/21 with complaints of chest pain and frequent falls. She was found to have saddle PE as well as a right sided lung mass, skeletal lesions, brain lesions and liver lesions concerning for metastatic disease. She underwent right thoracentesis 2/2 in IR (cytology pending). IR has been consulted for liver lesion biopsy to further direct care.  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   Acute pulmonary embolism ([place]) 11/06/2021   Bipolar 2 disorder (HCC)    Body mass index (BMI) 19.9 or less, adult 11/09/2021   CVA (cerebral vascular accident) ([place]) 11/09/2021   DVT of leg (deep venous thrombosis) ([place]) 11/09/2021   Dysplastic nevus    R calf, txted in past, pt states ~2002   Fibromyalgia    Psoriasis    Thyroid nodule 11/09/2021    Past Surgical History:  Procedure Laterality Date   ABDOMINAL HYSTERECTOMY     ADENOIDECTOMY     BREAST BIOPSY Right 1990   cyst   PULMONARY THROMBECTOMY Left 11/08/2021   Procedure: PULMONARY THROMBECTOMY;  Surgeon: Katha Cabal, MD;  Location: [facility];  Service: Cardiovascular;  Laterality: Left;   TONSILLECTOMY      Allergies: Sulfa antibiotics  Medications: Prior to Admission medications   Medication Sig Start Date End Date Taking? Authorizing Provider  ALPRAZolam Duanne Moron) 0.5 MG tablet Take 0.5-1 mg by mouth at bedtime as needed. 10/25/21  Yes [provider]  ELIQUIS 5 MG TABS tablet Take 5 mg by mouth 2 (two) times daily. 10/09/21  Yes [provider]  FLUoxetine (PROZAC)  40 MG capsule Take 40 mg by mouth daily.   Yes [provider]  gabapentin (NEURONTIN) 300 MG capsule Take 300 mg by mouth 3 (three) times daily. Three pills at night as needed   Yes [provider]  HYDROcodone-acetaminophen (NORCO/VICODIN) 5-325 MG per tablet Take 1 tablet by mouth as needed for moderate pain.   Yes [provider]  lamoTRIgine (LAMICTAL) 200 MG tablet Take 200 mg by mouth daily. 04/27/14  Yes [provider]  omeprazole (PRILOSEC) 40 MG capsule Take 40 mg by mouth daily.   Yes [provider]  pravastatin (PRAVACHOL) 40 MG tablet Take 40 mg by mouth daily.   Yes [provider]  acetaminophen (TYLENOL) 500 MG tablet Take 500-1,000 mg by mouth as needed.    [provider]  amoxicillin-clavulanate (AUGMENTIN) 875-125 MG tablet SMARTSIG:1 Tablet(s) By Mouth Every 12 Hours Patient not taking: Reported on 11/06/2021 10/22/21   [provider]  Calcium Carbonate-Vitamin D (CALCIUM + D PO) Take by mouth daily. Patient not taking: Reported on 11/06/2021    [provider]  Fluocinolone Acetonide Body 0.01 % OIL Apply 1 application topically daily. Daily for 2 weeks then decrease to 5 times per week as needed. 08/06/21 02/02/22  Ralene Bathe, MD  fluticasone (FLONASE) 50 MCG/ACT nasal spray Place 1 spray into both nostrils daily.    [provider]     Family History  Problem Relation Age of Onset   Breast cancer Maternal Aunt 10   Lung  cancer Father     Social History   Socioeconomic History   Marital status: Single    Spouse name: Not on file   Number of children: Not on file   Years of education: Not on file   Highest education level: Not on file  Occupational History   Not on file  Tobacco Use   Smoking status: Never   Smokeless tobacco: Never  Vaping Use   Vaping Use: Never used  Substance and Sexual Activity   Alcohol use: No   Drug use: No   Sexual activity: Not on file   Other Topics Concern   Not on file  Social History Narrative   Not on file   Social Determinants of Health   Financial Resource Strain: Not on file  Food Insecurity: Not on file  Transportation Needs: Not on file  Physical Activity: Not on file  Stress: Not on file  Social Connections: Not on file     Review of Systems: A 12 point ROS discussed and pertinent positives are indicated in the HPI above.  All other systems are negative.  Review of Systems  Constitutional:  Negative for chills and fever.  Respiratory:  Negative for cough and shortness of breath.   Cardiovascular:  Negative for chest pain.  Gastrointestinal:  Positive for abdominal pain. Negative for nausea and vomiting.  Musculoskeletal:  Negative for back pain.  Neurological:  Positive for weakness.   Vital Signs: BP 116/68 (BP Location: Left Arm)    Pulse 90    Temp 98.4 F (36.9 C) (Oral)    Resp 20    Ht 5' 5.51" (1.664 m)    Wt 123 lb 7.3 oz (56 kg)    SpO2 92%    BMI 20.22 kg/m   Physical Exam Vitals and nursing note reviewed.  Constitutional:      General: She is not in acute distress. HENT:     Head: Normocephalic.     Mouth/Throat:     Mouth: Mucous membranes are moist.     Pharynx: Oropharynx is clear. No oropharyngeal exudate or posterior oropharyngeal erythema.  Cardiovascular:     Rate and Rhythm: Normal rate.  Pulmonary:     Effort: Pulmonary effort is normal.  Abdominal:     General: There is no distension.     Palpations: Abdomen is soft.     Tenderness: There is no abdominal tenderness.  Skin:    General: Skin is warm and dry.  Neurological:     Mental Status: She is alert and oriented to person, place, and time.  Psychiatric:        Mood and Affect: Mood normal.        Thought Content: Thought content normal.     MD Evaluation Airway: WNL Heart: WNL Abdomen: WNL Chest/ Lungs: WNL ASA  Classification: 3 Mallampati/Airway Score: Two   Imaging: CT ANGIO HEAD NECK W WO  CM  Result Date: 11/09/2021 CLINICAL DATA:  Stroke suspected EXAM: CT ANGIOGRAPHY HEAD AND NECK TECHNIQUE: Multidetector CT imaging of the head and neck was performed using the standard protocol during bolus administration of intravenous contrast. Multiplanar CT image reconstructions and MIPs were obtained to evaluate the vascular anatomy. Carotid stenosis measurements (when applicable) are obtained utilizing NASCET criteria, using the distal internal carotid diameter as the denominator. RADIATION DOSE REDUCTION: This exam was performed according to the departmental dose-optimization program which includes automated exposure control, adjustment of the mA and/or kV according to patient size and/or use of  iterative reconstruction technique. CONTRAST:  50mL OMNIPAQUE IOHEXOL 350 MG/ML SOLN COMPARISON:  No prior CT or CTA clinical correlation is made with MRI 11/09/2021; FINDINGS: CT HEAD FINDINGS Brain: No evidence of acute cortical infarction, hemorrhage, cerebral edema, mass, mass effect, or midline shift. No hydrocephalus or extra-axial fluid collection. Vascular: No hyperdense vessel. Skull: Normal. Negative for fracture or focal lesion. Sinuses/Orbits: No acute finding. Other: Fluid in the right mastoid air cells. CTA NECK FINDINGS Aortic arch: Standard branching. Imaged portion shows no evidence of aneurysm or dissection. No significant stenosis of the major arch vessel origins. Right carotid system: No evidence of dissection, stenosis (50% or greater) or occlusion. Left carotid system: No evidence of dissection, stenosis (50% or greater) or occlusion. Vertebral arteries: The left vertebral artery is patent from its origin to the skull base. The right vertebral artery is diminutive, likely congenitally given the size of the vertebral artery foramen, and is patent to the skull base. No evidence of dissection, occlusion, or hemodynamically significant stenosis (greater than 50%). Skeleton: Numerous lytic lesions  in the cervical and imaged thoracic spine. Possible mild vertebral body height loss at C6. Other neck: Hypoenhancing lesion in the left thyroid lobe measures up to 2.2 cm. Upper chest: Redemonstrated saddle embolus in the left main pulmonary artery, extending into the branch vessels, which was better evaluated on the 11/06/2021 CTA. Slight decrease in the size of a right pleural effusion, with new trace left pleural effusion. New ground-glass and patchy opacities in the right upper lobe (series 9, images 234, 237, 275, and 305) and superior right lower lobe (series 9, image 310), with redemonstrated opacities in the left upper lobe (series 9, image 232) and medial right upper lobe (series 9, image 295). Partially imaged inferior right upper lobe mass. Review of the MIP images confirms the above findings CTA HEAD FINDINGS Anterior circulation: Both internal carotid arteries are patent to the termini, without significant stenosis. A1 segments patent. Normal anterior communicating artery. Anterior cerebral arteries are patent to their distal aspects. No M1 stenosis or occlusion. Normal MCA bifurcations. Distal MCA branches perfused and symmetric. Posterior circulation: The right vertebral artery is quite diminutive in the proximal V4 segment and further decreased in caliber after the takeoff of the PICA (series 9, image 129); while this may represent a degree of stenosis, it could also be due to normal branching of an already diminutive vessel. The dominant left vertebral artery is patent to the vertebrobasilar junction. Posterior inferior cerebral arteries patent bilaterally. Basilar patent to its distal aspect. Superior cerebellar arteries patent bilaterally. Patent right posterior communicating artery, with a hypoplastic right P1 and near fetal origin of the right PCA. The left P1 is patent, with a small patent left posterior communicating artery. PCAs perfused to their distal aspects without stenosis. Venous  sinuses: As permitted by contrast timing, patent. Anatomic variants: Near fetal origin of the right PCA. Review of the MIP images confirms the above findings IMPRESSION: 1. Diminutive extracranial right vertebral artery, likely congenital, with further diminution of the intracranial right V4 after the takeoff of the right PICA, which may reflect a degree of stenosis and/or normal post-branching caliber of an already diminutive vessel. The left vertebral artery is dominant and patent. No intracranial large vessel occlusion or other hemodynamically significant stenosis. 2.  No hemodynamically significant stenosis in the neck. 3. Partially imaged right mid lung mass, with increased ground-glass opacities in the imaged right upper and lower lobes, concerning for worsening multifocal infection. 4. Slightly decreased right  pleural effusion, with new trace left pleural effusion. 5. Lytic lesions in the cervical spine, with possible mild vertebral body height loss in C6. 6. Hypoenhancing lesion in the left thyroid lobe, which measures up to 2.2 cm. If this has not previously been evaluated, a non-emergent ultrasound of the thyroid is recommended. (Reference: J Am Coll Radiol. 2015 Feb;12(2): 143-50) 7. Electronically Signed   By: Merilyn Baba M.D.   On: 11/09/2021 21:09   DG Chest 2 View  Result Date: 10/22/2021 CLINICAL DATA:  Contusion of the rib on the left side. Left rib pain. EXAM: CHEST - 2 VIEW COMPARISON:  None. FINDINGS: The heart size and mediastinal contours are within normal limits. Soft tissue density mass with area of atelectasis in the right mid lung measuring approximately 3.0 x 4.9 cm. Bibasilar atelectasis. Hyperinflated lungs. No pleural effusion or pneumothorax. The visualized skeletal structures are unremarkable. IMPRESSION: 1. Soft tissue density mass in the right mid lung measuring at least 3.0 x 4.9 cm, it may represent pneumonia, loculated pleural effusion or pulmonary mass. Further evaluation  with CT examination would be helpful. Follow-up to resolution is recommended. 2. Hyperinflated lungs concerning for COPD. Bibasilar atelectasis. No pleural effusion or pneumothorax. An attempt was made to reach out to the provider Dr. Clemmie Krill without response from our office at the time of dictation. Electronically Signed   By: Keane Police D.O.   On: 10/22/2021 16:07   DG Ribs Unilateral W/Chest Left  Result Date: 11/06/2021 CLINICAL DATA:  Fall, left rib pain EXAM: LEFT RIBS AND CHEST - 3+ VIEW COMPARISON:  Chest x-ray 10/22/2021 FINDINGS: No acute fracture identified in the left ribs. Cardiomediastinal silhouette is unchanged. Persistent ovoid masslike opacity adjacent to the fissure in the right lung measuring approximately 5 x 3.2 cm, grossly unchanged. Linear opacities in the left lower lung zone likely represent subsegmental atelectasis. Hyperinflated lungs with mildly prominent interstitial lung markings. No pleural effusion or pneumothorax visualized. IMPRESSION: 1. No left rib fractures identified. 2. Persistent and unchanged ovoid perifissural masslike opacity in the right lung. 3. COPD. Electronically Signed   By: Ofilia Neas M.D.   On: 11/06/2021 12:16   CT Angio Chest PE W and/or Wo Contrast  Result Date: 11/06/2021 CLINICAL DATA:  Chest pain, recent trauma EXAM: CT ANGIOGRAPHY CHEST WITH CONTRAST TECHNIQUE: Multidetector CT imaging of the chest was performed using the standard protocol during bolus administration of intravenous contrast. Multiplanar CT image reconstructions and MIPs were obtained to evaluate the vascular anatomy. RADIATION DOSE REDUCTION: This exam was performed according to the departmental dose-optimization program which includes automated exposure control, adjustment of the mA and/or kV according to patient size and/or use of iterative reconstruction technique. CONTRAST:  159mL OMNIPAQUE IOHEXOL 350 MG/ML SOLN COMPARISON:  Chest radiographs done earlier today FINDINGS:  Cardiovascular: There is homogeneous enhancement in the thoracic aorta. There is mild ectasia of main pulmonary artery measuring 3.2 cm. There is saddle embolus in the left main pulmonary artery extending into the left upper lobe and left lower lobe branches. There are few small filling defects in the subsegmental branches in the right middle lobe and right lower lobe. There are no imaging signs of right ventricular strain. Mediastinum/Nodes: No significant lymphadenopathy seen. There is 2.5 cm low-density lesion in the enlarged left lobe of thyroid. Lungs/Pleura: There is moderate right pleural effusion. There are small patchy infiltrates in the medial aspect of right upper lobe in the right upper lung fields. There is 5.2 x 4.1 cm homogeneous density  inseparable from interlobar fissure in the anterior right parahilar region. There is narrowing of the lumen of the bronchus leading to this homogeneous opacity in the anteromedial right mid lung fields. Linear patchy infiltrates are seen in both lower lung fields, more so on the right side. In image 13 of series 6, there is 2 cm ground-glass density in the anterior left apex. There is moderate right pleural effusion. There is no significant left pleural effusion. There is no pneumothorax. Upper Abdomen: Fatty liver. Musculoskeletal: There are numerous lytic lesions of varying sizes in the left ribs, thoracic spine and lumbar spine. There is decrease in height of bodies of C6 vertebra. There is also decrease in height of bodies of T6, T7, T8 and T9 vertebrae. Review of the MIP images confirms the above findings. IMPRESSION: There is evidence of acute pulmonary embolism with small to moderate thrombus burden. There are no imaging signs of acute right ventricular strain. There is no evidence of thoracic aortic dissection. There is 4.8 cm homogeneous opacity in the anterior right mid lung fields. This lesion has to be considered primary malignant neoplasm until proven  otherwise. There is narrowing of bronchus leading to this parahilar density. PET-CT and biopsy as warranted should be considered. There are numerous lytic lesions of varying sizes along with compression fractures in the cervical and thoracic spine as described in the body of the report consistent with extensive skeletal metastatic disease. Moderate right pleural effusion. There are ground-glass and patchy alveolar infiltrates in both lungs as described in the body of the report suggesting multifocal pneumonia. There is 2.5 cm low-density nodule in the left lobe of thyroid. When the patient's clinical condition permits, thyroid sonogram may be considered. Electronically Signed   By: Elmer Picker M.D.   On: 11/06/2021 15:21   MR BRAIN W WO CONTRAST  Result Date: 11/09/2021 CLINICAL DATA:  Weight loss, brain metastases suspected EXAM: MRI HEAD WITHOUT AND WITH CONTRAST TECHNIQUE: Multiplanar, multiecho pulse sequences of the brain and surrounding structures were obtained without and with intravenous contrast. CONTRAST:  39mL GADAVIST GADOBUTROL 1 MMOL/ML IV SOLN COMPARISON:  None. FINDINGS: Brain: Round, peripherally enhancing lesion in the left inferior frontal lobe, which measures up to 9 x 9 mm (series 18, image 17), associated hemosiderin deposition, concerning for a hemorrhagic metastasis. This area is associated with hemosiderin deposition. Peripherally enhancing lesion in the left frontal lobe measures up to 4 mm (series 18, image 127). Multiple additional enhancing foci are also seen, some of which correlate with areas of restricted diffusion, described below, while some appear more masslike. These enhancing foci are seen on series 18 on the following images: 136, 120, 117, 115 113, 111, 108, 106, 104, 95, 94, 93, 92, 90, 87, 84, 80, 78, 77, 76, 70, and 59. Scattered foci of increased signal on diffusion-weighted imaging throughout the bilateral cerebral and cerebellar hemispheres, some of which  demonstrate ADC correlates; for example several cortical foci and white matter areas in the posterior right frontal lobe (series 5, image 32-34), in the left anterior frontal lobe (series 5, image 27-28), areas along the right insula (series 5, image 27), left caudate (series 5, image 28), and bilateral cerebellar hemispheres (series 5, image 12-14). Some foci of increased signal on diffusion-weighted imaging do not demonstrate ADC correlates, and may reflect subacute infarcts with normalization of the ADC. However, some of these correlate with foci of enhancement such as on series 18, image 104. No acute hemorrhage or midline shift. No hydrocephalus or extra-axial  collection Vascular: Normal flow voids. Skull and upper cervical spine: Abnormal enhancement in the arch (series 18, image 15) and bilateral lateral masses of C1 (series 18, image 8 and 10), as well as the anterior aspect of C2 (series 18, image 1). Small enhancing focus in the right occipital calvarium (series 18, image 72 Sinuses/Orbits: Negative. Other: Fluid throughout the right mastoid air cells. IMPRESSION: 1. Two round, peripherally enhancing lesions in the left frontal lobe, concerning for metastatic disease. Numerous additional punctate and linear areas of enhancement are also seen, some of which may represent additional metastatic lesions, but some of which may represent enhancing, subacute infarcts. 2. Numerous foci of restricted diffusion in the bilateral cerebral and cerebellar hemispheres, some of which demonstrate ADC correlates and some of which may have normalized ADC values. These are concerning for acute and subacute infarcts, likely embolic in etiology given multiple vascular territories. 3. Abnormal enhancement in C1, C2, and the right occipital calvarium, concerning for osseous metastatic disease. These results were called by telephone at the time of interpretation on 11/09/2021 at 3:58 am to provider [facility] , who verbally  acknowledged these results. Electronically Signed   By: Merilyn Baba M.D.   On: 11/09/2021 03:59   CT ABDOMEN PELVIS W CONTRAST  Result Date: 11/06/2021 CLINICAL DATA:  Abdominal pain, acute, nonlocalized. Status post fall EXAM: CT ABDOMEN AND PELVIS WITH CONTRAST TECHNIQUE: Multidetector CT imaging of the abdomen and pelvis was performed using the standard protocol following bolus administration of intravenous contrast. RADIATION DOSE REDUCTION: This exam was performed according to the departmental dose-optimization program which includes automated exposure control, adjustment of the mA and/or kV according to patient size and/or use of iterative reconstruction technique. CONTRAST:  134mL OMNIPAQUE IOHEXOL 350 MG/ML SOLN COMPARISON:  None. FINDINGS: Lower chest: Right pleural effusion. Please see separately dictated CT angiography chest 11/06/2021. Ports and Devices: None. Liver: Not enlarged. Several vague hypodense right hepatic lobe lesions measuring 0.9, 1.4, 0.7, 0.5 cm. No laceration or subcapsular hematoma. Biliary System: The gallbladder is otherwise unremarkable with no radio-opaque gallstones. No biliary ductal dilatation. Pancreas: Normal pancreatic contour. No main pancreatic duct dilatation. Spleen: Not enlarged. No focal lesion. No laceration, subcapsular hematoma, or vascular injury. Adrenal Glands: No nodularity bilaterally. Kidneys: Bilateral kidneys enhance symmetrically. No hydronephrosis. Subcentimeter hypodensities are too small characterize. A 1.1 cm vague wedge-shaped hypodensity along the left superior renal pole that likely represents a poorly defined lesion with a density of 45 Hounsfield units. No contusion, laceration, or subcapsular hematoma. No injury to the vascular structures or collecting systems. No hydroureter. The urinary bladder is unremarkable. Bowel: No small or large bowel wall thickening or dilatation. Non-visualization of the appendix. The appendix is not definitely  identified with no inflammatory changes in the right lower quadrant to suggest acute appendicitis. Mesentery, Omentum, and Peritoneum: No simple free fluid ascites. No pneumoperitoneum. No hemoperitoneum. No mesenteric hematoma identified. No organized fluid collection. Pelvic Organs: Normal. Lymph Nodes: No abdominal, pelvic, inguinal lymphadenopathy. Vasculature: No abdominal aorta or iliac aneurysm. No active contrast extravasation or pseudoaneurysm. Musculoskeletal: No significant soft tissue hematoma. No acute pelvic fracture. No spinal fracture. Diffuse appendicular and axial skeleton lytic lesions. IMPRESSION: 1.  No acute traumatic injury to the abdomen, or pelvis. 2. No acute fracture or traumatic malalignment of the lumbar spine. 3. Several vague indeterminate hypodense right hepatic lobe lesions measuring 0.9, 1.4, 0.7, 0.5 cm. Findings could represent metastases. 4. 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 5. An indeterminate 1.1 cm vague  slightly wedge-shaped hypodensity along the left superior renal pole. Finding could represent an underlying poorly defined lesion, small infarction, less likely focal pyelonephritis. 6. Please see separately dictated CT angiography chest 11/06/2021. Electronically Signed   By: Iven Finn M.D.   On: 11/06/2021 15:15   PERIPHERAL VASCULAR CATHETERIZATION  Result Date: 11/08/2021 See surgical note for result.  DG Chest Port 1 View  Result Date: 11/07/2021 CLINICAL DATA:  Status post thorenthesis EXAM: PORTABLE CHEST 1 VIEW COMPARISON:  Chest radiograph 10/22/2021. Chest CT November 06, 2021. FINDINGS: Similar versus mildly increased right midlung masslike opacity. There is some fluid tracking along the right minor fissure. No visible pneumothorax. No consolidation. Similar cardiomediastinal silhouette. IMPRESSION: 1. Similar versus mildly increased right midlung masslike opacity, further characterized on recent chest CT. There is some fluid  tracking along the right minor fissure. 2. No visible pneumothorax status post thoracentesis. Electronically Signed   By: Margaretha Sheffield M.D.   On: 11/07/2021 14:43   ECHOCARDIOGRAM COMPLETE  Result Date: 11/07/2021    ECHOCARDIOGRAM REPORT   Patient Name:   DEANZA UPPERMAN Date of Exam: 11/07/2021 Medical Rec #:  [id number]      Height:       65.5 in Accession #:    [id number]     Weight:       125.7 lb Date of Birth:  1955/12/05      BSA:          1.633 m Patient Age:    68 years       BP:           124/80 mmHg Patient Gender: F              HR:           78 bpm. Exam Location:  [facility] Procedure: 2D Echo, Cardiac Doppler and Color Doppler Indications:     Pulmonary Embolism I26.09  History:         Patient has no prior history of Echocardiogram examinations.                  Pulmonary emboli, acute pulmonary embolism.  Sonographer:     Sherrie Sport Referring Phys:  [id number] Lequita [facility]alt Diagnosing Phys: Donnelly Angelica IMPRESSIONS  1. Left ventricular ejection fraction, by estimation, is 60 to 65%. The left ventricle has normal function. The left ventricle has no regional wall motion abnormalities. Left ventricular diastolic parameters are consistent with Grade I diastolic dysfunction (impaired relaxation).  2. Right ventricular systolic function is normal. The right ventricular size is mildly enlarged. There is mildly elevated pulmonary artery systolic pressure. The estimated right ventricular systolic pressure is 31.4 mmHg.  3. Focal thickening of MV leaflet. Correlate clinically. . The mitral valve is degenerative. Mild mitral valve regurgitation. No evidence of mitral stenosis.  4. The aortic valve is normal in structure. Aortic valve regurgitation is not visualized. No aortic stenosis is present.  5. The inferior vena cava is normal in size with <50% respiratory variability, suggesting right atrial pressure of 8 mmHg. FINDINGS  Left Ventricle: Left ventricular ejection fraction, by estimation, is 60 to 65%. The left  ventricle has normal function. The left ventricle has no regional wall motion abnormalities. The left ventricular internal cavity size was normal in size. There is  no left ventricular hypertrophy. Left ventricular diastolic parameters are consistent with Grade I diastolic dysfunction (impaired relaxation). Right Ventricle: The right ventricular size is mildly enlarged. No increase in right ventricular wall thickness.  Right ventricular systolic function is normal. There is mildly elevated pulmonary artery systolic pressure. The tricuspid regurgitant velocity is 2.81 m/s, and with an assumed right atrial pressure of 8 mmHg, the estimated right ventricular systolic pressure is 84.1 mmHg. Left Atrium: Left atrial size was normal in size. Right Atrium: Right atrial size was normal in size. Pericardium: There is no evidence of pericardial effusion. Mitral Valve: Focal thickening of MV leaflet. Correlate clinically. The mitral valve is degenerative in appearance. Mild mitral valve regurgitation. No evidence of mitral valve stenosis. MV peak gradient, 6.7 mmHg. The mean mitral valve gradient is 3.0 mmHg. Tricuspid Valve: The tricuspid valve is normal in structure. Tricuspid valve regurgitation is mild . No evidence of tricuspid stenosis. Aortic Valve: The aortic valve is normal in structure. Aortic valve regurgitation is not visualized. No aortic stenosis is present. Aortic valve mean gradient measures 3.0 mmHg. Aortic valve peak gradient measures 4.8 mmHg. Aortic valve area, by VTI measures 2.53 cm. Pulmonic Valve: The pulmonic valve was normal in structure. Pulmonic valve regurgitation is not visualized. No evidence of pulmonic stenosis. Aorta: The aortic root is normal in size and structure. Venous: The inferior vena cava is normal in size with less than 50% respiratory variability, suggesting right atrial pressure of 8 mmHg. IAS/Shunts: No atrial level shunt detected by color flow Doppler.  LEFT VENTRICLE PLAX 2D  LVIDd:         3.70 cm   Diastology LVIDs:         2.30 cm   LV e' medial:    5.11 cm/s LV PW:         1.40 cm   LV E/e' medial:  15.9 LV IVS:        1.10 cm   LV e' lateral:   4.35 cm/s LVOT diam:     2.00 cm   LV E/e' lateral: 18.7 LV SV:         52 LV SV Index:   32 LVOT Area:     3.14 cm  RIGHT VENTRICLE RV Basal diam:  2.90 cm RV S prime:     13.10 cm/s TAPSE (M-mode): 2.1 cm LEFT ATRIUM             Index        RIGHT ATRIUM           Index LA diam:        2.50 cm 1.53 cm/m   RA Area:     14.40 cm LA Vol (A2C):   55.7 ml 34.11 ml/m  RA Volume:   34.30 ml  21.01 ml/m LA Vol (A4C):   50.6 ml 30.99 ml/m LA Biplane Vol: 53.7 ml 32.89 ml/m  AORTIC VALVE                    PULMONIC VALVE AV Area (Vmax):    2.69 cm     PV Vmax:        0.70 m/s AV Area (Vmean):   2.50 cm     PV Vmean:       47.133 cm/s AV Area (VTI):     2.53 cm     PV VTI:         0.128 m AV Vmax:           109.00 cm/s  PV Peak grad:   1.9 mmHg AV Vmean:          74.700 cm/s  PV Mean grad:   1.0 mmHg AV  VTI:            0.206 m      RVOT Peak grad: 3 mmHg AV Peak Grad:      4.8 mmHg AV Mean Grad:      3.0 mmHg LVOT Vmax:         93.30 cm/s LVOT Vmean:        59.400 cm/s LVOT VTI:          0.166 m LVOT/AV VTI ratio: 0.81  AORTA Ao Root diam: 3.17 cm MITRAL VALVE                TRICUSPID VALVE MV Area (PHT): 3.17 cm     TR Peak grad:   31.6 mmHg MV Area VTI:   1.83 cm     TR Vmax:        281.00 cm/s MV Peak grad:  6.7 mmHg MV Mean grad:  3.0 mmHg     SHUNTS MV Vmax:       1.29 m/s     Systemic VTI:  0.17 m MV Vmean:      75.9 cm/s    Systemic Diam: 2.00 cm MV Decel Time: 239 msec     Pulmonic VTI:  0.158 m MV E velocity: 81.40 cm/s MV A velocity: 109.00 cm/s MV E/A ratio:  0.75 Donnelly Angelica Electronically signed by Donnelly Angelica Signature Date/Time: 11/07/2021/1:06:16 PM    Final    US THORACENTESIS ASP PLEURAL SPACE W/IMG GUIDE  Result Date: 11/07/2021 INDICATION: 66 year old with right lung mass and concern for metastatic disease. EXAM:  ULTRASOUND GUIDED RIGHT THORACENTESIS MEDICATIONS: None. COMPLICATIONS: None immediate. PROCEDURE: An ultrasound guided thoracentesis was thoroughly discussed with the patient and questions answered. The benefits, risks, alternatives and complications were also discussed. The patient understands and wishes to proceed with the procedure. Written consent was obtained. Ultrasound was performed to localize and mark an adequate pocket of fluid in the right chest. The area was then prepped and draped in the normal sterile fashion. 1% Lidocaine was used for local anesthesia. Under ultrasound guidance a 6 Fr Safe-T-Centesis catheter was introduced. Thoracentesis was performed. The catheter was removed and a dressing applied. FINDINGS: A total of approximately 350 mL of amber colored fluid was removed. Samples were sent to the laboratory as requested by the clinical team. IMPRESSION: Successful ultrasound guided right thoracentesis yielding 350 mL of pleural fluid. Electronically Signed   By: Markus Daft M.D.   On: 11/07/2021 15:01    Labs:  CBC: Recent Labs    11/08/21 0556 11/09/21 0219 11/10/21 0441 11/11/21 0551  WBC 8.2 9.4 10.1 8.2  HGB 10.6* 10.1* 10.5* 9.3*  HCT 33.0* 32.1* 32.9* 29.2*  PLT 163 229 249 235    COAGS: Recent Labs    11/06/21 1257 11/06/21 2301 11/07/21 0412 11/07/21 0955 11/08/21 0556  INR 1.3*  --   --   --   --   APTT 32 60* 67* 68* 67*    BMP: Recent Labs    11/08/21 0556 11/09/21 0219 11/10/21 0441 11/11/21 0551  NA 138 136 135 140  K 4.2 5.0 4.8 4.1  CL 104 102 102 107  CO2 27 28 28 30   GLUCOSE 92 112* 102* 103*  BUN 12 17 14 19   CALCIUM 9.4 9.0 9.3 9.0  CREATININE 0.89 0.95 0.88 0.82  GFRNONAA >60 >60 >60 >60    LIVER FUNCTION TESTS: Recent Labs    11/06/21 1257  BILITOT 0.8  AST 34  ALT  20  ALKPHOS 196*  PROT 8.1  ALBUMIN 4.0    TUMOR MARKERS: No results for input(s): AFPTM, CEA, CA199, CHROMGRNA in the last 8760 hours.  Assessment and  Plan:  66 y/o F who presented to the ED 11/06/21 with chest pain found to have saddle PE as well as right lung mass with likely metastases to multiple sites seen today for liver lesion biopsy to further direct care.  Risks and benefits of liver lesion biopsy was discussed with the patient and/or patient's family including, but not limited to bleeding, infection, damage to adjacent structures or low yield requiring additional tests.  All of the questions were answered and there is agreement to proceed.  Consent signed and in chart.   Thank you for this interesting consult.  I greatly enjoyed meeting Cheryl Schroeder and look forward to participating in their care.  A copy of this report was sent to the requesting provider on this date.  Electronically Signed: Joaquim Nam, PA-C 11/11/2021, 3:17 PM   I spent a total of 20 Minutes in face to face in clinical consultation, greater than 50% of which was counseling/coordinating care for liver lesion biopsy.

## 2021-11-11 NOTE — Progress Notes (Signed)
OT Cancellation Note  Patient Details Name: Cheryl Schroeder MRN: [id number] DOB: 10/31/1955   Cancelled Treatment:    Reason Eval/Treat Not Completed: Patient at procedure or test/ unavailable. Upon attempt, pt out of the room. Per chart, pt having ultrasound. Will re-attempt at later date/time as pt is available and appropriate.   Ardeth Perfect., MPH, MS, OTR/L ascom [phone] 11/11/21, 3:14 PM

## 2021-11-11 NOTE — TOC Progression Note (Signed)
Transition of Care [facility]) - Progression Note    Patient Details  Name: Cheryl Schroeder MRN: [id number] Date of Birth: 12-04-1955  Transition of Care [facility]) CM/SW Contact  Eileen Stanford, LCSW Phone Number: 11/11/2021, 11:31 AM  Clinical Narrative:   No TOC needs, TOC notified Jimsey with Heart Failure that a consult was placed.         Expected Discharge Plan and Services                                                 Social Determinants of Health (SDOH) Interventions    Readmission Risk Interventions No flowsheet data found.

## 2021-11-11 NOTE — Progress Notes (Signed)
Patient clinically stable post Liver biopsy with Dr Denna Haggard, tolerated well. Vitals stable pre and post. Received Versed 1 mg along with Fentanyl 50 mcg IV for procedure. Report at bedside given to [facility] with questions answered. To be on bedrest for 4 hours as well as hold heparin gtt, to restart in am @ 0800 per Dr Denna Haggard request post liver biopsy.

## 2021-11-11 NOTE — Assessment & Plan Note (Addendum)
--   Worrisome for metastatic disease --liver biopsy done

## 2021-11-11 NOTE — Assessment & Plan Note (Signed)
Acute and subacute embolic infarcts 2/2 hypercoag state 2/2 underlying malignancy --stroke workup completed --cont anticogulation

## 2021-11-11 NOTE — Procedures (Addendum)
Interventional Radiology Procedure Note  Date of Procedure: 11/11/2021  Procedure: US guided right liver lesion biopsy   Findings:  1. Successful US guided right liver lesion biopsy  2. Post procedure imagines demonstrate small intraparenchymal hematoma that was stable over short observation period    Complications:  Small intraparenchymal hematoma    Estimated Blood Loss: minimal  Follow-up and Recommendations: 1. Bedrest 4 hours  2. Hold heparin until tomorrow AM CBC, ok to resume heparin if H&H stable    Albin Felling, MD  Vascular & Interventional Radiology  11/11/2021 3:37 PM

## 2021-11-11 NOTE — Progress Notes (Addendum)
Progress Note   Patient: Cheryl Schroeder WER:[id number] DOB: 1956-04-10 DOA: 11/06/2021     5 DOS: the patient was seen and examined on 11/11/2021   Brief hospital course: 66 year old woman PMH recent outpatient diagnosis DVT treated with apixaban, presented with chest pain and frequent falls.  CT revealed acute saddle embolism and multiple PE as well as right middle lung mass and skeletal lesions concerning for malignancy.  Seen by pulmonology with recommendation for malignancy work-up. S/p right-sided thoracentesis. Seen by vascular surgery and underwent left pulmonary mechanical thrombectomy as well as placement of IVC.  Brain MRI concerning for metastatic disease as well as subacute infarcts.  Neurology consulted.  Awaiting cytology from thoracentesis, if negative will need biopsy.  Given need for anticoagulation, obtain biopsy prior to transition to oral anticoagulation.  Once biopsy completed, anticipate discharge home. --2/5 Overall doing well, no PT needed on discharge. Once cytology/biopsy complete, can discharge home.  Assessment and Plan: * Acute pulmonary embolism ([place]) -- Asymptomatic, no hypoxia, hemodynamics stable.  Although troponins were elevated consistent with demand ischemia, 2D echocardiogram reassuring.  Status post thrombectomy -- Presumed secondary to acquired thrombophilia from presumed malignancy Plan: --hold anticoagulation until tomorrow 8 am due to hematoma developed during liver biopsy --will discharge on Xarelto, per oncology rec   Liver hematoma --developed during liver biopsy today --hold anticoagulation until tomorrow 8 am --monitor Hgb  CVA (cerebral vascular accident) ([place]) Acute and subacute embolic infarcts 2/2 hypercoag state 2/2 underlying malignancy --stroke workup completed --cont anticogulation  Brain mass -- Worrisome for malignancy.  Continue work-up for underlying mass.  Renal lesion --left superior renal pole, no signs or symptoms to suggest  infection.  Renal function stable.  Follow-up as an outpatient.  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  Lifelong anticoagulation.  Demand ischemia ([place]) -- Asymptomatic.  Secondary to acute PE, echocardiogram reassuring, EKG nonacute no further evaluation suggested  Liver lesion -- Worrisome for metastatic disease --liver biopsy today  Bone lesion -- Worrisome for metastatic disease, work-up in progress  Mass of right lung -- Concerning for primary lung cancer, imaging concerning for metastatic disease to skeleton, liver, brain.   --Follow-up cytology from pleural effusion.   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 --s/p throacentesis --cytology pending   DVT prophylaxis: Heparin SQ Code Status: Full code  Family Communication: sister updated at bedside today Status is: inpatient Dispo:   The patient is from: home Anticipated d/c is to: home Anticipated d/c date is: likely tomorrow Patient currently is not medically stable to d/c due to: hematoma with liver biopsy, need to monitor overnight   Subjective:  Pt had liver biopsy today.  Had intra-parenchymal hematoma.    Physical Exam: Vitals:   11/11/21 1535 11/11/21 1545 11/11/21 1556 11/11/21 1601  BP: 117/71 116/74 114/72 119/68  Pulse: 84 82 84 85  Resp: 16  20 16   Temp:    99.2 F (37.3 C)  TempSrc:      SpO2: 98% 98% 98% 94%  Weight:      Height:        Constitutional: NAD, AAOx3 HEENT: conjunctivae and lids normal, EOMI CV: No cyanosis.   RESP: normal respiratory effort, on RA Neuro: II - XII grossly intact.  Psych: Normal mood and affect.  Appropriate judgement and  reason   Author: Enzo Bi, MD 11/11/2021 6:18 PM  For on call review [email or web address].

## 2021-11-11 NOTE — Progress Notes (Signed)
Pulmonary Medicine          Date: 11/11/2021,   MRN# [id number] Cheryl Schroeder 05-Apr-1956     AdmissionWeight: 52.2 kg                 CurrentWeight: 56 kg        HISTORY OF PRESENT ILLNESS   Answered her questions. Awaiting the tissue bx results. Breathing stable. Transitioning to po anticoagulation. Oncology to see, out pt pet scan and pulmonary f/u planned.     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   Acute pulmonary embolism ([place]) 11/06/2021   Bipolar 2 disorder (HCC)    Body mass index (BMI) 19.9 or less, adult 11/09/2021   CVA (cerebral vascular accident) ([place]) 11/09/2021   DVT of leg (deep venous thrombosis) ([place]) 11/09/2021   Dysplastic nevus    R calf, txted in past, pt states ~2002   Fibromyalgia    Psoriasis    Thyroid nodule 11/09/2021     SURGICAL HISTORY   Past Surgical History:  Procedure Laterality Date   ABDOMINAL HYSTERECTOMY     ADENOIDECTOMY     BREAST BIOPSY Right 1990   cyst   PULMONARY THROMBECTOMY Left 11/08/2021   Procedure: PULMONARY THROMBECTOMY;  Surgeon: Katha Cabal, MD;  Location: [facility];  Service: Cardiovascular;  Laterality: Left;   TONSILLECTOMY       FAMILY HISTORY   Family History  Problem Relation Age of Onset   Breast cancer Maternal Aunt 72   Lung cancer Father      SOCIAL HISTORY   Social History   Tobacco Use   Smoking status: Never   Smokeless tobacco: Never  Vaping Use   Vaping Use: Never used  Substance Use Topics   Alcohol use: No   Drug use: No     MEDICATIONS    Home Medication:    Current Medication:  Current Facility-Administered Medications:    0.9 %  sodium chloride infusion, 250 mL, Intravenous, PRN, Schnier, Dolores Lory, MD   acetaminophen (TYLENOL) tablet 650 mg, 650 mg, Oral, Q4H PRN, Schnier, Dolores Lory, MD, 650 mg at 11/06/21 2019   ALPRAZolam (XANAX) tablet  0.5-1 mg, 0.5-1 mg, Oral, QHS PRN, Schnier, Dolores Lory, MD, 1 mg at 11/09/21 0147   feeding supplement (ENSURE ENLIVE / ENSURE PLUS) liquid 237 mL, 237 mL, Oral, BID BM, Schnier, Dolores Lory, MD, 237 mL at 11/10/21 1400   fentaNYL (SUBLIMAZE) 100 MCG/2ML injection, , , ,    fentaNYL (SUBLIMAZE) injection, , Intravenous, PRN, El-Abd, Joesph Fillers, MD, 50 mcg at 11/11/21 1515   FLUoxetine (PROZAC) capsule 40 mg, 40 mg, Oral, Daily, Schnier, Dolores Lory, MD, 40 mg at 11/11/21 0846   fluticasone (FLONASE) 50 MCG/ACT nasal spray 1 spray, 1 spray, Each Nare, Daily, Schnier, Dolores Lory, MD   gabapentin (NEURONTIN) capsule 300 mg, 300 mg, Oral, TID, Schnier, Dolores Lory, MD, 300 mg at 11/11/21 1304   heparin ADULT infusion 100 units/mL (25000 units/274mL), 1,000 Units/hr, Intravenous, Continuous, Schnier, Dolores Lory, MD, Stopped at 11/11/21 1200   HYDROcodone-acetaminophen (NORCO/VICODIN) 5-325 MG per tablet 1 tablet, 1 tablet, Oral, Q4H PRN, Schnier, Dolores Lory, MD, 1 tablet at 11/11/21 0456   HYDROmorphone (DILAUDID) injection 0.5 mg, 0.5 mg, Intravenous, Q4H PRN, Schnier, Dolores Lory, MD, 0.5 mg at 11/10/21 0559   lamoTRIgine (LAMICTAL) tablet 200 mg, 200 mg, Oral, Daily, Schnier,  Dolores Lory, MD, 200 mg at 11/11/21 0847   midazolam (VERSED) 2 MG/2ML injection, , , ,    midazolam (VERSED) 5 MG/5ML injection, , Intravenous, PRN, El-Abd, Joesph Fillers, MD, 1 mg at 11/11/21 1515   multivitamin with minerals tablet 1 tablet, 1 tablet, Oral, Daily, Schnier, Dolores Lory, MD, 1 tablet at 11/11/21 0846   ondansetron [facility]) injection 4 mg, 4 mg, Intravenous, Q6H PRN, Schnier, Dolores Lory, MD, 4 mg at 11/07/21 1535   oxyCODONE (Oxy IR/ROXICODONE) immediate release tablet 5 mg, 5 mg, Oral, Q4H PRN, Schnier, Dolores Lory, MD, 5 mg at 11/11/21 0846   pantoprazole (PROTONIX) EC tablet 40 mg, 40 mg, Oral, Daily, Schnier, Dolores Lory, MD, 40 mg at 11/11/21 0846   polyethylene glycol (MIRALAX / GLYCOLAX) packet 17 g, 17 g, Oral, Daily, Schnier,  Dolores Lory, MD, 17 g at 11/10/21 0954   pravastatin (PRAVACHOL) tablet 40 mg, 40 mg, Oral, QHS, Schnier, Dolores Lory, MD, 40 mg at 11/10/21 2121   senna-docusate (Senokot-S) tablet 2 tablet, 2 tablet, Oral, BID, Schnier, Dolores Lory, MD, 2 tablet at 11/10/21 5885   sodium chloride flush (NS) 0.9 % injection 3 mL, 3 mL, Intravenous, Q12H, Schnier, Dolores Lory, MD, 3 mL at 11/11/21 0947   sodium chloride flush (NS) 0.9 % injection 3 mL, 3 mL, Intravenous, PRN, Schnier, Dolores Lory, MD    ALLERGIES   Sulfa antibiotics     REVIEW OF SYSTEMS    Review of Systems:  Gen:  Denies  fever, sweats, chills weigh loss  HEENT: Denies blurred vision, double vision, ear pain, eye pain, hearing loss, nose bleeds, sore throat Cardiac:  No dizziness, chest pain or heaviness, chest tightness,edema Resp:   Denies cough or sputum porduction, shortness of breath,wheezing, hemoptysis,  Gi: Denies swallowing difficulty, stomach pain, nausea or vomiting, diarrhea, constipation, bowel incontinence Gu:  Denies bladder incontinence, burning urine Ext:   Denies Joint pain, stiffness or swelling Skin: Denies  skin rash, easy bruising or bleeding or hives Endoc:  Denies polyuria, polydipsia , polyphagia or weight change Psych:   Denies depression, insomnia or hallucinations   Other:  All other systems negative   VS: BP 117/71    Pulse 84    Temp 98.4 F (36.9 C) (Oral)    Resp 16    Ht 5' 5.51" (1.664 m)    Wt 56 kg    SpO2 98%    BMI 20.22 kg/m      PHYSICAL EXAM    GENERAL:NAD, no fevers, chills, no weakness no fatigue HEAD: Normocephalic, atraumatic.  EYES: Pupils equal, round, reactive to light. Extraocular muscles intact. No scleral icterus.  MOUTH: Moist mucosal membrane. Dentition intact. No abscess noted.  EAR, NOSE, THROAT: Clear without exudates. No external lesions.  NECK: Supple. No thyromegaly. No nodules. No JVD.  PULMONARY: no ruib, decrease bs at base r.. l.  cARDIOVASCULAR: S1 and S2.  Regular rate and rhythm. No murmurs, rubs, or gallops. No edema. Pedal pulses 2+ bilaterally.  GASTROINTESTINAL: Soft, nontender, nondistended. No masses. Positive bowel sounds. No hepatosplenomegaly.  MUSCULOSKELETAL: No swelling, clubbing, or edema. Range of motion full in all extremities.  NEUROLOGIC: Cranial nerves II through XII are intact. No gross focal neurological deficits. Sensation intact. Reflexes intact.  SKIN: No ulceration, lesions, rashes, or cyanosis. Skin warm and dry. Turgor intact.  PSYCHIATRIC: Mood, affect within normal limits. The patient is awake, alert and oriented x 3. Insight, judgment intact.       IMAGING  CT ANGIO HEAD NECK W WO CM  Result Date: 11/09/2021 CLINICAL DATA:  Stroke suspected EXAM: CT ANGIOGRAPHY HEAD AND NECK TECHNIQUE: Multidetector CT imaging of the head and neck was performed using the standard protocol during bolus administration of intravenous contrast. Multiplanar CT image reconstructions and MIPs were obtained to evaluate the vascular anatomy. Carotid stenosis measurements (when applicable) are obtained utilizing NASCET criteria, using the distal internal carotid diameter as the denominator. RADIATION DOSE REDUCTION: This exam was performed according to the departmental dose-optimization program which includes automated exposure control, adjustment of the mA and/or kV according to patient size and/or use of iterative reconstruction technique. CONTRAST:  24mL OMNIPAQUE IOHEXOL 350 MG/ML SOLN COMPARISON:  No prior CT or CTA clinical correlation is made with MRI 11/09/2021; FINDINGS: CT HEAD FINDINGS Brain: No evidence of acute cortical infarction, hemorrhage, cerebral edema, mass, mass effect, or midline shift. No hydrocephalus or extra-axial fluid collection. Vascular: No hyperdense vessel. Skull: Normal. Negative for fracture or focal lesion. Sinuses/Orbits: No acute finding. Other: Fluid in the right mastoid air cells. CTA NECK FINDINGS Aortic arch:  Standard branching. Imaged portion shows no evidence of aneurysm or dissection. No significant stenosis of the major arch vessel origins. Right carotid system: No evidence of dissection, stenosis (50% or greater) or occlusion. Left carotid system: No evidence of dissection, stenosis (50% or greater) or occlusion. Vertebral arteries: The left vertebral artery is patent from its origin to the skull base. The right vertebral artery is diminutive, likely congenitally given the size of the vertebral artery foramen, and is patent to the skull base. No evidence of dissection, occlusion, or hemodynamically significant stenosis (greater than 50%). Skeleton: Numerous lytic lesions in the cervical and imaged thoracic spine. Possible mild vertebral body height loss at C6. Other neck: Hypoenhancing lesion in the left thyroid lobe measures up to 2.2 cm. Upper chest: Redemonstrated saddle embolus in the left main pulmonary artery, extending into the branch vessels, which was better evaluated on the 11/06/2021 CTA. Slight decrease in the size of a right pleural effusion, with new trace left pleural effusion. New ground-glass and patchy opacities in the right upper lobe (series 9, images 234, 237, 275, and 305) and superior right lower lobe (series 9, image 310), with redemonstrated opacities in the left upper lobe (series 9, image 232) and medial right upper lobe (series 9, image 295). Partially imaged inferior right upper lobe mass. Review of the MIP images confirms the above findings CTA HEAD FINDINGS Anterior circulation: Both internal carotid arteries are patent to the termini, without significant stenosis. A1 segments patent. Normal anterior communicating artery. Anterior cerebral arteries are patent to their distal aspects. No M1 stenosis or occlusion. Normal MCA bifurcations. Distal MCA branches perfused and symmetric. Posterior circulation: The right vertebral artery is quite diminutive in the proximal V4 segment and  further decreased in caliber after the takeoff of the PICA (series 9, image 129); while this may represent a degree of stenosis, it could also be due to normal branching of an already diminutive vessel. The dominant left vertebral artery is patent to the vertebrobasilar junction. Posterior inferior cerebral arteries patent bilaterally. Basilar patent to its distal aspect. Superior cerebellar arteries patent bilaterally. Patent right posterior communicating artery, with a hypoplastic right P1 and near fetal origin of the right PCA. The left P1 is patent, with a small patent left posterior communicating artery. PCAs perfused to their distal aspects without stenosis. Venous sinuses: As permitted by contrast timing, patent. Anatomic variants: Near fetal origin of the  right PCA. Review of the MIP images confirms the above findings IMPRESSION: 1. Diminutive extracranial right vertebral artery, likely congenital, with further diminution of the intracranial right V4 after the takeoff of the right PICA, which may reflect a degree of stenosis and/or normal post-branching caliber of an already diminutive vessel. The left vertebral artery is dominant and patent. No intracranial large vessel occlusion or other hemodynamically significant stenosis. 2.  No hemodynamically significant stenosis in the neck. 3. Partially imaged right mid lung mass, with increased ground-glass opacities in the imaged right upper and lower lobes, concerning for worsening multifocal infection. 4. Slightly decreased right pleural effusion, with new trace left pleural effusion. 5. Lytic lesions in the cervical spine, with possible mild vertebral body height loss in C6. 6. Hypoenhancing lesion in the left thyroid lobe, which measures up to 2.2 cm. If this has not previously been evaluated, a non-emergent ultrasound of the thyroid is recommended. (Reference: J Am Coll Radiol. 2015 Feb;12(2): 143-50) 7. Electronically Signed   By: Merilyn Baba M.D.   On:  11/09/2021 21:09   DG Chest 2 View  Result Date: 10/22/2021 CLINICAL DATA:  Contusion of the rib on the left side. Left rib pain. EXAM: CHEST - 2 VIEW COMPARISON:  None. FINDINGS: The heart size and mediastinal contours are within normal limits. Soft tissue density mass with area of atelectasis in the right mid lung measuring approximately 3.0 x 4.9 cm. Bibasilar atelectasis. Hyperinflated lungs. No pleural effusion or pneumothorax. The visualized skeletal structures are unremarkable. IMPRESSION: 1. Soft tissue density mass in the right mid lung measuring at least 3.0 x 4.9 cm, it may represent pneumonia, loculated pleural effusion or pulmonary mass. Further evaluation with CT examination would be helpful. Follow-up to resolution is recommended. 2. Hyperinflated lungs concerning for COPD. Bibasilar atelectasis. No pleural effusion or pneumothorax. An attempt was made to reach out to the provider Dr. Clemmie Krill without response from our office at the time of dictation. Electronically Signed   By: Keane Police D.O.   On: 10/22/2021 16:07   DG Ribs Unilateral W/Chest Left  Result Date: 11/06/2021 CLINICAL DATA:  Fall, left rib pain EXAM: LEFT RIBS AND CHEST - 3+ VIEW COMPARISON:  Chest x-ray 10/22/2021 FINDINGS: No acute fracture identified in the left ribs. Cardiomediastinal silhouette is unchanged. Persistent ovoid masslike opacity adjacent to the fissure in the right lung measuring approximately 5 x 3.2 cm, grossly unchanged. Linear opacities in the left lower lung zone likely represent subsegmental atelectasis. Hyperinflated lungs with mildly prominent interstitial lung markings. No pleural effusion or pneumothorax visualized. IMPRESSION: 1. No left rib fractures identified. 2. Persistent and unchanged ovoid perifissural masslike opacity in the right lung. 3. COPD. Electronically Signed   By: Ofilia Neas M.D.   On: 11/06/2021 12:16   CT Angio Chest PE W and/or Wo Contrast  Result Date:  11/06/2021 CLINICAL DATA:  Chest pain, recent trauma EXAM: CT ANGIOGRAPHY CHEST WITH CONTRAST TECHNIQUE: Multidetector CT imaging of the chest was performed using the standard protocol during bolus administration of intravenous contrast. Multiplanar CT image reconstructions and MIPs were obtained to evaluate the vascular anatomy. RADIATION DOSE REDUCTION: This exam was performed according to the departmental dose-optimization program which includes automated exposure control, adjustment of the mA and/or kV according to patient size and/or use of iterative reconstruction technique. CONTRAST:  121mL OMNIPAQUE IOHEXOL 350 MG/ML SOLN COMPARISON:  Chest radiographs done earlier today FINDINGS: Cardiovascular: There is homogeneous enhancement in the thoracic aorta. There is mild ectasia of main  pulmonary artery measuring 3.2 cm. There is saddle embolus in the left main pulmonary artery extending into the left upper lobe and left lower lobe branches. There are few small filling defects in the subsegmental branches in the right middle lobe and right lower lobe. There are no imaging signs of right ventricular strain. Mediastinum/Nodes: No significant lymphadenopathy seen. There is 2.5 cm low-density lesion in the enlarged left lobe of thyroid. Lungs/Pleura: There is moderate right pleural effusion. There are small patchy infiltrates in the medial aspect of right upper lobe in the right upper lung fields. There is 5.2 x 4.1 cm homogeneous density inseparable from interlobar fissure in the anterior right parahilar region. There is narrowing of the lumen of the bronchus leading to this homogeneous opacity in the anteromedial right mid lung fields. Linear patchy infiltrates are seen in both lower lung fields, more so on the right side. In image 13 of series 6, there is 2 cm ground-glass density in the anterior left apex. There is moderate right pleural effusion. There is no significant left pleural effusion. There is no  pneumothorax. Upper Abdomen: Fatty liver. Musculoskeletal: There are numerous lytic lesions of varying sizes in the left ribs, thoracic spine and lumbar spine. There is decrease in height of bodies of C6 vertebra. There is also decrease in height of bodies of T6, T7, T8 and T9 vertebrae. Review of the MIP images confirms the above findings. IMPRESSION: There is evidence of acute pulmonary embolism with small to moderate thrombus burden. There are no imaging signs of acute right ventricular strain. There is no evidence of thoracic aortic dissection. There is 4.8 cm homogeneous opacity in the anterior right mid lung fields. This lesion has to be considered primary malignant neoplasm until proven otherwise. There is narrowing of bronchus leading to this parahilar density. PET-CT and biopsy as warranted should be considered. There are numerous lytic lesions of varying sizes along with compression fractures in the cervical and thoracic spine as described in the body of the report consistent with extensive skeletal metastatic disease. Moderate right pleural effusion. There are ground-glass and patchy alveolar infiltrates in both lungs as described in the body of the report suggesting multifocal pneumonia. There is 2.5 cm low-density nodule in the left lobe of thyroid. When the patient's clinical condition permits, thyroid sonogram may be considered. Electronically Signed   By: Elmer Picker M.D.   On: 11/06/2021 15:21   MR BRAIN W WO CONTRAST  Result Date: 11/09/2021 CLINICAL DATA:  Weight loss, brain metastases suspected EXAM: MRI HEAD WITHOUT AND WITH CONTRAST TECHNIQUE: Multiplanar, multiecho pulse sequences of the brain and surrounding structures were obtained without and with intravenous contrast. CONTRAST:  65mL GADAVIST GADOBUTROL 1 MMOL/ML IV SOLN COMPARISON:  None. FINDINGS: Brain: Round, peripherally enhancing lesion in the left inferior frontal lobe, which measures up to 9 x 9 mm (series 18, image 17),  associated hemosiderin deposition, concerning for a hemorrhagic metastasis. This area is associated with hemosiderin deposition. Peripherally enhancing lesion in the left frontal lobe measures up to 4 mm (series 18, image 127). Multiple additional enhancing foci are also seen, some of which correlate with areas of restricted diffusion, described below, while some appear more masslike. These enhancing foci are seen on series 18 on the following images: 136, 120, 117, 115 113, 111, 108, 106, 104, 95, 94, 93, 92, 90, 87, 84, 80, 78, 77, 76, 70, and 59. Scattered foci of increased signal on diffusion-weighted imaging throughout the bilateral cerebral and cerebellar hemispheres, some  of which demonstrate ADC correlates; for example several cortical foci and white matter areas in the posterior right frontal lobe (series 5, image 32-34), in the left anterior frontal lobe (series 5, image 27-28), areas along the right insula (series 5, image 27), left caudate (series 5, image 28), and bilateral cerebellar hemispheres (series 5, image 12-14). Some foci of increased signal on diffusion-weighted imaging do not demonstrate ADC correlates, and may reflect subacute infarcts with normalization of the ADC. However, some of these correlate with foci of enhancement such as on series 18, image 104. No acute hemorrhage or midline shift. No hydrocephalus or extra-axial collection Vascular: Normal flow voids. Skull and upper cervical spine: Abnormal enhancement in the arch (series 18, image 15) and bilateral lateral masses of C1 (series 18, image 8 and 10), as well as the anterior aspect of C2 (series 18, image 1). Small enhancing focus in the right occipital calvarium (series 18, image 72 Sinuses/Orbits: Negative. Other: Fluid throughout the right mastoid air cells. IMPRESSION: 1. Two round, peripherally enhancing lesions in the left frontal lobe, concerning for metastatic disease. Numerous additional punctate and linear areas of  enhancement are also seen, some of which may represent additional metastatic lesions, but some of which may represent enhancing, subacute infarcts. 2. Numerous foci of restricted diffusion in the bilateral cerebral and cerebellar hemispheres, some of which demonstrate ADC correlates and some of which may have normalized ADC values. These are concerning for acute and subacute infarcts, likely embolic in etiology given multiple vascular territories. 3. Abnormal enhancement in C1, C2, and the right occipital calvarium, concerning for osseous metastatic disease. These results were called by telephone at the time of interpretation on 11/09/2021 at 3:58 am to provider [facility] , who verbally acknowledged these results. Electronically Signed   By: Merilyn Baba M.D.   On: 11/09/2021 03:59   CT ABDOMEN PELVIS W CONTRAST  Result Date: 11/06/2021 CLINICAL DATA:  Abdominal pain, acute, nonlocalized. Status post fall EXAM: CT ABDOMEN AND PELVIS WITH CONTRAST TECHNIQUE: Multidetector CT imaging of the abdomen and pelvis was performed using the standard protocol following bolus administration of intravenous contrast. RADIATION DOSE REDUCTION: This exam was performed according to the departmental dose-optimization program which includes automated exposure control, adjustment of the mA and/or kV according to patient size and/or use of iterative reconstruction technique. CONTRAST:  133mL OMNIPAQUE IOHEXOL 350 MG/ML SOLN COMPARISON:  None. FINDINGS: Lower chest: Right pleural effusion. Please see separately dictated CT angiography chest 11/06/2021. Ports and Devices: None. Liver: Not enlarged. Several vague hypodense right hepatic lobe lesions measuring 0.9, 1.4, 0.7, 0.5 cm. No laceration or subcapsular hematoma. Biliary System: The gallbladder is otherwise unremarkable with no radio-opaque gallstones. No biliary ductal dilatation. Pancreas: Normal pancreatic contour. No main pancreatic duct dilatation. Spleen: Not enlarged. No  focal lesion. No laceration, subcapsular hematoma, or vascular injury. Adrenal Glands: No nodularity bilaterally. Kidneys: Bilateral kidneys enhance symmetrically. No hydronephrosis. Subcentimeter hypodensities are too small characterize. A 1.1 cm vague wedge-shaped hypodensity along the left superior renal pole that likely represents a poorly defined lesion with a density of 45 Hounsfield units. No contusion, laceration, or subcapsular hematoma. No injury to the vascular structures or collecting systems. No hydroureter. The urinary bladder is unremarkable. Bowel: No small or large bowel wall thickening or dilatation. Non-visualization of the appendix. The appendix is not definitely identified with no inflammatory changes in the right lower quadrant to suggest acute appendicitis. Mesentery, Omentum, and Peritoneum: No simple free fluid ascites. No pneumoperitoneum. No hemoperitoneum. No  mesenteric hematoma identified. No organized fluid collection. Pelvic Organs: Normal. Lymph Nodes: No abdominal, pelvic, inguinal lymphadenopathy. Vasculature: No abdominal aorta or iliac aneurysm. No active contrast extravasation or pseudoaneurysm. Musculoskeletal: No significant soft tissue hematoma. No acute pelvic fracture. No spinal fracture. Diffuse appendicular and axial skeleton lytic lesions. IMPRESSION: 1.  No acute traumatic injury to the abdomen, or pelvis. 2. No acute fracture or traumatic malalignment of the lumbar spine. 3. Several vague indeterminate hypodense right hepatic lobe lesions measuring 0.9, 1.4, 0.7, 0.5 cm. Findings could represent metastases. 4. 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 5. An indeterminate 1.1 cm vague slightly wedge-shaped hypodensity along the left superior renal pole. Finding could represent an underlying poorly defined lesion, small infarction, less likely focal pyelonephritis. 6. Please see separately dictated CT angiography chest 11/06/2021. Electronically Signed    By: Iven Finn M.D.   On: 11/06/2021 15:15   PERIPHERAL VASCULAR CATHETERIZATION  Result Date: 11/08/2021 See surgical note for result.  DG Chest Port 1 View  Result Date: 11/07/2021 CLINICAL DATA:  Status post thorenthesis EXAM: PORTABLE CHEST 1 VIEW COMPARISON:  Chest radiograph 10/22/2021. Chest CT November 06, 2021. FINDINGS: Similar versus mildly increased right midlung masslike opacity. There is some fluid tracking along the right minor fissure. No visible pneumothorax. No consolidation. Similar cardiomediastinal silhouette. IMPRESSION: 1. Similar versus mildly increased right midlung masslike opacity, further characterized on recent chest CT. There is some fluid tracking along the right minor fissure. 2. No visible pneumothorax status post thoracentesis. Electronically Signed   By: Margaretha Sheffield M.D.   On: 11/07/2021 14:43   ECHOCARDIOGRAM COMPLETE  Result Date: 11/07/2021    ECHOCARDIOGRAM REPORT   Patient Name:   Cheryl Schroeder Date of Exam: 11/07/2021 Medical Rec #:  [id number]      Height:       65.5 in Accession #:    [id number]     Weight:       125.7 lb Date of Birth:  Jul 02, 1956      BSA:          1.633 m Patient Age:    66 years       BP:           124/80 mmHg Patient Gender: F              HR:           78 bpm. Exam Location:  [facility] Procedure: 2D Echo, Cardiac Doppler and Color Doppler Indications:     Pulmonary Embolism I26.09  History:         Patient has no prior history of Echocardiogram examinations.                  Pulmonary emboli, acute pulmonary embolism.  Sonographer:     Sherrie Sport Referring Phys:  [id number] Lequita Halt Diagnosing Phys: Donnelly Angelica IMPRESSIONS  1. Left ventricular ejection fraction, by estimation, is 60 to 65%. The left ventricle has normal function. The left ventricle has no regional wall motion abnormalities. Left ventricular diastolic parameters are consistent with Grade I diastolic dysfunction (impaired relaxation).  2. Right ventricular systolic function  is normal. The right ventricular size is mildly enlarged. There is mildly elevated pulmonary artery systolic pressure. The estimated right ventricular systolic pressure is 26.9 mmHg.  3. Focal thickening of MV leaflet. Correlate clinically. . The mitral valve is degenerative. Mild mitral valve regurgitation. No evidence of mitral stenosis.  4. The aortic valve is normal in structure. Aortic  valve regurgitation is not visualized. No aortic stenosis is present.  5. The inferior vena cava is normal in size with <50% respiratory variability, suggesting right atrial pressure of 8 mmHg. FINDINGS  Left Ventricle: Left ventricular ejection fraction, by estimation, is 60 to 65%. The left ventricle has normal function. The left ventricle has no regional wall motion abnormalities. The left ventricular internal cavity size was normal in size. There is  no left ventricular hypertrophy. Left ventricular diastolic parameters are consistent with Grade I diastolic dysfunction (impaired relaxation). Right Ventricle: The right ventricular size is mildly enlarged. No increase in right ventricular wall thickness. Right ventricular systolic function is normal. There is mildly elevated pulmonary artery systolic pressure. The tricuspid regurgitant velocity is 2.81 m/s, and with an assumed right atrial pressure of 8 mmHg, the estimated right ventricular systolic pressure is 18.2 mmHg. Left Atrium: Left atrial size was normal in size. Right Atrium: Right atrial size was normal in size. Pericardium: There is no evidence of pericardial effusion. Mitral Valve: Focal thickening of MV leaflet. Correlate clinically. The mitral valve is degenerative in appearance. Mild mitral valve regurgitation. No evidence of mitral valve stenosis. MV peak gradient, 6.7 mmHg. The mean mitral valve gradient is 3.0 mmHg. Tricuspid Valve: The tricuspid valve is normal in structure. Tricuspid valve regurgitation is mild . No evidence of tricuspid stenosis. Aortic  Valve: The aortic valve is normal in structure. Aortic valve regurgitation is not visualized. No aortic stenosis is present. Aortic valve mean gradient measures 3.0 mmHg. Aortic valve peak gradient measures 4.8 mmHg. Aortic valve area, by VTI measures 2.53 cm. Pulmonic Valve: The pulmonic valve was normal in structure. Pulmonic valve regurgitation is not visualized. No evidence of pulmonic stenosis. Aorta: The aortic root is normal in size and structure. Venous: The inferior vena cava is normal in size with less than 50% respiratory variability, suggesting right atrial pressure of 8 mmHg. IAS/Shunts: No atrial level shunt detected by color flow Doppler.  LEFT VENTRICLE PLAX 2D LVIDd:         3.70 cm   Diastology LVIDs:         2.30 cm   LV e' medial:    5.11 cm/s LV PW:         1.40 cm   LV E/e' medial:  15.9 LV IVS:        1.10 cm   LV e' lateral:   4.35 cm/s LVOT diam:     2.00 cm   LV E/e' lateral: 18.7 LV SV:         52 LV SV Index:   32 LVOT Area:     3.14 cm  RIGHT VENTRICLE RV Basal diam:  2.90 cm RV S prime:     13.10 cm/s TAPSE (M-mode): 2.1 cm LEFT ATRIUM             Index        RIGHT ATRIUM           Index LA diam:        2.50 cm 1.53 cm/m   RA Area:     14.40 cm LA Vol (A2C):   55.7 ml 34.11 ml/m  RA Volume:   34.30 ml  21.01 ml/m LA Vol (A4C):   50.6 ml 30.99 ml/m LA Biplane Vol: 53.7 ml 32.89 ml/m  AORTIC VALVE                    PULMONIC VALVE AV Area (Vmax):    2.69  cm     PV Vmax:        0.70 m/s AV Area (Vmean):   2.50 cm     PV Vmean:       47.133 cm/s AV Area (VTI):     2.53 cm     PV VTI:         0.128 m AV Vmax:           109.00 cm/s  PV Peak grad:   1.9 mmHg AV Vmean:          74.700 cm/s  PV Mean grad:   1.0 mmHg AV VTI:            0.206 m      RVOT Peak grad: 3 mmHg AV Peak Grad:      4.8 mmHg AV Mean Grad:      3.0 mmHg LVOT Vmax:         93.30 cm/s LVOT Vmean:        59.400 cm/s LVOT VTI:          0.166 m LVOT/AV VTI ratio: 0.81  AORTA Ao Root diam: 3.17 cm MITRAL VALVE                 TRICUSPID VALVE MV Area (PHT): 3.17 cm     TR Peak grad:   31.6 mmHg MV Area VTI:   1.83 cm     TR Vmax:        281.00 cm/s MV Peak grad:  6.7 mmHg MV Mean grad:  3.0 mmHg     SHUNTS MV Vmax:       1.29 m/s     Systemic VTI:  0.17 m MV Vmean:      75.9 cm/s    Systemic Diam: 2.00 cm MV Decel Time: 239 msec     Pulmonic VTI:  0.158 m MV E velocity: 81.40 cm/s MV A velocity: 109.00 cm/s MV E/A ratio:  0.75 Donnelly Angelica Electronically signed by Donnelly Angelica Signature Date/Time: 11/07/2021/1:06:16 PM    Final    US THORACENTESIS ASP PLEURAL SPACE W/IMG GUIDE  Result Date: 11/07/2021 INDICATION: 66 year old with right lung mass and concern for metastatic disease. EXAM: ULTRASOUND GUIDED RIGHT THORACENTESIS MEDICATIONS: None. COMPLICATIONS: None immediate. PROCEDURE: An ultrasound guided thoracentesis was thoroughly discussed with the patient and questions answered. The benefits, risks, alternatives and complications were also discussed. The patient understands and wishes to proceed with the procedure. Written consent was obtained. Ultrasound was performed to localize and mark an adequate pocket of fluid in the right chest. The area was then prepped and draped in the normal sterile fashion. 1% Lidocaine was used for local anesthesia. Under ultrasound guidance a 6 Fr Safe-T-Centesis catheter was introduced. Thoracentesis was performed. The catheter was removed and a dressing applied. FINDINGS: A total of approximately 350 mL of amber colored fluid was removed. Samples were sent to the laboratory as requested by the clinical team. IMPRESSION: Successful ultrasound guided right thoracentesis yielding 350 mL of pleural fluid. Electronically Signed   By: Markus Daft M.D.   On: 11/07/2021 15:01      ASSESSMENT/PLAN   This is a pleasant, unfortunate lady of faith who presents with known dvt, sob, lower chest pain was placed on eliqius, 23 weeks ago. She  came in yesterday with dizziness and falling. 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    S/p mechanical thrombolectomy, ivc filter placement.  To be placed on xeralto      Malignacy w/u -hepatic lesion biospy today -pet scan, out patient -pulm wise can go home tomorrow  -out pt f/u in pulmonary in one week thanks         Thank you for allowing me to participate in the care of this patient.   Patient/Family are satisfied with care plan and all questions have been answered.  This document was prepared using Dragon voice recognition software and may include unintentional dictation errors.     Wallene Huh, M.D.  Division of [place]

## 2021-11-11 NOTE — Progress Notes (Signed)
Hematology/Oncology Progress note [phone] [phone]     Patient Care Team: Lynnell Jude, MD as PCP - General (Family Medicine)   Name of the patient: Cheryl Schroeder  [id number]  Aug 23, 1956  Date of visit: 11/11/21   INTERVAL HISTORY-   11/09/2021, MRI brain with and without contrast showed 2 brain lesions suspicious for metastatic disease.  Numerous foci of lesions suspecting acute and subacute infarcts.  Osseous metastasis of C1 and C2.  And right occipital calvarium  11/11/2021, status post ultrasound guided liver biopsy.  Patient had a liver hematoma.  Anticoagulation is temporarily held until tomorrow. Patient was seen by neurology for stroke and completed a stroke work-up including echocardiogram, CT angiogram of the neck Sister Eustaquio Maize is at the bedside.   Allergies  Allergen Reactions   Sulfa Antibiotics Nausea Only and Nausea And Vomiting    Patient Active Problem List   Diagnosis Date Noted   Liver hematoma 11/11/2021   Demand ischemia ([place]) 11/09/2021   Acquired thrombophilia ([place]) 11/09/2021   DVT of leg (deep venous thrombosis) ([place]) 11/09/2021   Thyroid nodule 11/09/2021   Renal lesion 11/09/2021   Brain mass 11/09/2021   CVA (cerebral vascular accident) ([place]) 11/09/2021   Body mass index (BMI) 19.9 or less, adult 11/09/2021   Malignant pleural effusion    Mass of right lung    Bone lesion    Liver lesion    Acute pulmonary embolism ([place]) 11/06/2021   Pulmonary emboli ([place]) 11/06/2021   Plantar fasciitis 07/09/2014     Past Medical History:  Diagnosis Date   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   Acute pulmonary embolism ([place]) 11/06/2021   Bipolar 2 disorder ([facility])    Body mass index (BMI) 19.9 or less, adult 11/09/2021   CVA (cerebral vascular accident) ([place]) 11/09/2021   DVT of leg (deep venous thrombosis) ([place]) 11/09/2021   Dysplastic nevus    R calf, txted in past, pt states ~2002    Fibromyalgia    Psoriasis    Thyroid nodule 11/09/2021     Past Surgical History:  Procedure Laterality Date   ABDOMINAL HYSTERECTOMY     ADENOIDECTOMY     BREAST BIOPSY Right 1990   cyst   PULMONARY THROMBECTOMY Left 11/08/2021   Procedure: PULMONARY THROMBECTOMY;  Surgeon: Katha Cabal, MD;  Location: [facility];  Service: Cardiovascular;  Laterality: Left;   TONSILLECTOMY      Social History   Socioeconomic History   Marital status: Single    Spouse name: Not on file   Number of children: Not on file   Years of education: Not on file   Highest education level: Not on file  Occupational History   Not on file  Tobacco Use   Smoking status: Never   Smokeless tobacco: Never  Vaping Use   Vaping Use: Never used  Substance and Sexual Activity   Alcohol use: No   Drug use: No   Sexual activity: Not on file  Other Topics Concern   Not on file  Social History Narrative   Not on file   Social Determinants of Health   Financial Resource Strain: Not on file  Food Insecurity: Not on file  Transportation Needs: Not on file  Physical Activity: Not on file  Stress: Not on file  Social Connections: Not on file  Intimate Partner Violence: Not on file     Family History  Problem Relation Age of Onset   Breast  cancer Maternal Aunt 72   Lung cancer Father      Current Facility-Administered Medications:    0.9 %  sodium chloride infusion, 250 mL, Intravenous, PRN, Schnier, Dolores Lory, MD   acetaminophen (TYLENOL) tablet 650 mg, 650 mg, Oral, Q4H PRN, Schnier, Dolores Lory, MD, 650 mg at 11/06/21 2019   ALPRAZolam Duanne Moron) tablet 0.5-1 mg, 0.5-1 mg, Oral, QHS PRN, Schnier, Dolores Lory, MD, 1 mg at 11/09/21 0147   feeding supplement (ENSURE ENLIVE / ENSURE PLUS) liquid 237 mL, 237 mL, Oral, BID BM, Schnier, Dolores Lory, MD, 237 mL at 11/10/21 1400   fentaNYL (SUBLIMAZE) 100 MCG/2ML injection, , , ,    fentaNYL (SUBLIMAZE) injection, , Intravenous, PRN, El-Abd, Joesph Fillers, MD, 50 mcg at 11/11/21 1515   FLUoxetine (PROZAC) capsule 40 mg, 40 mg, Oral, Daily, Schnier, Dolores Lory, MD, 40 mg at 11/11/21 0846   fluticasone (FLONASE) 50 MCG/ACT nasal spray 1 spray, 1 spray, Each Nare, Daily, Schnier, Dolores Lory, MD   gabapentin (NEURONTIN) capsule 300 mg, 300 mg, Oral, TID, Schnier, Dolores Lory, MD, 300 mg at 11/11/21 1304   heparin ADULT infusion 100 units/mL (25000 units/246mL), 1,000 Units/hr, Intravenous, Continuous, Schnier, Dolores Lory, MD, Stopped at 11/11/21 1200   HYDROcodone-acetaminophen (NORCO/VICODIN) 5-325 MG per tablet 1 tablet, 1 tablet, Oral, Q4H PRN, Schnier, Dolores Lory, MD, 1 tablet at 11/11/21 0456   HYDROmorphone (DILAUDID) injection 0.5 mg, 0.5 mg, Intravenous, Q4H PRN, Schnier, Dolores Lory, MD, 0.5 mg at 11/11/21 1653   lamoTRIgine (LAMICTAL) tablet 200 mg, 200 mg, Oral, Daily, Schnier, Dolores Lory, MD, 200 mg at 11/11/21 0847   midazolam (VERSED) 2 MG/2ML injection, , , ,    midazolam (VERSED) 5 MG/5ML injection, , Intravenous, PRN, El-Abd, Joesph Fillers, MD, 1 mg at 11/11/21 1515   multivitamin with minerals tablet 1 tablet, 1 tablet, Oral, Daily, Schnier, Dolores Lory, MD, 1 tablet at 11/11/21 0846   ondansetron (ZOFRAN) injection 4 mg, 4 mg, Intravenous, Q6H PRN, Schnier, Dolores Lory, MD, 4 mg at 11/07/21 1535   oxyCODONE (Oxy IR/ROXICODONE) immediate release tablet 5 mg, 5 mg, Oral, Q4H PRN, Schnier, Dolores Lory, MD, 5 mg at 11/11/21 0846   pantoprazole (PROTONIX) EC tablet 40 mg, 40 mg, Oral, Daily, Schnier, Dolores Lory, MD, 40 mg at 11/11/21 0846   polyethylene glycol (MIRALAX / GLYCOLAX) packet 17 g, 17 g, Oral, Daily, Schnier, Dolores Lory, MD, 17 g at 11/10/21 0954   pravastatin (PRAVACHOL) tablet 40 mg, 40 mg, Oral, QHS, Schnier, Dolores Lory, MD, 40 mg at 11/10/21 2121   senna-docusate (Senokot-S) tablet 2 tablet, 2 tablet, Oral, BID, Schnier, Dolores Lory, MD, 2 tablet at 11/10/21 0954   sodium chloride flush (NS) 0.9 % injection 3 mL, 3 mL, Intravenous, Q12H, Schnier,  Dolores Lory, MD, 3 mL at 11/11/21 0947   sodium chloride flush (NS) 0.9 % injection 3 mL, 3 mL, Intravenous, PRN, Katha Cabal, MD   Physical exam:  Vitals:   11/11/21 1535 11/11/21 1545 11/11/21 1556 11/11/21 1601  BP: 117/71 116/74 114/72 119/68  Pulse: 84 82 84 85  Resp: 16  20 16   Temp:    99.2 F (37.3 C)  TempSrc:      SpO2: 98% 98% 98% 94%  Weight:      Height:       Physical Exam Constitutional:      General: She is not in acute distress.    Appearance: She is not diaphoretic.  HENT:     Head: Normocephalic  and atraumatic.     Nose: Nose normal.     Mouth/Throat:     Pharynx: No oropharyngeal exudate.  Eyes:     General: No scleral icterus.    Pupils: Pupils are equal, round, and reactive to light.  Cardiovascular:     Rate and Rhythm: Normal rate and regular rhythm.     Heart sounds: No murmur heard. Pulmonary:     Effort: Pulmonary effort is normal. No respiratory distress.     Breath sounds: No rales.  Chest:     Chest wall: No tenderness.  Abdominal:     General: There is no distension.     Palpations: Abdomen is soft.     Tenderness: There is no abdominal tenderness.  Musculoskeletal:        General: Normal range of motion.     Cervical back: Normal range of motion and neck supple.  Skin:    General: Skin is warm and dry.     Findings: No erythema.  Neurological:     Mental Status: She is alert and oriented to person, place, and time.     Cranial Nerves: No cranial nerve deficit.     Motor: No abnormal muscle tone.     Coordination: Coordination normal.  Psychiatric:        Mood and Affect: Affect normal.       CMP Latest Ref Rng & Units 11/11/2021  Glucose 70 - 99 mg/dL 103(H)  BUN 8 - 23 mg/dL 19  Creatinine 0.44 - 1.00 mg/dL 0.82  Sodium 135 - 145 mmol/L 140  Potassium 3.5 - 5.1 mmol/L 4.1  Chloride 98 - 111 mmol/L 107  CO2 22 - 32 mmol/L 30  Calcium 8.9 - 10.3 mg/dL 9.0  Total Protein 6.5 - 8.1 g/dL -  Total Bilirubin 0.3 - 1.2  mg/dL -  Alkaline Phos 38 - 126 U/L -  AST 15 - 41 U/L -  ALT 0 - 44 U/L -   CBC Latest Ref Rng & Units 11/11/2021  WBC 4.0 - 10.5 K/uL 8.2  Hemoglobin 12.0 - 15.0 g/dL 9.3(L)  Hematocrit 36.0 - 46.0 % 29.2(L)  Platelets 150 - 400 K/uL 235    RADIOGRAPHIC STUDIES: I have personally reviewed the radiological images as listed and agreed with the findings in the report. CT ANGIO HEAD NECK W WO CM  Result Date: 11/09/2021 CLINICAL DATA:  Stroke suspected EXAM: CT ANGIOGRAPHY HEAD AND NECK TECHNIQUE: Multidetector CT imaging of the head and neck was performed using the standard protocol during bolus administration of intravenous contrast. Multiplanar CT image reconstructions and MIPs were obtained to evaluate the vascular anatomy. Carotid stenosis measurements (when applicable) are obtained utilizing NASCET criteria, using the distal internal carotid diameter as the denominator. RADIATION DOSE REDUCTION: This exam was performed according to the departmental dose-optimization program which includes automated exposure control, adjustment of the mA and/or kV according to patient size and/or use of iterative reconstruction technique. CONTRAST:  65mL OMNIPAQUE IOHEXOL 350 MG/ML SOLN COMPARISON:  No prior CT or CTA clinical correlation is made with MRI 11/09/2021; FINDINGS: CT HEAD FINDINGS Brain: No evidence of acute cortical infarction, hemorrhage, cerebral edema, mass, mass effect, or midline shift. No hydrocephalus or extra-axial fluid collection. Vascular: No hyperdense vessel. Skull: Normal. Negative for fracture or focal lesion. Sinuses/Orbits: No acute finding. Other: Fluid in the right mastoid air cells. CTA NECK FINDINGS Aortic arch: Standard branching. Imaged portion shows no evidence of aneurysm or dissection. No significant stenosis of the major  arch vessel origins. Right carotid system: No evidence of dissection, stenosis (50% or greater) or occlusion. Left carotid system: No evidence of dissection,  stenosis (50% or greater) or occlusion. Vertebral arteries: The left vertebral artery is patent from its origin to the skull base. The right vertebral artery is diminutive, likely congenitally given the size of the vertebral artery foramen, and is patent to the skull base. No evidence of dissection, occlusion, or hemodynamically significant stenosis (greater than 50%). Skeleton: Numerous lytic lesions in the cervical and imaged thoracic spine. Possible mild vertebral body height loss at C6. Other neck: Hypoenhancing lesion in the left thyroid lobe measures up to 2.2 cm. Upper chest: Redemonstrated saddle embolus in the left main pulmonary artery, extending into the branch vessels, which was better evaluated on the 11/06/2021 CTA. Slight decrease in the size of a right pleural effusion, with new trace left pleural effusion. New ground-glass and patchy opacities in the right upper lobe (series 9, images 234, 237, 275, and 305) and superior right lower lobe (series 9, image 310), with redemonstrated opacities in the left upper lobe (series 9, image 232) and medial right upper lobe (series 9, image 295). Partially imaged inferior right upper lobe mass. Review of the MIP images confirms the above findings CTA HEAD FINDINGS Anterior circulation: Both internal carotid arteries are patent to the termini, without significant stenosis. A1 segments patent. Normal anterior communicating artery. Anterior cerebral arteries are patent to their distal aspects. No M1 stenosis or occlusion. Normal MCA bifurcations. Distal MCA branches perfused and symmetric. Posterior circulation: The right vertebral artery is quite diminutive in the proximal V4 segment and further decreased in caliber after the takeoff of the PICA (series 9, image 129); while this may represent a degree of stenosis, it could also be due to normal branching of an already diminutive vessel. The dominant left vertebral artery is patent to the vertebrobasilar junction.  Posterior inferior cerebral arteries patent bilaterally. Basilar patent to its distal aspect. Superior cerebellar arteries patent bilaterally. Patent right posterior communicating artery, with a hypoplastic right P1 and near fetal origin of the right PCA. The left P1 is patent, with a small patent left posterior communicating artery. PCAs perfused to their distal aspects without stenosis. Venous sinuses: As permitted by contrast timing, patent. Anatomic variants: Near fetal origin of the right PCA. Review of the MIP images confirms the above findings IMPRESSION: 1. Diminutive extracranial right vertebral artery, likely congenital, with further diminution of the intracranial right V4 after the takeoff of the right PICA, which may reflect a degree of stenosis and/or normal post-branching caliber of an already diminutive vessel. The left vertebral artery is dominant and patent. No intracranial large vessel occlusion or other hemodynamically significant stenosis. 2.  No hemodynamically significant stenosis in the neck. 3. Partially imaged right mid lung mass, with increased ground-glass opacities in the imaged right upper and lower lobes, concerning for worsening multifocal infection. 4. Slightly decreased right pleural effusion, with new trace left pleural effusion. 5. Lytic lesions in the cervical spine, with possible mild vertebral body height loss in C6. 6. Hypoenhancing lesion in the left thyroid lobe, which measures up to 2.2 cm. If this has not previously been evaluated, a non-emergent ultrasound of the thyroid is recommended. (Reference: J Am Coll Radiol. 2015 Feb;12(2): 143-50) 7. Electronically Signed   By: Merilyn Baba M.D.   On: 11/09/2021 21:09   DG Chest 2 View  Result Date: 10/22/2021 CLINICAL DATA:  Contusion of the rib on the left side.  Left rib pain. EXAM: CHEST - 2 VIEW COMPARISON:  None. FINDINGS: The heart size and mediastinal contours are within normal limits. Soft tissue density mass with  area of atelectasis in the right mid lung measuring approximately 3.0 x 4.9 cm. Bibasilar atelectasis. Hyperinflated lungs. No pleural effusion or pneumothorax. The visualized skeletal structures are unremarkable. IMPRESSION: 1. Soft tissue density mass in the right mid lung measuring at least 3.0 x 4.9 cm, it may represent pneumonia, loculated pleural effusion or pulmonary mass. Further evaluation with CT examination would be helpful. Follow-up to resolution is recommended. 2. Hyperinflated lungs concerning for COPD. Bibasilar atelectasis. No pleural effusion or pneumothorax. An attempt was made to reach out to the provider Dr. Clemmie Krill without response from our office at the time of dictation. Electronically Signed   By: Keane Police D.O.   On: 10/22/2021 16:07   DG Ribs Unilateral W/Chest Left  Result Date: 11/06/2021 CLINICAL DATA:  Fall, left rib pain EXAM: LEFT RIBS AND CHEST - 3+ VIEW COMPARISON:  Chest x-ray 10/22/2021 FINDINGS: No acute fracture identified in the left ribs. Cardiomediastinal silhouette is unchanged. Persistent ovoid masslike opacity adjacent to the fissure in the right lung measuring approximately 5 x 3.2 cm, grossly unchanged. Linear opacities in the left lower lung zone likely represent subsegmental atelectasis. Hyperinflated lungs with mildly prominent interstitial lung markings. No pleural effusion or pneumothorax visualized. IMPRESSION: 1. No left rib fractures identified. 2. Persistent and unchanged ovoid perifissural masslike opacity in the right lung. 3. COPD. Electronically Signed   By: Ofilia Neas M.D.   On: 11/06/2021 12:16   CT Angio Chest PE W and/or Wo Contrast  Result Date: 11/06/2021 CLINICAL DATA:  Chest pain, recent trauma EXAM: CT ANGIOGRAPHY CHEST WITH CONTRAST TECHNIQUE: Multidetector CT imaging of the chest was performed using the standard protocol during bolus administration of intravenous contrast. Multiplanar CT image reconstructions and MIPs were obtained  to evaluate the vascular anatomy. RADIATION DOSE REDUCTION: This exam was performed according to the departmental dose-optimization program which includes automated exposure control, adjustment of the mA and/or kV according to patient size and/or use of iterative reconstruction technique. CONTRAST:  136mL OMNIPAQUE IOHEXOL 350 MG/ML SOLN COMPARISON:  Chest radiographs done earlier today FINDINGS: Cardiovascular: There is homogeneous enhancement in the thoracic aorta. There is mild ectasia of main pulmonary artery measuring 3.2 cm. There is saddle embolus in the left main pulmonary artery extending into the left upper lobe and left lower lobe branches. There are few small filling defects in the subsegmental branches in the right middle lobe and right lower lobe. There are no imaging signs of right ventricular strain. Mediastinum/Nodes: No significant lymphadenopathy seen. There is 2.5 cm low-density lesion in the enlarged left lobe of thyroid. Lungs/Pleura: There is moderate right pleural effusion. There are small patchy infiltrates in the medial aspect of right upper lobe in the right upper lung fields. There is 5.2 x 4.1 cm homogeneous density inseparable from interlobar fissure in the anterior right parahilar region. There is narrowing of the lumen of the bronchus leading to this homogeneous opacity in the anteromedial right mid lung fields. Linear patchy infiltrates are seen in both lower lung fields, more so on the right side. In image 13 of series 6, there is 2 cm ground-glass density in the anterior left apex. There is moderate right pleural effusion. There is no significant left pleural effusion. There is no pneumothorax. Upper Abdomen: Fatty liver. Musculoskeletal: There are numerous lytic lesions of varying sizes in the left  ribs, thoracic spine and lumbar spine. There is decrease in height of bodies of C6 vertebra. There is also decrease in height of bodies of T6, T7, T8 and T9 vertebrae. Review of the MIP  images confirms the above findings. IMPRESSION: There is evidence of acute pulmonary embolism with small to moderate thrombus burden. There are no imaging signs of acute right ventricular strain. There is no evidence of thoracic aortic dissection. There is 4.8 cm homogeneous opacity in the anterior right mid lung fields. This lesion has to be considered primary malignant neoplasm until proven otherwise. There is narrowing of bronchus leading to this parahilar density. PET-CT and biopsy as warranted should be considered. There are numerous lytic lesions of varying sizes along with compression fractures in the cervical and thoracic spine as described in the body of the report consistent with extensive skeletal metastatic disease. Moderate right pleural effusion. There are ground-glass and patchy alveolar infiltrates in both lungs as described in the body of the report suggesting multifocal pneumonia. There is 2.5 cm low-density nodule in the left lobe of thyroid. When the patient's clinical condition permits, thyroid sonogram may be considered. Electronically Signed   By: Elmer Picker M.D.   On: 11/06/2021 15:21   MR BRAIN W WO CONTRAST  Result Date: 11/09/2021 CLINICAL DATA:  Weight loss, brain metastases suspected EXAM: MRI HEAD WITHOUT AND WITH CONTRAST TECHNIQUE: Multiplanar, multiecho pulse sequences of the brain and surrounding structures were obtained without and with intravenous contrast. CONTRAST:  15mL GADAVIST GADOBUTROL 1 MMOL/ML IV SOLN COMPARISON:  None. FINDINGS: Brain: Round, peripherally enhancing lesion in the left inferior frontal lobe, which measures up to 9 x 9 mm (series 18, image 17), associated hemosiderin deposition, concerning for a hemorrhagic metastasis. This area is associated with hemosiderin deposition. Peripherally enhancing lesion in the left frontal lobe measures up to 4 mm (series 18, image 127). Multiple additional enhancing foci are also seen, some of which correlate with  areas of restricted diffusion, described below, while some appear more masslike. These enhancing foci are seen on series 18 on the following images: 136, 120, 117, 115 113, 111, 108, 106, 104, 95, 94, 93, 92, 90, 87, 84, 80, 78, 77, 76, 70, and 59. Scattered foci of increased signal on diffusion-weighted imaging throughout the bilateral cerebral and cerebellar hemispheres, some of which demonstrate ADC correlates; for example several cortical foci and white matter areas in the posterior right frontal lobe (series 5, image 32-34), in the left anterior frontal lobe (series 5, image 27-28), areas along the right insula (series 5, image 27), left caudate (series 5, image 28), and bilateral cerebellar hemispheres (series 5, image 12-14). Some foci of increased signal on diffusion-weighted imaging do not demonstrate ADC correlates, and may reflect subacute infarcts with normalization of the ADC. However, some of these correlate with foci of enhancement such as on series 18, image 104. No acute hemorrhage or midline shift. No hydrocephalus or extra-axial collection Vascular: Normal flow voids. Skull and upper cervical spine: Abnormal enhancement in the arch (series 18, image 15) and bilateral lateral masses of C1 (series 18, image 8 and 10), as well as the anterior aspect of C2 (series 18, image 1). Small enhancing focus in the right occipital calvarium (series 18, image 72 Sinuses/Orbits: Negative. Other: Fluid throughout the right mastoid air cells. IMPRESSION: 1. Two round, peripherally enhancing lesions in the left frontal lobe, concerning for metastatic disease. Numerous additional punctate and linear areas of enhancement are also seen, some of which may represent  additional metastatic lesions, but some of which may represent enhancing, subacute infarcts. 2. Numerous foci of restricted diffusion in the bilateral cerebral and cerebellar hemispheres, some of which demonstrate ADC correlates and some of which may have  normalized ADC values. These are concerning for acute and subacute infarcts, likely embolic in etiology given multiple vascular territories. 3. Abnormal enhancement in C1, C2, and the right occipital calvarium, concerning for osseous metastatic disease. These results were called by telephone at the time of interpretation on 11/09/2021 at 3:58 am to provider [facility] , who verbally acknowledged these results. Electronically Signed   By: Merilyn Baba M.D.   On: 11/09/2021 03:59   CT ABDOMEN PELVIS W CONTRAST  Result Date: 11/06/2021 CLINICAL DATA:  Abdominal pain, acute, nonlocalized. Status post fall EXAM: CT ABDOMEN AND PELVIS WITH CONTRAST TECHNIQUE: Multidetector CT imaging of the abdomen and pelvis was performed using the standard protocol following bolus administration of intravenous contrast. RADIATION DOSE REDUCTION: This exam was performed according to the departmental dose-optimization program which includes automated exposure control, adjustment of the mA and/or kV according to patient size and/or use of iterative reconstruction technique. CONTRAST:  160mL OMNIPAQUE IOHEXOL 350 MG/ML SOLN COMPARISON:  None. FINDINGS: Lower chest: Right pleural effusion. Please see separately dictated CT angiography chest 11/06/2021. Ports and Devices: None. Liver: Not enlarged. Several vague hypodense right hepatic lobe lesions measuring 0.9, 1.4, 0.7, 0.5 cm. No laceration or subcapsular hematoma. Biliary System: The gallbladder is otherwise unremarkable with no radio-opaque gallstones. No biliary ductal dilatation. Pancreas: Normal pancreatic contour. No main pancreatic duct dilatation. Spleen: Not enlarged. No focal lesion. No laceration, subcapsular hematoma, or vascular injury. Adrenal Glands: No nodularity bilaterally. Kidneys: Bilateral kidneys enhance symmetrically. No hydronephrosis. Subcentimeter hypodensities are too small characterize. A 1.1 cm vague wedge-shaped hypodensity along the left superior renal pole  that likely represents a poorly defined lesion with a density of 45 Hounsfield units. No contusion, laceration, or subcapsular hematoma. No injury to the vascular structures or collecting systems. No hydroureter. The urinary bladder is unremarkable. Bowel: No small or large bowel wall thickening or dilatation. Non-visualization of the appendix. The appendix is not definitely identified with no inflammatory changes in the right lower quadrant to suggest acute appendicitis. Mesentery, Omentum, and Peritoneum: No simple free fluid ascites. No pneumoperitoneum. No hemoperitoneum. No mesenteric hematoma identified. No organized fluid collection. Pelvic Organs: Normal. Lymph Nodes: No abdominal, pelvic, inguinal lymphadenopathy. Vasculature: No abdominal aorta or iliac aneurysm. No active contrast extravasation or pseudoaneurysm. Musculoskeletal: No significant soft tissue hematoma. No acute pelvic fracture. No spinal fracture. Diffuse appendicular and axial skeleton lytic lesions. IMPRESSION: 1.  No acute traumatic injury to the abdomen, or pelvis. 2. No acute fracture or traumatic malalignment of the lumbar spine. 3. Several vague indeterminate hypodense right hepatic lobe lesions measuring 0.9, 1.4, 0.7, 0.5 cm. Findings could represent metastases. 4. 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 5. An indeterminate 1.1 cm vague slightly wedge-shaped hypodensity along the left superior renal pole. Finding could represent an underlying poorly defined lesion, small infarction, less likely focal pyelonephritis. 6. Please see separately dictated CT angiography chest 11/06/2021. Electronically Signed   By: Iven Finn M.D.   On: 11/06/2021 15:15   PERIPHERAL VASCULAR CATHETERIZATION  Result Date: 11/08/2021 See surgical note for result.  US BIOPSY (LIVER)  Result Date: 11/11/2021 INDICATION: Liver lesion EXAM: Ultrasound-guided core needle biopsy of liver lesion in the right hepatic lobe MEDICATIONS:  None. ANESTHESIA/SEDATION: Moderate (conscious) sedation was employed during this procedure. A  total of Versed 1 mg and Fentanyl 50 mcg was administered intravenously by the radiology nurse. Total intra-service moderate Sedation Time: 10 minutes. The patient's level of consciousness and vital signs were monitored continuously by radiology nursing throughout the procedure under my direct supervision. FLUOROSCOPY: N/a COMPLICATIONS: None immediate. PROCEDURE: Informed written consent was obtained from the patient after a thorough discussion of the procedural risks, benefits and alternatives. All questions were addressed. Maximal Sterile Barrier Technique was utilized including caps, mask, sterile gowns, sterile gloves, sterile drape, hand hygiene and skin antiseptic. A timeout was performed prior to the initiation of the procedure. The patient was placed supine on the exam table. Limited ultrasound exam of the liver was performed, which again demonstrated an approximately 1.1 cm hypoechoic nodule in the right hepatic lobe, compatible with recent CT demonstrating multiple small liver lesions. Skin entry site was marked, and the overlying skin was prepped and draped in a standard sterile fashion. Local analgesia was obtained with 1% lidocaine. Under ultrasound guidance, a 17 gauge introducer needle was advanced towards the identified lesion in the right hepatic lobe. Subsequently, core needle biopsy was performed using an 18 gauge core biopsy device x4 passes. Specimens were submitted in formalin to pathology for further handling. Limited postprocedure imaging demonstrated a small hypoechoic area deep to the lesion, suggestive of a small intraparenchymal hematoma. Review of biopsy images demonstrated likely injury to a small portal vein branch. This area was found to be stable over a short observation period. No subcapsular hematoma or bleeding into the hepatorenal space was identified. A clean dressing was placed after  manual hemostasis. The patient tolerated the procedure well. IMPRESSION: 1. Successful ultrasound-guided core needle biopsy of a 1.1 cm lesion in the right hepatic lobe. 2. Postprocedure images demonstrate a small intraparenchymal hematoma which was stable over a short observation period. Interventional radiology will follow up with this patient in the morning. IV heparin is to be held pending following day AM CBC demonstrating stable hemoglobin. Electronically Signed   By: Albin Felling M.D.   On: 11/11/2021 16:12   DG Chest Port 1 View  Result Date: 11/07/2021 CLINICAL DATA:  Status post thorenthesis EXAM: PORTABLE CHEST 1 VIEW COMPARISON:  Chest radiograph 10/22/2021. Chest CT November 06, 2021. FINDINGS: Similar versus mildly increased right midlung masslike opacity. There is some fluid tracking along the right minor fissure. No visible pneumothorax. No consolidation. Similar cardiomediastinal silhouette. IMPRESSION: 1. Similar versus mildly increased right midlung masslike opacity, further characterized on recent chest CT. There is some fluid tracking along the right minor fissure. 2. No visible pneumothorax status post thoracentesis. Electronically Signed   By: Margaretha Sheffield M.D.   On: 11/07/2021 14:43   ECHOCARDIOGRAM COMPLETE  Result Date: 11/07/2021    ECHOCARDIOGRAM REPORT   Patient Name:   AZZIE THIEM Date of Exam: 11/07/2021 Medical Rec #:  [id number]      Height:       65.5 in Accession #:    [id number]     Weight:       125.7 lb Date of Birth:  1956/09/25      BSA:          1.633 m Patient Age:    66 years       BP:           124/80 mmHg Patient Gender: F              HR:           78  bpm. Exam Location:  [facility] Procedure: 2D Echo, Cardiac Doppler and Color Doppler Indications:     Pulmonary Embolism I26.09  History:         Patient has no prior history of Echocardiogram examinations.                  Pulmonary emboli, acute pulmonary embolism.  Sonographer:     Sherrie Sport Referring Phys:   [id number] Lequita Halt Diagnosing Phys: Donnelly Angelica IMPRESSIONS  1. Left ventricular ejection fraction, by estimation, is 60 to 65%. The left ventricle has normal function. The left ventricle has no regional wall motion abnormalities. Left ventricular diastolic parameters are consistent with Grade I diastolic dysfunction (impaired relaxation).  2. Right ventricular systolic function is normal. The right ventricular size is mildly enlarged. There is mildly elevated pulmonary artery systolic pressure. The estimated right ventricular systolic pressure is 20.2 mmHg.  3. Focal thickening of MV leaflet. Correlate clinically. . The mitral valve is degenerative. Mild mitral valve regurgitation. No evidence of mitral stenosis.  4. The aortic valve is normal in structure. Aortic valve regurgitation is not visualized. No aortic stenosis is present.  5. The inferior vena cava is normal in size with <50% respiratory variability, suggesting right atrial pressure of 8 mmHg. FINDINGS  Left Ventricle: Left ventricular ejection fraction, by estimation, is 60 to 65%. The left ventricle has normal function. The left ventricle has no regional wall motion abnormalities. The left ventricular internal cavity size was normal in size. There is  no left ventricular hypertrophy. Left ventricular diastolic parameters are consistent with Grade I diastolic dysfunction (impaired relaxation). Right Ventricle: The right ventricular size is mildly enlarged. No increase in right ventricular wall thickness. Right ventricular systolic function is normal. There is mildly elevated pulmonary artery systolic pressure. The tricuspid regurgitant velocity is 2.81 m/s, and with an assumed right atrial pressure of 8 mmHg, the estimated right ventricular systolic pressure is 54.2 mmHg. Left Atrium: Left atrial size was normal in size. Right Atrium: Right atrial size was normal in size. Pericardium: There is no evidence of pericardial effusion. Mitral Valve: Focal  thickening of MV leaflet. Correlate clinically. The mitral valve is degenerative in appearance. Mild mitral valve regurgitation. No evidence of mitral valve stenosis. MV peak gradient, 6.7 mmHg. The mean mitral valve gradient is 3.0 mmHg. Tricuspid Valve: The tricuspid valve is normal in structure. Tricuspid valve regurgitation is mild . No evidence of tricuspid stenosis. Aortic Valve: The aortic valve is normal in structure. Aortic valve regurgitation is not visualized. No aortic stenosis is present. Aortic valve mean gradient measures 3.0 mmHg. Aortic valve peak gradient measures 4.8 mmHg. Aortic valve area, by VTI measures 2.53 cm. Pulmonic Valve: The pulmonic valve was normal in structure. Pulmonic valve regurgitation is not visualized. No evidence of pulmonic stenosis. Aorta: The aortic root is normal in size and structure. Venous: The inferior vena cava is normal in size with less than 50% respiratory variability, suggesting right atrial pressure of 8 mmHg. IAS/Shunts: No atrial level shunt detected by color flow Doppler.  LEFT VENTRICLE PLAX 2D LVIDd:         3.70 cm   Diastology LVIDs:         2.30 cm   LV e' medial:    5.11 cm/s LV PW:         1.40 cm   LV E/e' medial:  15.9 LV IVS:        1.10 cm   LV e' lateral:  4.35 cm/s LVOT diam:     2.00 cm   LV E/e' lateral: 18.7 LV SV:         52 LV SV Index:   32 LVOT Area:     3.14 cm  RIGHT VENTRICLE RV Basal diam:  2.90 cm RV S prime:     13.10 cm/s TAPSE (M-mode): 2.1 cm LEFT ATRIUM             Index        RIGHT ATRIUM           Index LA diam:        2.50 cm 1.53 cm/m   RA Area:     14.40 cm LA Vol (A2C):   55.7 ml 34.11 ml/m  RA Volume:   34.30 ml  21.01 ml/m LA Vol (A4C):   50.6 ml 30.99 ml/m LA Biplane Vol: 53.7 ml 32.89 ml/m  AORTIC VALVE                    PULMONIC VALVE AV Area (Vmax):    2.69 cm     PV Vmax:        0.70 m/s AV Area (Vmean):   2.50 cm     PV Vmean:       47.133 cm/s AV Area (VTI):     2.53 cm     PV VTI:         0.128 m AV  Vmax:           109.00 cm/s  PV Peak grad:   1.9 mmHg AV Vmean:          74.700 cm/s  PV Mean grad:   1.0 mmHg AV VTI:            0.206 m      RVOT Peak grad: 3 mmHg AV Peak Grad:      4.8 mmHg AV Mean Grad:      3.0 mmHg LVOT Vmax:         93.30 cm/s LVOT Vmean:        59.400 cm/s LVOT VTI:          0.166 m LVOT/AV VTI ratio: 0.81  AORTA Ao Root diam: 3.17 cm MITRAL VALVE                TRICUSPID VALVE MV Area (PHT): 3.17 cm     TR Peak grad:   31.6 mmHg MV Area VTI:   1.83 cm     TR Vmax:        281.00 cm/s MV Peak grad:  6.7 mmHg MV Mean grad:  3.0 mmHg     SHUNTS MV Vmax:       1.29 m/s     Systemic VTI:  0.17 m MV Vmean:      75.9 cm/s    Systemic Diam: 2.00 cm MV Decel Time: 239 msec     Pulmonic VTI:  0.158 m MV E velocity: 81.40 cm/s MV A velocity: 109.00 cm/s MV E/A ratio:  0.75 Donnelly Angelica Electronically signed by Donnelly Angelica Signature Date/Time: 11/07/2021/1:06:16 PM    Final    US THORACENTESIS ASP PLEURAL SPACE W/IMG GUIDE  Result Date: 11/07/2021 INDICATION: 66 year old with right lung mass and concern for metastatic disease. EXAM: ULTRASOUND GUIDED RIGHT THORACENTESIS MEDICATIONS: None. COMPLICATIONS: None immediate. PROCEDURE: An ultrasound guided thoracentesis was thoroughly discussed with the patient and questions answered. The benefits, risks, alternatives and complications were also discussed. The patient understands and  wishes to proceed with the procedure. Written consent was obtained. Ultrasound was performed to localize and mark an adequate pocket of fluid in the right chest. The area was then prepped and draped in the normal sterile fashion. 1% Lidocaine was used for local anesthesia. Under ultrasound guidance a 6 Fr Safe-T-Centesis catheter was introduced. Thoracentesis was performed. The catheter was removed and a dressing applied. FINDINGS: A total of approximately 350 mL of amber colored fluid was removed. Samples were sent to the laboratory as requested by the clinical team.  IMPRESSION: Successful ultrasound guided right thoracentesis yielding 350 mL of pleural fluid. Electronically Signed   By: Markus Daft M.D.   On: 11/07/2021 15:01    Assessment and plan-   Patient is a 66 y.o. female with known DVT on Eliquis presented with chest pain and unsteady gait.   work-up showed pulmonary embolism, right pleural effusion, lung mass with skeletal liver lesions, metastatic brain lesions and stroke.   #Acute pulmonary embolism  I recommend transition to Xarelto at discharge.  Currently heparin was held due to biopsy induced hematoma.  Monitor hemoglobin.   #Right lung mass, right pleural effusion, multiple skeletal and liver lesions.  Status post thoracentesis, awaiting cytology report and fluid analysis. Suspecting stage IV lung cancer.  Status post liver lesion biopsy today.  Awaiting for pathology report. I had a lengthy discussion with patient and his sister.  It is premature to discuss about prognosis however with current information, prognosis likely poor due to extensive metastasis including CNS lesions.  Depending on the final pathology, and if any targetable mutation, immunotherapy marker, will further discuss with the patient about her prognosis. Patient will need outpatient follow-up with oncology.  We will schedule patient to establish care with radiation oncology for radiation.  #Multiple acute and subacute stroke, probably embolic source.  Neurology saw patient.  TEE is not indicated per neurology as patient will be on anticoagulation.  She has completed stroke work-up.  She will follow-up outpatient with neurology.  Continue statin.  Thank you for to allowing me to participate in the care of this patient.   Earlie Server, MD, PhD Hematology Oncology 11/11/2021

## 2021-11-11 NOTE — Consult Note (Signed)
ANTICOAGULATION CONSULT NOTE   Pharmacy Consult for Heparin dosing Indication: pulmonary embolus  Allergies  Allergen Reactions   Sulfa Antibiotics Nausea Only and Nausea And Vomiting    Patient Measurements: Height: 5' 5.51" (166.4 cm) Weight: 56 kg (123 lb 7.3 oz) IBW/kg (Calculated) : 58.18 Heparin Dosing Weight: 52.2kg  Vital Signs: Temp: 98.3 F (36.8 C) (02/06 0420) Temp Source: Oral (02/06 0420) BP: 121/63 (02/06 0420) Pulse Rate: 97 (02/06 0420)  Labs: Recent Labs    11/09/21 0219 11/10/21 0441 11/11/21 0551  HGB 10.1* 10.5* 9.3*  HCT 32.1* 32.9* 29.2*  PLT 229 249 235  HEPARINUNFRC 0.49 0.51 0.39  CREATININE 0.95 0.88  --      Estimated Creatinine Clearance: 55.6 mL/min (by C-G formula based on SCr of 0.88 mg/dL).   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   Acute pulmonary embolism ([place]) 11/06/2021   Bipolar 2 disorder ([facility])    Body mass index (BMI) 19.9 or less, adult 11/09/2021   CVA (cerebral vascular accident) ([place]) 11/09/2021   DVT of leg (deep venous thrombosis) ([place]) 11/09/2021   Dysplastic nevus    R calf, txted in past, pt states ~2002   Fibromyalgia    Psoriasis    Thyroid nodule 11/09/2021    Medications:  Apixaban 5mg  BID (for DVT lower extremity)  Assessment: patient is a 66 year old female recently diagnosed with a DVT about 3 weeks ago who is on Eliquis who presents with some left upper abdominal pain and back pain for several weeks.CT positive for pulmonary emboli.  2/3 0656 HL 0.60, aPTT 67  -- Therapuetic  Goal of Therapy:  Heparin level 0.3-0.7 units/ml aPTT 66-102 seconds Monitor platelets by anticoagulation protocol: Yes   Plan:  2/6:  HL @ 2751 = 0.39, therapeutic X 4 Will continue pt on current rate and recheck HL on 2/7 with AM labs.   Orene Desanctis, PharmD Clinical Pharmacist   11/11/2021,6:54 AM

## 2021-11-11 NOTE — Assessment & Plan Note (Addendum)
--   Concerning for primary lung cancer, imaging concerning for metastatic disease to skeleton, liver, brain.   --Follow-up cytology from pleural effusion.   --Liver biopsy done --outpatient oncology f/u

## 2021-11-11 NOTE — Assessment & Plan Note (Addendum)
--  developed during liver biopsy on 2/6 --resume anticoagulation this morning with Xarelto --monitor bleeding and Hgb while on anticoagulation.

## 2021-11-11 NOTE — Assessment & Plan Note (Addendum)
--   Diagnosed prior to admission --will discharge on Xarelto, per oncology

## 2021-11-11 NOTE — Assessment & Plan Note (Addendum)
--   Asymptomatic, no hypoxia, hemodynamics stable.  Although troponins were elevated consistent with demand ischemia, 2D echocardiogram reassuring.  Status post thrombectomy -- Presumed secondary to acquired thrombophilia from presumed malignancy Plan: --resume anticoagulation this morning with Xarelto, which pt will be discharged with, per oncology --monitor today to ensure no more bleeding from liver biopsy site

## 2021-11-12 ENCOUNTER — Encounter: Payer: Self-pay | Admitting: *Deleted

## 2021-11-12 ENCOUNTER — Other Ambulatory Visit: Payer: Self-pay | Admitting: Oncology

## 2021-11-12 DIAGNOSIS — C3490 Malignant neoplasm of unspecified part of unspecified bronchus or lung: Secondary | ICD-10-CM

## 2021-11-12 LAB — CBC
HCT: 33.2 % — ABNORMAL LOW (ref 36.0–46.0)
Hemoglobin: 10.4 g/dL — ABNORMAL LOW (ref 12.0–15.0)
MCH: 29.5 pg (ref 26.0–34.0)
MCHC: 31.3 g/dL (ref 30.0–36.0)
MCV: 94.1 fL (ref 80.0–100.0)
Platelets: 143 10*3/uL — ABNORMAL LOW (ref 150–400)
RBC: 3.53 MIL/uL — ABNORMAL LOW (ref 3.87–5.11)
RDW: 13.4 % (ref 11.5–15.5)
WBC: 15.1 10*3/uL — ABNORMAL HIGH (ref 4.0–10.5)
nRBC: 0 % (ref 0.0–0.2)

## 2021-11-12 LAB — CYTOLOGY - NON PAP

## 2021-11-12 LAB — HEPARIN LEVEL (UNFRACTIONATED): Heparin Unfractionated: 0.31 IU/mL (ref 0.30–0.70)

## 2021-11-12 MED ORDER — RIVAROXABAN 15 MG PO TABS
15.0000 mg | ORAL_TABLET | Freq: Two times a day (BID) | ORAL | Status: DC
  Administered 2021-11-12 – 2021-11-13 (×4): 15 mg via ORAL
  Filled 2021-11-12 (×6): qty 1

## 2021-11-12 MED ORDER — HEPARIN (PORCINE) 25000 UT/250ML-% IV SOLN: 1050.0000 [IU]/h | INTRAVENOUS | Status: DC

## 2021-11-12 MED ORDER — RIVAROXABAN 20 MG PO TABS: 20.0000 mg | ORAL_TABLET | Freq: Every day | ORAL | Status: DC

## 2021-11-12 MED ORDER — BUTALBITAL-APAP-CAFFEINE 50-325-40 MG PO TABS
2.0000 | ORAL_TABLET | Freq: Once | ORAL | Status: AC
  Administered 2021-11-12: 2 via ORAL
  Filled 2021-11-12: qty 2

## 2021-11-12 NOTE — Progress Notes (Signed)
Pulmonary Medicine          Date: 11/12/2021,   MRN# [id number] Cheryl Schroeder Jul 23, 1956    HISTORY OF PRESENT ILLNESS   Pleasant, unfortunately lady, small post hepatic bx hematoma. Vascular ok restarting heparin. ( Dvt/pe, s/p ivc filter placement). 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   Acute pulmonary embolism ([place]) 11/06/2021   Bipolar 2 disorder ([facility])    Body mass index (BMI) 19.9 or less, adult 11/09/2021   CVA (cerebral vascular accident) ([place]) 11/09/2021   DVT of leg (deep venous thrombosis) ([place]) 11/09/2021   Dysplastic nevus    R calf, txted in past, pt states ~2002   Fibromyalgia    Psoriasis    Thyroid nodule 11/09/2021     SURGICAL HISTORY   Past Surgical History:  Procedure Laterality Date   ABDOMINAL HYSTERECTOMY     ADENOIDECTOMY     BREAST BIOPSY Right 1990   cyst   PULMONARY THROMBECTOMY Left 11/08/2021   Procedure: PULMONARY THROMBECTOMY;  Surgeon: Katha Cabal, MD;  Location: [facility];  Service: Cardiovascular;  Laterality: Left;   TONSILLECTOMY       FAMILY HISTORY   Family History  Problem Relation Age of Onset   Breast cancer Maternal Aunt 72   Lung cancer Father      SOCIAL HISTORY   Social History   Tobacco Use   Smoking status: Never   Smokeless tobacco: Never  Vaping Use   Vaping Use: Never used  Substance Use Topics   Alcohol use: No   Drug use: No     MEDICATIONS    Home Medication:    Current Medication:  Current Facility-Administered Medications:    0.9 %  sodium chloride infusion, 250 mL, Intravenous, PRN, Schnier, Dolores Lory, MD   acetaminophen (TYLENOL) tablet 650 mg, 650 mg, Oral, Q4H PRN, Schnier, Dolores Lory, MD, 650 mg at 11/06/21 2019   ALPRAZolam (XANAX) tablet 0.5-1 mg, 0.5-1 mg, Oral, QHS PRN, Schnier, Dolores Lory, MD, 1 mg at 11/09/21 0147   feeding  supplement (ENSURE ENLIVE / ENSURE PLUS) liquid 237 mL, 237 mL, Oral, BID BM, Schnier, Dolores Lory, MD, 237 mL at 11/10/21 1400   fentaNYL (SUBLIMAZE) injection, , Intravenous, PRN, El-Abd, Joesph Fillers, MD, 50 mcg at 11/11/21 1515   FLUoxetine (PROZAC) capsule 40 mg, 40 mg, Oral, Daily, Schnier, Dolores Lory, MD, 40 mg at 11/12/21 0823   fluticasone (FLONASE) 50 MCG/ACT nasal spray 1 spray, 1 spray, Each Nare, Daily, Schnier, Dolores Lory, MD   gabapentin (NEURONTIN) capsule 300 mg, 300 mg, Oral, TID, Schnier, Dolores Lory, MD, 300 mg at 11/12/21 1509   HYDROcodone-acetaminophen (NORCO/VICODIN) 5-325 MG per tablet 1 tablet, 1 tablet, Oral, Q4H PRN, Schnier, Dolores Lory, MD, 1 tablet at 11/11/21 1929   HYDROmorphone (DILAUDID) injection 0.5 mg, 0.5 mg, Intravenous, Q4H PRN, Schnier, Dolores Lory, MD, 0.5 mg at 11/12/21 0616   lamoTRIgine (LAMICTAL) tablet 200 mg, 200 mg, Oral, Daily, Schnier, Dolores Lory, MD, 200 mg at 11/12/21 0932   midazolam (VERSED) 5 MG/5ML injection, , Intravenous, PRN, El-Abd, Joesph Fillers, MD, 1 mg at 11/11/21 1515   multivitamin with minerals tablet 1 tablet, 1 tablet, Oral, Daily, Schnier, Dolores Lory, MD, 1 tablet at 11/12/21 0825   ondansetron (ZOFRAN) injection 4 mg, 4 mg, Intravenous, Q6H PRN, Schnier, Dolores Lory, MD, 4 mg  at 11/12/21 1100   oxyCODONE (Oxy IR/ROXICODONE) immediate release tablet 5 mg, 5 mg, Oral, Q4H PRN, Schnier, Dolores Lory, MD, 5 mg at 11/11/21 0846   pantoprazole (PROTONIX) EC tablet 40 mg, 40 mg, Oral, Daily, Schnier, Dolores Lory, MD, 40 mg at 11/12/21 6503   polyethylene glycol (MIRALAX / GLYCOLAX) packet 17 g, 17 g, Oral, Daily, Schnier, Dolores Lory, MD, 17 g at 11/10/21 0954   pravastatin (PRAVACHOL) tablet 40 mg, 40 mg, Oral, QHS, Schnier, Dolores Lory, MD, 40 mg at 11/11/21 2240   Rivaroxaban (XARELTO) tablet 15 mg, 15 mg, Oral, BID WC, 15 mg at 11/12/21 1605 **FOLLOWED BY** [START ON 12/03/2021] rivaroxaban (XARELTO) tablet 20 mg, 20 mg, Oral, Q supper, Nazari, Walid A, RPH    senna-docusate (Senokot-S) tablet 2 tablet, 2 tablet, Oral, BID, Schnier, Dolores Lory, MD, 2 tablet at 11/12/21 5465   sodium chloride flush (NS) 0.9 % injection 3 mL, 3 mL, Intravenous, Q12H, Schnier, Dolores Lory, MD, 3 mL at 11/12/21 0820   sodium chloride flush (NS) 0.9 % injection 3 mL, 3 mL, Intravenous, PRN, Schnier, Dolores Lory, MD    ALLERGIES   Sulfa antibiotics     REVIEW OF SYSTEMS    Review of Systems:  Gen:  Denies  fever, sweats, chills weigh loss  HEENT: Denies blurred vision, double vision, ear pain, eye pain, hearing loss, nose bleeds, sore throat Cardiac:  No dizziness, chest pain or heaviness, chest tightness,edema Resp:   Denies cough or sputum porduction, shortness of breath,wheezing, hemoptysis,  Gi: Denies swallowing difficulty, stomach pain, nausea or vomiting, diarrhea, constipation, bowel incontinence Gu:  Denies bladder incontinence, burning urine Ext:   Denies Joint pain, stiffness or swelling Skin: Denies  skin rash, easy bruising or bleeding or hives Endoc:  Denies polyuria, polydipsia , polyphagia or weight change Psych:   Denies depression, insomnia or hallucinations   Other:  All other systems negative   VS: BP 106/70 (BP Location: Left Arm)    Pulse (!) 102    Temp 98.9 F (37.2 C) (Oral)    Resp 16    Ht 5' 5.51" (1.664 m)    Wt 56 kg    SpO2 92%    BMI 20.22 kg/m      PHYSICAL EXAM    GENERAL:NAD, no fevers, chills, no weakness no fatigue HEAD: Normocephalic, atraumatic.  EYES: Pupils equal, round, reactive to light. Extraocular muscles intact. No scleral icterus.  MOUTH: Moist mucosal membrane. Dentition intact. No abscess noted.  EAR, NOSE, THROAT: Clear without exudates. No external lesions.  NECK: Supple. No thyromegaly. No nodules. No JVD.  PULMONARY: Diffuse coarse rhonchi right sided +wheezes CARDIOVASCULAR: S1 and S2. Regular rate and rhythm. No murmurs, rubs, or gallops. No edema. Pedal pulses 2+ bilaterally.  GASTROINTESTINAL:  Soft, nontender, nondistended. No masses. Positive bowel sounds. No hepatosplenomegaly.  MUSCULOSKELETAL: No swelling, clubbing, or edema. Range of motion full in all extremities.  NEUROLOGIC: Cranial nerves II through XII are intact. No gross focal neurological deficits. Sensation intact. Reflexes intact.  SKIN: No ulceration, lesions, rashes, or cyanosis. Skin warm and dry. Turgor intact.  PSYCHIATRIC: Mood, affect within normal limits. The patient is awake, alert and oriented x 3. Insight, judgment intact.       IMAGING    CT ANGIO HEAD NECK W WO CM  Result Date: 11/09/2021 CLINICAL DATA:  Stroke suspected EXAM: CT ANGIOGRAPHY HEAD AND NECK TECHNIQUE: Multidetector CT imaging of the head and neck was performed using the standard protocol during bolus  administration of intravenous contrast. Multiplanar CT image reconstructions and MIPs were obtained to evaluate the vascular anatomy. Carotid stenosis measurements (when applicable) are obtained utilizing NASCET criteria, using the distal internal carotid diameter as the denominator. RADIATION DOSE REDUCTION: This exam was performed according to the departmental dose-optimization program which includes automated exposure control, adjustment of the mA and/or kV according to patient size and/or use of iterative reconstruction technique. CONTRAST:  36mL OMNIPAQUE IOHEXOL 350 MG/ML SOLN COMPARISON:  No prior CT or CTA clinical correlation is made with MRI 11/09/2021; FINDINGS: CT HEAD FINDINGS Brain: No evidence of acute cortical infarction, hemorrhage, cerebral edema, mass, mass effect, or midline shift. No hydrocephalus or extra-axial fluid collection. Vascular: No hyperdense vessel. Skull: Normal. Negative for fracture or focal lesion. Sinuses/Orbits: No acute finding. Other: Fluid in the right mastoid air cells. CTA NECK FINDINGS Aortic arch: Standard branching. Imaged portion shows no evidence of aneurysm or dissection. No significant stenosis of the  major arch vessel origins. Right carotid system: No evidence of dissection, stenosis (50% or greater) or occlusion. Left carotid system: No evidence of dissection, stenosis (50% or greater) or occlusion. Vertebral arteries: The left vertebral artery is patent from its origin to the skull base. The right vertebral artery is diminutive, likely congenitally given the size of the vertebral artery foramen, and is patent to the skull base. No evidence of dissection, occlusion, or hemodynamically significant stenosis (greater than 50%). Skeleton: Numerous lytic lesions in the cervical and imaged thoracic spine. Possible mild vertebral body height loss at C6. Other neck: Hypoenhancing lesion in the left thyroid lobe measures up to 2.2 cm. Upper chest: Redemonstrated saddle embolus in the left main pulmonary artery, extending into the branch vessels, which was better evaluated on the 11/06/2021 CTA. Slight decrease in the size of a right pleural effusion, with new trace left pleural effusion. New ground-glass and patchy opacities in the right upper lobe (series 9, images 234, 237, 275, and 305) and superior right lower lobe (series 9, image 310), with redemonstrated opacities in the left upper lobe (series 9, image 232) and medial right upper lobe (series 9, image 295). Partially imaged inferior right upper lobe mass. Review of the MIP images confirms the above findings CTA HEAD FINDINGS Anterior circulation: Both internal carotid arteries are patent to the termini, without significant stenosis. A1 segments patent. Normal anterior communicating artery. Anterior cerebral arteries are patent to their distal aspects. No M1 stenosis or occlusion. Normal MCA bifurcations. Distal MCA branches perfused and symmetric. Posterior circulation: The right vertebral artery is quite diminutive in the proximal V4 segment and further decreased in caliber after the takeoff of the PICA (series 9, image 129); while this may represent a degree  of stenosis, it could also be due to normal branching of an already diminutive vessel. The dominant left vertebral artery is patent to the vertebrobasilar junction. Posterior inferior cerebral arteries patent bilaterally. Basilar patent to its distal aspect. Superior cerebellar arteries patent bilaterally. Patent right posterior communicating artery, with a hypoplastic right P1 and near fetal origin of the right PCA. The left P1 is patent, with a small patent left posterior communicating artery. PCAs perfused to their distal aspects without stenosis. Venous sinuses: As permitted by contrast timing, patent. Anatomic variants: Near fetal origin of the right PCA. Review of the MIP images confirms the above findings IMPRESSION: 1. Diminutive extracranial right vertebral artery, likely congenital, with further diminution of the intracranial right V4 after the takeoff of the right PICA, which may reflect a  degree of stenosis and/or normal post-branching caliber of an already diminutive vessel. The left vertebral artery is dominant and patent. No intracranial large vessel occlusion or other hemodynamically significant stenosis. 2.  No hemodynamically significant stenosis in the neck. 3. Partially imaged right mid lung mass, with increased ground-glass opacities in the imaged right upper and lower lobes, concerning for worsening multifocal infection. 4. Slightly decreased right pleural effusion, with new trace left pleural effusion. 5. Lytic lesions in the cervical spine, with possible mild vertebral body height loss in C6. 6. Hypoenhancing lesion in the left thyroid lobe, which measures up to 2.2 cm. If this has not previously been evaluated, a non-emergent ultrasound of the thyroid is recommended. (Reference: J Am Coll Radiol. 2015 Feb;12(2): 143-50) 7. Electronically Signed   By: Merilyn Baba M.D.   On: 11/09/2021 21:09   DG Chest 2 View  Result Date: 10/22/2021 CLINICAL DATA:  Contusion of the rib on the left side.  Left rib pain. EXAM: CHEST - 2 VIEW COMPARISON:  None. FINDINGS: The heart size and mediastinal contours are within normal limits. Soft tissue density mass with area of atelectasis in the right mid lung measuring approximately 3.0 x 4.9 cm. Bibasilar atelectasis. Hyperinflated lungs. No pleural effusion or pneumothorax. The visualized skeletal structures are unremarkable. IMPRESSION: 1. Soft tissue density mass in the right mid lung measuring at least 3.0 x 4.9 cm, it may represent pneumonia, loculated pleural effusion or pulmonary mass. Further evaluation with CT examination would be helpful. Follow-up to resolution is recommended. 2. Hyperinflated lungs concerning for COPD. Bibasilar atelectasis. No pleural effusion or pneumothorax. An attempt was made to reach out to the provider Dr. Clemmie Krill without response from our office at the time of dictation. Electronically Signed   By: Keane Police D.O.   On: 10/22/2021 16:07   DG Ribs Unilateral W/Chest Left  Result Date: 11/06/2021 CLINICAL DATA:  Fall, left rib pain EXAM: LEFT RIBS AND CHEST - 3+ VIEW COMPARISON:  Chest x-ray 10/22/2021 FINDINGS: No acute fracture identified in the left ribs. Cardiomediastinal silhouette is unchanged. Persistent ovoid masslike opacity adjacent to the fissure in the right lung measuring approximately 5 x 3.2 cm, grossly unchanged. Linear opacities in the left lower lung zone likely represent subsegmental atelectasis. Hyperinflated lungs with mildly prominent interstitial lung markings. No pleural effusion or pneumothorax visualized. IMPRESSION: 1. No left rib fractures identified. 2. Persistent and unchanged ovoid perifissural masslike opacity in the right lung. 3. COPD. Electronically Signed   By: Ofilia Neas M.D.   On: 11/06/2021 12:16   CT Angio Chest PE W and/or Wo Contrast  Result Date: 11/06/2021 CLINICAL DATA:  Chest pain, recent trauma EXAM: CT ANGIOGRAPHY CHEST WITH CONTRAST TECHNIQUE: Multidetector CT imaging of the  chest was performed using the standard protocol during bolus administration of intravenous contrast. Multiplanar CT image reconstructions and MIPs were obtained to evaluate the vascular anatomy. RADIATION DOSE REDUCTION: This exam was performed according to the departmental dose-optimization program which includes automated exposure control, adjustment of the mA and/or kV according to patient size and/or use of iterative reconstruction technique. CONTRAST:  151mL OMNIPAQUE IOHEXOL 350 MG/ML SOLN COMPARISON:  Chest radiographs done earlier today FINDINGS: Cardiovascular: There is homogeneous enhancement in the thoracic aorta. There is mild ectasia of main pulmonary artery measuring 3.2 cm. There is saddle embolus in the left main pulmonary artery extending into the left upper lobe and left lower lobe branches. There are few small filling defects in the subsegmental branches in the right  middle lobe and right lower lobe. There are no imaging signs of right ventricular strain. Mediastinum/Nodes: No significant lymphadenopathy seen. There is 2.5 cm low-density lesion in the enlarged left lobe of thyroid. Lungs/Pleura: There is moderate right pleural effusion. There are small patchy infiltrates in the medial aspect of right upper lobe in the right upper lung fields. There is 5.2 x 4.1 cm homogeneous density inseparable from interlobar fissure in the anterior right parahilar region. There is narrowing of the lumen of the bronchus leading to this homogeneous opacity in the anteromedial right mid lung fields. Linear patchy infiltrates are seen in both lower lung fields, more so on the right side. In image 13 of series 6, there is 2 cm ground-glass density in the anterior left apex. There is moderate right pleural effusion. There is no significant left pleural effusion. There is no pneumothorax. Upper Abdomen: Fatty liver. Musculoskeletal: There are numerous lytic lesions of varying sizes in the left ribs, thoracic spine and  lumbar spine. There is decrease in height of bodies of C6 vertebra. There is also decrease in height of bodies of T6, T7, T8 and T9 vertebrae. Review of the MIP images confirms the above findings. IMPRESSION: There is evidence of acute pulmonary embolism with small to moderate thrombus burden. There are no imaging signs of acute right ventricular strain. There is no evidence of thoracic aortic dissection. There is 4.8 cm homogeneous opacity in the anterior right mid lung fields. This lesion has to be considered primary malignant neoplasm until proven otherwise. There is narrowing of bronchus leading to this parahilar density. PET-CT and biopsy as warranted should be considered. There are numerous lytic lesions of varying sizes along with compression fractures in the cervical and thoracic spine as described in the body of the report consistent with extensive skeletal metastatic disease. Moderate right pleural effusion. There are ground-glass and patchy alveolar infiltrates in both lungs as described in the body of the report suggesting multifocal pneumonia. There is 2.5 cm low-density nodule in the left lobe of thyroid. When the patient's clinical condition permits, thyroid sonogram may be considered. Electronically Signed   By: Elmer Picker M.D.   On: 11/06/2021 15:21   MR BRAIN W WO CONTRAST  Result Date: 11/09/2021 CLINICAL DATA:  Weight loss, brain metastases suspected EXAM: MRI HEAD WITHOUT AND WITH CONTRAST TECHNIQUE: Multiplanar, multiecho pulse sequences of the brain and surrounding structures were obtained without and with intravenous contrast. CONTRAST:  20mL GADAVIST GADOBUTROL 1 MMOL/ML IV SOLN COMPARISON:  None. FINDINGS: Brain: Round, peripherally enhancing lesion in the left inferior frontal lobe, which measures up to 9 x 9 mm (series 18, image 17), associated hemosiderin deposition, concerning for a hemorrhagic metastasis. This area is associated with hemosiderin deposition. Peripherally  enhancing lesion in the left frontal lobe measures up to 4 mm (series 18, image 127). Multiple additional enhancing foci are also seen, some of which correlate with areas of restricted diffusion, described below, while some appear more masslike. These enhancing foci are seen on series 18 on the following images: 136, 120, 117, 115 113, 111, 108, 106, 104, 95, 94, 93, 92, 90, 87, 84, 80, 78, 77, 76, 70, and 59. Scattered foci of increased signal on diffusion-weighted imaging throughout the bilateral cerebral and cerebellar hemispheres, some of which demonstrate ADC correlates; for example several cortical foci and white matter areas in the posterior right frontal lobe (series 5, image 32-34), in the left anterior frontal lobe (series 5, image 27-28), areas along the right insula (  series 5, image 27), left caudate (series 5, image 28), and bilateral cerebellar hemispheres (series 5, image 12-14). Some foci of increased signal on diffusion-weighted imaging do not demonstrate ADC correlates, and may reflect subacute infarcts with normalization of the ADC. However, some of these correlate with foci of enhancement such as on series 18, image 104. No acute hemorrhage or midline shift. No hydrocephalus or extra-axial collection Vascular: Normal flow voids. Skull and upper cervical spine: Abnormal enhancement in the arch (series 18, image 15) and bilateral lateral masses of C1 (series 18, image 8 and 10), as well as the anterior aspect of C2 (series 18, image 1). Small enhancing focus in the right occipital calvarium (series 18, image 72 Sinuses/Orbits: Negative. Other: Fluid throughout the right mastoid air cells. IMPRESSION: 1. Two round, peripherally enhancing lesions in the left frontal lobe, concerning for metastatic disease. Numerous additional punctate and linear areas of enhancement are also seen, some of which may represent additional metastatic lesions, but some of which may represent enhancing, subacute infarcts.  2. Numerous foci of restricted diffusion in the bilateral cerebral and cerebellar hemispheres, some of which demonstrate ADC correlates and some of which may have normalized ADC values. These are concerning for acute and subacute infarcts, likely embolic in etiology given multiple vascular territories. 3. Abnormal enhancement in C1, C2, and the right occipital calvarium, concerning for osseous metastatic disease. These results were called by telephone at the time of interpretation on 11/09/2021 at 3:58 am to provider [facility] , who verbally acknowledged these results. Electronically Signed   By: Merilyn Baba M.D.   On: 11/09/2021 03:59   CT ABDOMEN PELVIS W CONTRAST  Result Date: 11/06/2021 CLINICAL DATA:  Abdominal pain, acute, nonlocalized. Status post fall EXAM: CT ABDOMEN AND PELVIS WITH CONTRAST TECHNIQUE: Multidetector CT imaging of the abdomen and pelvis was performed using the standard protocol following bolus administration of intravenous contrast. RADIATION DOSE REDUCTION: This exam was performed according to the departmental dose-optimization program which includes automated exposure control, adjustment of the mA and/or kV according to patient size and/or use of iterative reconstruction technique. CONTRAST:  179mL OMNIPAQUE IOHEXOL 350 MG/ML SOLN COMPARISON:  None. FINDINGS: Lower chest: Right pleural effusion. Please see separately dictated CT angiography chest 11/06/2021. Ports and Devices: None. Liver: Not enlarged. Several vague hypodense right hepatic lobe lesions measuring 0.9, 1.4, 0.7, 0.5 cm. No laceration or subcapsular hematoma. Biliary System: The gallbladder is otherwise unremarkable with no radio-opaque gallstones. No biliary ductal dilatation. Pancreas: Normal pancreatic contour. No main pancreatic duct dilatation. Spleen: Not enlarged. No focal lesion. No laceration, subcapsular hematoma, or vascular injury. Adrenal Glands: No nodularity bilaterally. Kidneys: Bilateral kidneys enhance  symmetrically. No hydronephrosis. Subcentimeter hypodensities are too small characterize. A 1.1 cm vague wedge-shaped hypodensity along the left superior renal pole that likely represents a poorly defined lesion with a density of 45 Hounsfield units. No contusion, laceration, or subcapsular hematoma. No injury to the vascular structures or collecting systems. No hydroureter. The urinary bladder is unremarkable. Bowel: No small or large bowel wall thickening or dilatation. Non-visualization of the appendix. The appendix is not definitely identified with no inflammatory changes in the right lower quadrant to suggest acute appendicitis. Mesentery, Omentum, and Peritoneum: No simple free fluid ascites. No pneumoperitoneum. No hemoperitoneum. No mesenteric hematoma identified. No organized fluid collection. Pelvic Organs: Normal. Lymph Nodes: No abdominal, pelvic, inguinal lymphadenopathy. Vasculature: No abdominal aorta or iliac aneurysm. No active contrast extravasation or pseudoaneurysm. Musculoskeletal: No significant soft tissue hematoma. No acute pelvic  fracture. No spinal fracture. Diffuse appendicular and axial skeleton lytic lesions. IMPRESSION: 1.  No acute traumatic injury to the abdomen, or pelvis. 2. No acute fracture or traumatic malalignment of the lumbar spine. 3. Several vague indeterminate hypodense right hepatic lobe lesions measuring 0.9, 1.4, 0.7, 0.5 cm. Findings could represent metastases. 4. 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 5. An indeterminate 1.1 cm vague slightly wedge-shaped hypodensity along the left superior renal pole. Finding could represent an underlying poorly defined lesion, small infarction, less likely focal pyelonephritis. 6. Please see separately dictated CT angiography chest 11/06/2021. Electronically Signed   By: Iven Finn M.D.   On: 11/06/2021 15:15   PERIPHERAL VASCULAR CATHETERIZATION  Result Date: 11/08/2021 See surgical note for result.  US  BIOPSY (LIVER)  Result Date: 11/11/2021 INDICATION: Liver lesion EXAM: Ultrasound-guided core needle biopsy of liver lesion in the right hepatic lobe MEDICATIONS: None. ANESTHESIA/SEDATION: Moderate (conscious) sedation was employed during this procedure. A total of Versed 1 mg and Fentanyl 50 mcg was administered intravenously by the radiology nurse. Total intra-service moderate Sedation Time: 10 minutes. The patient's level of consciousness and vital signs were monitored continuously by radiology nursing throughout the procedure under my direct supervision. FLUOROSCOPY: N/a COMPLICATIONS: None immediate. PROCEDURE: Informed written consent was obtained from the patient after a thorough discussion of the procedural risks, benefits and alternatives. All questions were addressed. Maximal Sterile Barrier Technique was utilized including caps, mask, sterile gowns, sterile gloves, sterile drape, hand hygiene and skin antiseptic. A timeout was performed prior to the initiation of the procedure. The patient was placed supine on the exam table. Limited ultrasound exam of the liver was performed, which again demonstrated an approximately 1.1 cm hypoechoic nodule in the right hepatic lobe, compatible with recent CT demonstrating multiple small liver lesions. Skin entry site was marked, and the overlying skin was prepped and draped in a standard sterile fashion. Local analgesia was obtained with 1% lidocaine. Under ultrasound guidance, a 17 gauge introducer needle was advanced towards the identified lesion in the right hepatic lobe. Subsequently, core needle biopsy was performed using an 18 gauge core biopsy device x4 passes. Specimens were submitted in formalin to pathology for further handling. Limited postprocedure imaging demonstrated a small hypoechoic area deep to the lesion, suggestive of a small intraparenchymal hematoma. Review of biopsy images demonstrated likely injury to a small portal vein branch. This area was  found to be stable over a short observation period. No subcapsular hematoma or bleeding into the hepatorenal space was identified. A clean dressing was placed after manual hemostasis. The patient tolerated the procedure well. IMPRESSION: 1. Successful ultrasound-guided core needle biopsy of a 1.1 cm lesion in the right hepatic lobe. 2. Postprocedure images demonstrate a small intraparenchymal hematoma which was stable over a short observation period. Interventional radiology will follow up with this patient in the morning. IV heparin is to be held pending following day AM CBC demonstrating stable hemoglobin. Electronically Signed   By: Albin Felling M.D.   On: 11/11/2021 16:12   DG Chest Port 1 View  Result Date: 11/07/2021 CLINICAL DATA:  Status post thorenthesis EXAM: PORTABLE CHEST 1 VIEW COMPARISON:  Chest radiograph 10/22/2021. Chest CT November 06, 2021. FINDINGS: Similar versus mildly increased right midlung masslike opacity. There is some fluid tracking along the right minor fissure. No visible pneumothorax. No consolidation. Similar cardiomediastinal silhouette. IMPRESSION: 1. Similar versus mildly increased right midlung masslike opacity, further characterized on recent chest CT. There is some fluid tracking along the  right minor fissure. 2. No visible pneumothorax status post thoracentesis. Electronically Signed   By: Margaretha Sheffield M.D.   On: 11/07/2021 14:43   ECHOCARDIOGRAM COMPLETE  Result Date: 11/07/2021    ECHOCARDIOGRAM REPORT   Patient Name:   Cheryl Schroeder Date of Exam: 11/07/2021 Medical Rec #:  [id number]      Height:       65.5 in Accession #:    [id number]     Weight:       125.7 lb Date of Birth:  08-08-1956      BSA:          1.633 m Patient Age:    66 years       BP:           124/80 mmHg Patient Gender: F              HR:           78 bpm. Exam Location:  [facility] Procedure: 2D Echo, Cardiac Doppler and Color Doppler Indications:     Pulmonary Embolism I26.09  History:         Patient  has no prior history of Echocardiogram examinations.                  Pulmonary emboli, acute pulmonary embolism.  Sonographer:     Sherrie Sport Referring Phys:  [id number] Lequita Halt Diagnosing Phys: Donnelly Angelica IMPRESSIONS  1. Left ventricular ejection fraction, by estimation, is 60 to 65%. The left ventricle has normal function. The left ventricle has no regional wall motion abnormalities. Left ventricular diastolic parameters are consistent with Grade I diastolic dysfunction (impaired relaxation).  2. Right ventricular systolic function is normal. The right ventricular size is mildly enlarged. There is mildly elevated pulmonary artery systolic pressure. The estimated right ventricular systolic pressure is 16.0 mmHg.  3. Focal thickening of MV leaflet. Correlate clinically. . The mitral valve is degenerative. Mild mitral valve regurgitation. No evidence of mitral stenosis.  4. The aortic valve is normal in structure. Aortic valve regurgitation is not visualized. No aortic stenosis is present.  5. The inferior vena cava is normal in size with <50% respiratory variability, suggesting right atrial pressure of 8 mmHg. FINDINGS  Left Ventricle: Left ventricular ejection fraction, by estimation, is 60 to 65%. The left ventricle has normal function. The left ventricle has no regional wall motion abnormalities. The left ventricular internal cavity size was normal in size. There is  no left ventricular hypertrophy. Left ventricular diastolic parameters are consistent with Grade I diastolic dysfunction (impaired relaxation). Right Ventricle: The right ventricular size is mildly enlarged. No increase in right ventricular wall thickness. Right ventricular systolic function is normal. There is mildly elevated pulmonary artery systolic pressure. The tricuspid regurgitant velocity is 2.81 m/s, and with an assumed right atrial pressure of 8 mmHg, the estimated right ventricular systolic pressure is 73.7 mmHg. Left Atrium: Left  atrial size was normal in size. Right Atrium: Right atrial size was normal in size. Pericardium: There is no evidence of pericardial effusion. Mitral Valve: Focal thickening of MV leaflet. Correlate clinically. The mitral valve is degenerative in appearance. Mild mitral valve regurgitation. No evidence of mitral valve stenosis. MV peak gradient, 6.7 mmHg. The mean mitral valve gradient is 3.0 mmHg. Tricuspid Valve: The tricuspid valve is normal in structure. Tricuspid valve regurgitation is mild . No evidence of tricuspid stenosis. Aortic Valve: The aortic valve is normal in structure. Aortic valve regurgitation is not visualized. No  aortic stenosis is present. Aortic valve mean gradient measures 3.0 mmHg. Aortic valve peak gradient measures 4.8 mmHg. Aortic valve area, by VTI measures 2.53 cm. Pulmonic Valve: The pulmonic valve was normal in structure. Pulmonic valve regurgitation is not visualized. No evidence of pulmonic stenosis. Aorta: The aortic root is normal in size and structure. Venous: The inferior vena cava is normal in size with less than 50% respiratory variability, suggesting right atrial pressure of 8 mmHg. IAS/Shunts: No atrial level shunt detected by color flow Doppler.  LEFT VENTRICLE PLAX 2D LVIDd:         3.70 cm   Diastology LVIDs:         2.30 cm   LV e' medial:    5.11 cm/s LV PW:         1.40 cm   LV E/e' medial:  15.9 LV IVS:        1.10 cm   LV e' lateral:   4.35 cm/s LVOT diam:     2.00 cm   LV E/e' lateral: 18.7 LV SV:         52 LV SV Index:   32 LVOT Area:     3.14 cm  RIGHT VENTRICLE RV Basal diam:  2.90 cm RV S prime:     13.10 cm/s TAPSE (M-mode): 2.1 cm LEFT ATRIUM             Index        RIGHT ATRIUM           Index LA diam:        2.50 cm 1.53 cm/m   RA Area:     14.40 cm LA Vol (A2C):   55.7 ml 34.11 ml/m  RA Volume:   34.30 ml  21.01 ml/m LA Vol (A4C):   50.6 ml 30.99 ml/m LA Biplane Vol: 53.7 ml 32.89 ml/m  AORTIC VALVE                    PULMONIC VALVE AV Area  (Vmax):    2.69 cm     PV Vmax:        0.70 m/s AV Area (Vmean):   2.50 cm     PV Vmean:       47.133 cm/s AV Area (VTI):     2.53 cm     PV VTI:         0.128 m AV Vmax:           109.00 cm/s  PV Peak grad:   1.9 mmHg AV Vmean:          74.700 cm/s  PV Mean grad:   1.0 mmHg AV VTI:            0.206 m      RVOT Peak grad: 3 mmHg AV Peak Grad:      4.8 mmHg AV Mean Grad:      3.0 mmHg LVOT Vmax:         93.30 cm/s LVOT Vmean:        59.400 cm/s LVOT VTI:          0.166 m LVOT/AV VTI ratio: 0.81  AORTA Ao Root diam: 3.17 cm MITRAL VALVE                TRICUSPID VALVE MV Area (PHT): 3.17 cm     TR Peak grad:   31.6 mmHg MV Area VTI:   1.83 cm     TR Vmax:  281.00 cm/s MV Peak grad:  6.7 mmHg MV Mean grad:  3.0 mmHg     SHUNTS MV Vmax:       1.29 m/s     Systemic VTI:  0.17 m MV Vmean:      75.9 cm/s    Systemic Diam: 2.00 cm MV Decel Time: 239 msec     Pulmonic VTI:  0.158 m MV E velocity: 81.40 cm/s MV A velocity: 109.00 cm/s MV E/A ratio:  0.75 Donnelly Angelica Electronically signed by Donnelly Angelica Signature Date/Time: 11/07/2021/1:06:16 PM    Final    US THORACENTESIS ASP PLEURAL SPACE W/IMG GUIDE  Result Date: 11/07/2021 INDICATION: 66 year old with right lung mass and concern for metastatic disease. EXAM: ULTRASOUND GUIDED RIGHT THORACENTESIS MEDICATIONS: None. COMPLICATIONS: None immediate. PROCEDURE: An ultrasound guided thoracentesis was thoroughly discussed with the patient and questions answered. The benefits, risks, alternatives and complications were also discussed. The patient understands and wishes to proceed with the procedure. Written consent was obtained. Ultrasound was performed to localize and mark an adequate pocket of fluid in the right chest. The area was then prepped and draped in the normal sterile fashion. 1% Lidocaine was used for local anesthesia. Under ultrasound guidance a 6 Fr Safe-T-Centesis catheter was introduced. Thoracentesis was performed. The catheter was removed and a dressing  applied. FINDINGS: A total of approximately 350 mL of amber colored fluid was removed. Samples were sent to the laboratory as requested by the clinical team. IMPRESSION: Successful ultrasound guided right thoracentesis yielding 350 mL of pleural fluid. Electronically Signed   By: Markus Daft M.D.   On: 11/07/2021 15:01      ASSESSMENT/PLAN  Lung mass, hepatic, cns, skeletal involvement DIAGNOSIS:  A. PLEURAL FLUID, RIGHT; ULTRASOUND-GUIDED THORACENTESIS:  - DIAGNOSTIC OF MALIGNANCY.  - ADENOCARCINOMA, COMPATIBLE WITH LUNG ORIGIN.  Hepatic bx pending Stage 4 lung cancer oncology on the case.   Dvt/pe To be d/c'ed on xeralto  Will sign off      Thank you for allowing me to participate in the care of this patient.   Patient/Family are satisfied with care plan and all questions have been answered.  This document was prepared using Dragon voice recognition software and may include unintentional dictation errors.     Wallene Huh, M.D.  Division of [place]

## 2021-11-12 NOTE — Progress Notes (Addendum)
Progress Note   Patient: Cheryl Schroeder JSH:[id number] DOB: 1956-05-14 DOA: 11/06/2021     6 DOS: the patient was seen and examined on 11/12/2021   Brief hospital course: 66 year old woman PMH recent outpatient diagnosis DVT treated with apixaban, presented with chest pain and frequent falls.  CT revealed acute saddle embolism and multiple PE as well as right middle lung mass and skeletal lesions concerning for malignancy.  Seen by pulmonology with recommendation for malignancy work-up. S/p right-sided thoracentesis. Seen by vascular surgery and underwent left pulmonary mechanical thrombectomy as well as placement of IVC.  Brain MRI concerning for metastatic disease as well as subacute infarcts.  Neurology consulted.  Awaiting cytology from thoracentesis.  Given need for anticoagulation, obtained liver biopsy prior to transition to oral anticoagulation.    Assessment and Plan: * Acute pulmonary embolism ([place]) -- Asymptomatic, no hypoxia, hemodynamics stable.  Although troponins were elevated consistent with demand ischemia, 2D echocardiogram reassuring.  Status post thrombectomy -- Presumed secondary to acquired thrombophilia from presumed malignancy Plan: --resume anticoagulation this morning with Xarelto, which pt will be discharged with, per oncology --monitor today to ensure no more bleeding from liver biopsy site   Liver hematoma --developed during liver biopsy on 2/6 --resume anticoagulation this morning with Xarelto --monitor bleeding and Hgb while on anticoagulation.   CVA (cerebral vascular accident) ([place]) Acute and subacute embolic infarcts 2/2 hypercoag state 2/2 underlying malignancy --stroke workup completed --cont anticogulation  Brain mass -- Worrisome for malignancy.    Renal lesion --left superior renal pole, no signs or symptoms to suggest infection.  Renal function stable.  Follow-up as an outpatient.  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  Lifelong anticoagulation.  Demand ischemia ([place]) -- Asymptomatic.  Secondary to acute PE, echocardiogram reassuring, EKG nonacute no further evaluation suggested  Liver lesion -- Worrisome for metastatic disease --liver biopsy done  Bone lesion -- Worrisome for metastatic disease, work-up in progress  Mass of right lung -- Concerning for primary lung cancer, imaging concerning for metastatic disease to skeleton, liver, brain.   --Follow-up cytology from pleural effusion.   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 --s/p throacentesis --cytology pending   DVT prophylaxis: Heparin SQ Code Status: Full code  Family Communication: sister updated at bedside today Status is: inpatient Dispo:   The patient is from: home Anticipated d/c is to: home Anticipated d/c date is: likely tomorrow Patient currently is not medically stable to d/c due to: hematoma with liver biopsy, need to monitor overnight while on anticoagulation.  If no more bleeding Hgb stable tomorrow, then can discharge.   Subjective:  Hgb stable and unchanged this morning.  Resumed anticoagulation with Xarelto this morning.   Pt reported feeling unwell this morning, and wasn't able to get out of bed.  Poor appetite today.  Complained of headache this morning.   Physical Exam: Vitals:   11/12/21 0748 11/12/21 1213 11/12/21 1524 11/12/21 1600  BP: 113/66 121/72 106/70   Pulse: 82 94 (!) 102   Resp: 16 18 18 16   Temp: 97.7 F (36.5 C) 98.2 F (36.8 C) 98.9 F (37.2 C)   TempSrc: Oral Oral Oral   SpO2: 95% 94% 92%   Weight:      Height:  Constitutional: NAD, AAOx3 HEENT: conjunctivae and lids normal, EOMI CV: No  cyanosis.   RESP: normal respiratory effort, on RA GI: liver biopsy site clean and dry. SKIN: warm, dry Neuro: II - XII grossly intact.   Psych: Normal mood and affect.  Appropriate judgement and reason   Author: Enzo Bi, MD 11/12/2021 7:35 PM  For on call review [email or web address].

## 2021-11-12 NOTE — Assessment & Plan Note (Signed)
--   Worrisome for malignancy.

## 2021-11-12 NOTE — Progress Notes (Signed)
Referring Physician(s): Dr. Sarajane Jews   Supervising Physician: Daryll Brod  Patient Status:  [facility] - In-pt  Chief Complaint: Right lung mass with liver metastases s/p liver lesion biopsy 11/11/21 with small post-procedure intraparenchymal hematoma   Subjective: Patient in bed awake/alert with a female visitor at the bedside. She complains of a headache that she thinks is from sinus issues and she also has RUQ tenderness at the liver biopsy site.   Allergies: Sulfa antibiotics  Medications: Prior to Admission medications   Medication Sig Start Date End Date Taking? Authorizing Provider  ALPRAZolam Duanne Moron) 0.5 MG tablet Take 0.5-1 mg by mouth at bedtime as needed. 10/25/21  Yes [provider]  ELIQUIS 5 MG TABS tablet Take 5 mg by mouth 2 (two) times daily. 10/09/21  Yes [provider]  FLUoxetine (PROZAC) 40 MG capsule Take 40 mg by mouth daily.   Yes [provider]  gabapentin (NEURONTIN) 300 MG capsule Take 300 mg by mouth 3 (three) times daily. Three pills at night as needed   Yes [provider]  HYDROcodone-acetaminophen (NORCO/VICODIN) 5-325 MG per tablet Take 1 tablet by mouth as needed for moderate pain.   Yes [provider]  lamoTRIgine (LAMICTAL) 200 MG tablet Take 200 mg by mouth daily. 04/27/14  Yes [provider]  omeprazole (PRILOSEC) 40 MG capsule Take 40 mg by mouth daily.   Yes [provider]  pravastatin (PRAVACHOL) 40 MG tablet Take 40 mg by mouth daily.   Yes [provider]  acetaminophen (TYLENOL) 500 MG tablet Take 500-1,000 mg by mouth as needed.    [provider]  amoxicillin-clavulanate (AUGMENTIN) 875-125 MG tablet SMARTSIG:1 Tablet(s) By Mouth Every 12 Hours Patient not taking: Reported on 11/06/2021 10/22/21   [provider]  Calcium Carbonate-Vitamin D (CALCIUM + D PO) Take by mouth daily. Patient not taking: Reported on 11/06/2021    [provider]   Fluocinolone Acetonide Body 0.01 % OIL Apply 1 application topically daily. Daily for 2 weeks then decrease to 5 times per week as needed. 08/06/21 02/02/22  Ralene Bathe, MD  fluticasone (FLONASE) 50 MCG/ACT nasal spray Place 1 spray into both nostrils daily.    [provider]     Vital Signs: BP 121/72 (BP Location: Right Arm)    Pulse 94    Temp 98.2 F (36.8 C) (Oral)    Resp 18    Ht 5' 5.51" (1.664 m)    Wt 123 lb 7.3 oz (56 kg)    SpO2 94%    BMI 20.22 kg/m   Physical Exam Constitutional:      General: She is not in acute distress. Pulmonary:     Effort: Pulmonary effort is normal.  Abdominal:     Palpations: Abdomen is soft.     Tenderness: There is abdominal tenderness.     Comments: 4/10 tenderness to palpation at the biopsy site. Bandaid intact, clean and dry with just a small spot of blood shadowing through the bandaid.   Skin:    General: Skin is warm and dry.  Neurological:     Mental Status: She is alert and oriented to person, place, and time.    Imaging: CT ANGIO HEAD NECK W WO CM  Result Date: 11/09/2021 CLINICAL DATA:  Stroke suspected EXAM: CT ANGIOGRAPHY HEAD AND NECK TECHNIQUE: Multidetector CT imaging of the head and neck was performed using the standard protocol during bolus administration of intravenous contrast. Multiplanar CT image reconstructions and MIPs  were obtained to evaluate the vascular anatomy. Carotid stenosis measurements (when applicable) are obtained utilizing NASCET criteria, using the distal internal carotid diameter as the denominator. RADIATION DOSE REDUCTION: This exam was performed according to the departmental dose-optimization program which includes automated exposure control, adjustment of the mA and/or kV according to patient size and/or use of iterative reconstruction technique. CONTRAST:  62mL OMNIPAQUE IOHEXOL 350 MG/ML SOLN COMPARISON:  No prior CT or CTA clinical correlation is made with MRI 11/09/2021; FINDINGS: CT HEAD  FINDINGS Brain: No evidence of acute cortical infarction, hemorrhage, cerebral edema, mass, mass effect, or midline shift. No hydrocephalus or extra-axial fluid collection. Vascular: No hyperdense vessel. Skull: Normal. Negative for fracture or focal lesion. Sinuses/Orbits: No acute finding. Other: Fluid in the right mastoid air cells. CTA NECK FINDINGS Aortic arch: Standard branching. Imaged portion shows no evidence of aneurysm or dissection. No significant stenosis of the major arch vessel origins. Right carotid system: No evidence of dissection, stenosis (50% or greater) or occlusion. Left carotid system: No evidence of dissection, stenosis (50% or greater) or occlusion. Vertebral arteries: The left vertebral artery is patent from its origin to the skull base. The right vertebral artery is diminutive, likely congenitally given the size of the vertebral artery foramen, and is patent to the skull base. No evidence of dissection, occlusion, or hemodynamically significant stenosis (greater than 50%). Skeleton: Numerous lytic lesions in the cervical and imaged thoracic spine. Possible mild vertebral body height loss at C6. Other neck: Hypoenhancing lesion in the left thyroid lobe measures up to 2.2 cm. Upper chest: Redemonstrated saddle embolus in the left main pulmonary artery, extending into the branch vessels, which was better evaluated on the 11/06/2021 CTA. Slight decrease in the size of a right pleural effusion, with new trace left pleural effusion. New ground-glass and patchy opacities in the right upper lobe (series 9, images 234, 237, 275, and 305) and superior right lower lobe (series 9, image 310), with redemonstrated opacities in the left upper lobe (series 9, image 232) and medial right upper lobe (series 9, image 295). Partially imaged inferior right upper lobe mass. Review of the MIP images confirms the above findings CTA HEAD FINDINGS Anterior circulation: Both internal carotid arteries are patent to  the termini, without significant stenosis. A1 segments patent. Normal anterior communicating artery. Anterior cerebral arteries are patent to their distal aspects. No M1 stenosis or occlusion. Normal MCA bifurcations. Distal MCA branches perfused and symmetric. Posterior circulation: The right vertebral artery is quite diminutive in the proximal V4 segment and further decreased in caliber after the takeoff of the PICA (series 9, image 129); while this may represent a degree of stenosis, it could also be due to normal branching of an already diminutive vessel. The dominant left vertebral artery is patent to the vertebrobasilar junction. Posterior inferior cerebral arteries patent bilaterally. Basilar patent to its distal aspect. Superior cerebellar arteries patent bilaterally. Patent right posterior communicating artery, with a hypoplastic right P1 and near fetal origin of the right PCA. The left P1 is patent, with a small patent left posterior communicating artery. PCAs perfused to their distal aspects without stenosis. Venous sinuses: As permitted by contrast timing, patent. Anatomic variants: Near fetal origin of the right PCA. Review of the MIP images confirms the above findings IMPRESSION: 1. Diminutive extracranial right vertebral artery, likely congenital, with further diminution of the intracranial right V4 after the takeoff of the right PICA, which may reflect a degree of stenosis and/or normal post-branching caliber of an already  diminutive vessel. The left vertebral artery is dominant and patent. No intracranial large vessel occlusion or other hemodynamically significant stenosis. 2.  No hemodynamically significant stenosis in the neck. 3. Partially imaged right mid lung mass, with increased ground-glass opacities in the imaged right upper and lower lobes, concerning for worsening multifocal infection. 4. Slightly decreased right pleural effusion, with new trace left pleural effusion. 5. Lytic lesions in  the cervical spine, with possible mild vertebral body height loss in C6. 6. Hypoenhancing lesion in the left thyroid lobe, which measures up to 2.2 cm. If this has not previously been evaluated, a non-emergent ultrasound of the thyroid is recommended. (Reference: J Am Coll Radiol. 2015 Feb;12(2): 143-50) 7. Electronically Signed   By: Merilyn Baba M.D.   On: 11/09/2021 21:09   MR BRAIN W WO CONTRAST  Result Date: 11/09/2021 CLINICAL DATA:  Weight loss, brain metastases suspected EXAM: MRI HEAD WITHOUT AND WITH CONTRAST TECHNIQUE: Multiplanar, multiecho pulse sequences of the brain and surrounding structures were obtained without and with intravenous contrast. CONTRAST:  25mL GADAVIST GADOBUTROL 1 MMOL/ML IV SOLN COMPARISON:  None. FINDINGS: Brain: Round, peripherally enhancing lesion in the left inferior frontal lobe, which measures up to 9 x 9 mm (series 18, image 17), associated hemosiderin deposition, concerning for a hemorrhagic metastasis. This area is associated with hemosiderin deposition. Peripherally enhancing lesion in the left frontal lobe measures up to 4 mm (series 18, image 127). Multiple additional enhancing foci are also seen, some of which correlate with areas of restricted diffusion, described below, while some appear more masslike. These enhancing foci are seen on series 18 on the following images: 136, 120, 117, 115 113, 111, 108, 106, 104, 95, 94, 93, 92, 90, 87, 84, 80, 78, 77, 76, 70, and 59. Scattered foci of increased signal on diffusion-weighted imaging throughout the bilateral cerebral and cerebellar hemispheres, some of which demonstrate ADC correlates; for example several cortical foci and white matter areas in the posterior right frontal lobe (series 5, image 32-34), in the left anterior frontal lobe (series 5, image 27-28), areas along the right insula (series 5, image 27), left caudate (series 5, image 28), and bilateral cerebellar hemispheres (series 5, image 12-14). Some foci of  increased signal on diffusion-weighted imaging do not demonstrate ADC correlates, and may reflect subacute infarcts with normalization of the ADC. However, some of these correlate with foci of enhancement such as on series 18, image 104. No acute hemorrhage or midline shift. No hydrocephalus or extra-axial collection Vascular: Normal flow voids. Skull and upper cervical spine: Abnormal enhancement in the arch (series 18, image 15) and bilateral lateral masses of C1 (series 18, image 8 and 10), as well as the anterior aspect of C2 (series 18, image 1). Small enhancing focus in the right occipital calvarium (series 18, image 72 Sinuses/Orbits: Negative. Other: Fluid throughout the right mastoid air cells. IMPRESSION: 1. Two round, peripherally enhancing lesions in the left frontal lobe, concerning for metastatic disease. Numerous additional punctate and linear areas of enhancement are also seen, some of which may represent additional metastatic lesions, but some of which may represent enhancing, subacute infarcts. 2. Numerous foci of restricted diffusion in the bilateral cerebral and cerebellar hemispheres, some of which demonstrate ADC correlates and some of which may have normalized ADC values. These are concerning for acute and subacute infarcts, likely embolic in etiology given multiple vascular territories. 3. Abnormal enhancement in C1, C2, and the right occipital calvarium, concerning for osseous metastatic disease. These results were called  by telephone at the time of interpretation on 11/09/2021 at 3:58 am to provider [facility] , who verbally acknowledged these results. Electronically Signed   By: Merilyn Baba M.D.   On: 11/09/2021 03:59   PERIPHERAL VASCULAR CATHETERIZATION  Result Date: 11/08/2021 See surgical note for result.  US BIOPSY (LIVER)  Result Date: 11/11/2021 INDICATION: Liver lesion EXAM: Ultrasound-guided core needle biopsy of liver lesion in the right hepatic lobe MEDICATIONS: None.  ANESTHESIA/SEDATION: Moderate (conscious) sedation was employed during this procedure. A total of Versed 1 mg and Fentanyl 50 mcg was administered intravenously by the radiology nurse. Total intra-service moderate Sedation Time: 10 minutes. The patient's level of consciousness and vital signs were monitored continuously by radiology nursing throughout the procedure under my direct supervision. FLUOROSCOPY: N/a COMPLICATIONS: None immediate. PROCEDURE: Informed written consent was obtained from the patient after a thorough discussion of the procedural risks, benefits and alternatives. All questions were addressed. Maximal Sterile Barrier Technique was utilized including caps, mask, sterile gowns, sterile gloves, sterile drape, hand hygiene and skin antiseptic. A timeout was performed prior to the initiation of the procedure. The patient was placed supine on the exam table. Limited ultrasound exam of the liver was performed, which again demonstrated an approximately 1.1 cm hypoechoic nodule in the right hepatic lobe, compatible with recent CT demonstrating multiple small liver lesions. Skin entry site was marked, and the overlying skin was prepped and draped in a standard sterile fashion. Local analgesia was obtained with 1% lidocaine. Under ultrasound guidance, a 17 gauge introducer needle was advanced towards the identified lesion in the right hepatic lobe. Subsequently, core needle biopsy was performed using an 18 gauge core biopsy device x4 passes. Specimens were submitted in formalin to pathology for further handling. Limited postprocedure imaging demonstrated a small hypoechoic area deep to the lesion, suggestive of a small intraparenchymal hematoma. Review of biopsy images demonstrated likely injury to a small portal vein branch. This area was found to be stable over a short observation period. No subcapsular hematoma or bleeding into the hepatorenal space was identified. A clean dressing was placed after  manual hemostasis. The patient tolerated the procedure well. IMPRESSION: 1. Successful ultrasound-guided core needle biopsy of a 1.1 cm lesion in the right hepatic lobe. 2. Postprocedure images demonstrate a small intraparenchymal hematoma which was stable over a short observation period. Interventional radiology will follow up with this patient in the morning. IV heparin is to be held pending following day AM CBC demonstrating stable hemoglobin. Electronically Signed   By: Albin Felling M.D.   On: 11/11/2021 16:12    Labs:  CBC: Recent Labs    11/09/21 0219 11/10/21 0441 11/11/21 0551 11/12/21 0552  WBC 9.4 10.1 8.2 15.1*  HGB 10.1* 10.5* 9.3* 10.4*  HCT 32.1* 32.9* 29.2* 33.2*  PLT 229 249 235 143*    COAGS: Recent Labs    11/06/21 1257 11/06/21 2301 11/07/21 0412 11/07/21 0955 11/08/21 0556  INR 1.3*  --   --   --   --   APTT 32 60* 67* 68* 67*    BMP: Recent Labs    11/08/21 0556 11/09/21 0219 11/10/21 0441 11/11/21 0551  NA 138 136 135 140  K 4.2 5.0 4.8 4.1  CL 104 102 102 107  CO2 27 28 28 30   GLUCOSE 92 112* 102* 103*  BUN 12 17 14 19   CALCIUM 9.4 9.0 9.3 9.0  CREATININE 0.89 0.95 0.88 0.82  GFRNONAA >60 >60 >60 >60    LIVER  FUNCTION TESTS: Recent Labs    11/06/21 1257  BILITOT 0.8  AST 34  ALT 20  ALKPHOS 196*  PROT 8.1  ALBUMIN 4.0    Assessment and Plan:  Right lung mass with liver metastases s/p liver lesion biopsy 11/11/21 with small post-procedure intraparenchymal hematoma   Hemoglobin level up to 10.4 this morning. Ok to resume heparin. Patient advised to let her nurse/doctor know if the RUQ pain gets worse. She is currently rating her pain a 4/10 at the biopsy site.   Please call IR with any questions.   Electronically Signed: Soyla Dryer, AGACNP-BC [phone] 11/12/2021, 2:30 PM   I spent a total of 15 Minutes at the the patient's bedside AND on the patient's hospital floor or unit, greater than 50% of which was  counseling/coordinating care for liver lesion biopsy

## 2021-11-12 NOTE — Progress Notes (Signed)
Occupational Therapy Treatment Patient Details Name: Cheryl Schroeder MRN: [id number] DOB: December 18, 1955 Today's Date: 11/12/2021   History of present illness Patient is a 66 year old female who reports to [facility] ED with complaints of continuous epigastric pain for the past 3 weeks. Patient admitted for acute pulmonary embolism. Left pulmonary thrombectomy for PE/DVT completed on 11/08/21. Patient has a PMH (+) for bipolar disorder, fibromyalgia, abdominal hysterectomy.   OT comments  Pt seen for OT tx. Pt motivated to get up and attempt to walk in room. Pt endorsing not feeling well prior to attempt, reporting nausea and lightheadedness. BP in supine 97/51 HR 102. Supervision + time/effort for sup>sit EOB. Pt tolerated sitting <97min before she required to return to bed 2/2 increasing lightheadedness and nausea. BP in sitting 118/68 HR 114. RN notified. Emotional support and active listening provided to pt as she expressed frustration and disappointment with being unable to tolerate more. Pt continues to benefit from skilled OT Services to maximize safety and return to PLOF.    Recommendations for follow up therapy are one component of a multi-disciplinary discharge planning process, led by the attending physician.  Recommendations may be updated based on patient status, additional functional criteria and insurance authorization.    Follow Up Recommendations  Home health OT    Assistance Recommended at Discharge Intermittent Supervision/Assistance  Patient can return home with the following  A little help with bathing/dressing/bathroom;Assist for transportation;A little help with walking and/or transfers   Equipment Recommendations  Tub/shower seat    Recommendations for Other Services      Precautions / Restrictions Precautions Precautions: Fall Restrictions Weight Bearing Restrictions: No       Mobility Bed Mobility Overal bed mobility: Needs Assistance Bed Mobility: Supine to Sit, Sit to  Supine     Supine to sit: Supervision Sit to supine: Supervision   General bed mobility comments: increased time/effort, increased nausea/lightheadedness with sitting    Transfers                   General transfer comment: deferred 2/2 nausea, lightheadedness     Balance Overall balance assessment: Needs assistance Sitting-balance support: Single extremity supported, Feet supported, Bilateral upper extremity supported Sitting balance-Leahy Scale: Fair Sitting balance - Comments: limited by nausea/lightheadedness                                   ADL either performed or assessed with clinical judgement   ADL                                              Extremity/Trunk Assessment              Vision       Perception     Praxis      Cognition Arousal/Alertness: Awake/alert Behavior During Therapy: WFL for tasks assessed/performed Overall Cognitive Status: Within Functional Limits for tasks assessed                                          Exercises      Shoulder Instructions       General Comments      Pertinent Vitals/ Pain  Pain Assessment Pain Assessment: 0-10 Pain Score: 8  Pain Location: sinus/forehead pressure Pain Descriptors / Indicators: Aching, Headache, Pressure Pain Intervention(s): Limited activity within patient's tolerance, Monitored during session, Repositioned, Patient requesting pain meds-RN notified  Home Living                                          Prior Functioning/Environment              Frequency  Min 2X/week        Progress Toward Goals  OT Goals(current goals can now be found in the care plan section)  Progress towards OT goals: OT to reassess next treatment  Acute Rehab OT Goals Patient Stated Goal: to go home OT Goal Formulation: With patient Time For Goal Achievement: 11/24/21 Potential to Achieve Goals: Good  Plan  Discharge plan remains appropriate;Frequency remains appropriate    Co-evaluation                 AM-PAC OT "6 Clicks" Daily Activity     Outcome Measure   Help from another person eating meals?: None Help from another person taking care of personal grooming?: None Help from another person toileting, which includes using toliet, bedpan, or urinal?: None Help from another person bathing (including washing, rinsing, drying)?: A Little Help from another person to put on and taking off regular upper body clothing?: None Help from another person to put on and taking off regular lower body clothing?: A Little 6 Click Score: 22    End of Session Equipment Utilized During Treatment: Rolling walker (2 wheels)  OT Visit Diagnosis: Unsteadiness on feet (R26.81);Muscle weakness (generalized) (M62.81)   Activity Tolerance Patient tolerated treatment well   Patient Left in bed;with call bell/phone within reach;with bed alarm set;with family/visitor present   Nurse Communication Patient requests pain meds;Other (comment) (nausea)        Time: 2355-7322 OT Time Calculation (min): 21 min  Charges: OT General Charges $OT Visit: 1 Visit OT Treatments $Therapeutic Activity: 8-22 mins  Ardeth Perfect., MPH, MS, OTR/L ascom [phone] 11/12/21, 3:45 PM

## 2021-11-12 NOTE — Consult Note (Signed)
ANTICOAGULATION CONSULT NOTE   Pharmacy Consult for Heparin dosing Indication: pulmonary embolus  Allergies  Allergen Reactions   Sulfa Antibiotics Nausea Only and Nausea And Vomiting    Patient Measurements: Height: 5' 5.51" (166.4 cm) Weight: 56 kg (123 lb 7.3 oz) IBW/kg (Calculated) : 58.18 Heparin Dosing Weight: 52.2kg  Vital Signs: Temp: 98.2 F (36.8 C) (02/07 1213) Temp Source: Oral (02/07 1213) BP: 121/72 (02/07 1213) Pulse Rate: 94 (02/07 1213)  Labs: Recent Labs    11/10/21 0441 11/11/21 0551 11/12/21 0552  HGB 10.5* 9.3* 10.4*  HCT 32.9* 29.2* 33.2*  PLT 249 235 143*  HEPARINUNFRC 0.51 0.39 0.31  CREATININE 0.88 0.82  --      Estimated Creatinine Clearance: 59.7 mL/min (by C-G formula based on SCr of 0.82 mg/dL).   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   Acute pulmonary embolism ([place]) 11/06/2021   Bipolar 2 disorder (H[facility])    Body mass index (BMI) 19.9 or less, adult 11/09/2021   CVA (cerebral vascular accident) ([place]) 11/09/2021   DVT of leg (deep venous thrombosis) ([place]) 11/09/2021   Dysplastic nevus    R calf, txted in past, pt states ~2002   Fibromyalgia    Psoriasis    Thyroid nodule 11/09/2021    Medications:  Apixaban 5mg  BID (for DVT lower extremity)  Assessment: patient is a 66 year old female recently diagnosed with a DVT about 3 weeks ago who is on Eliquis who presents with some left upper abdominal pain and back pain for several weeks.CT positive for pulmonary emboli.    Goal of Therapy:  Heparin level 0.3-0.7 units/ml aPTT 66-102 seconds Monitor platelets by anticoagulation protocol: Yes   Plan:  Transitioning patient from Heparin to Rivaroxaban Rivaroxaban 15mg  PO BID x 21 days, then 20mg  PO daily    Will continue to monitor H&H and CBC daily  Pearla Dubonnet, PharmD Clinical Pharmacist 11/12/2021 1:51 PM

## 2021-11-12 NOTE — Consult Note (Signed)
ANTICOAGULATION CONSULT NOTE   Pharmacy Consult for Heparin dosing Indication: pulmonary embolus  Allergies  Allergen Reactions   Sulfa Antibiotics Nausea Only and Nausea And Vomiting    Patient Measurements: Height: 5' 5.51" (166.4 cm) Weight: 56 kg (123 lb 7.3 oz) IBW/kg (Calculated) : 58.18 Heparin Dosing Weight: 52.2kg  Vital Signs: Temp: 98.8 F (37.1 C) (02/07 0613) Temp Source: Oral (02/07 0613) BP: 117/81 (02/07 9295) Pulse Rate: 108 (02/07 0613)  Labs: Recent Labs    11/10/21 0441 11/11/21 0551 11/12/21 0552  HGB 10.5* 9.3* 10.4*  HCT 32.9* 29.2* 33.2*  PLT 249 235 143*  HEPARINUNFRC 0.51 0.39 0.31  CREATININE 0.88 0.82  --      Estimated Creatinine Clearance: 59.7 mL/min (by C-G formula based on SCr of 0.82 mg/dL).   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   Acute pulmonary embolism ([place]) 11/06/2021   Bipolar 2 disorder (H[facility])    Body mass index (BMI) 19.9 or less, adult 11/09/2021   CVA (cerebral vascular accident) ([place]) 11/09/2021   DVT of leg (deep venous thrombosis) ([place]) 11/09/2021   Dysplastic nevus    R calf, txted in past, pt states ~2002   Fibromyalgia    Psoriasis    Thyroid nodule 11/09/2021    Medications:  Apixaban 5mg  BID (for DVT lower extremity)  Assessment: patient is a 66 year old female recently diagnosed with a DVT about 3 weeks ago who is on Eliquis who presents with some left upper abdominal pain and back pain for several weeks.CT positive for pulmonary emboli.  2/3 0656 HL 0.60, aPTT 67  -- Therapuetic  Goal of Therapy:  Heparin level 0.3-0.7 units/ml aPTT 66-102 seconds Monitor platelets by anticoagulation protocol: Yes   Plan:  2/7:  HL @ 0551 = 0.31, slightly therapeutic, trending down Will increase HL slightly to 1050 units/hr.   Recheck in 6 hrs to reconfirm, then daily.  CBC daily while on heparin.   Renda Rolls,  PharmD, MBA 11/12/2021 7:01 AM

## 2021-11-12 NOTE — Progress Notes (Signed)
Per Dr. Tasia Catchings, pt needs assistance with coordinating care to schedule PET scan for lung cancer staging and follow up after discharge with med-onc, rad-onc, and palliative care. Pt needs NGS testing ordered on sample per Dr. Tasia Catchings. Order placed for PET scan to start authorization process while pt is pending hospital discharge. Will call pt with appts once scheduled.

## 2021-11-13 ENCOUNTER — Inpatient Hospital Stay: Payer: Medicare HMO

## 2021-11-13 DIAGNOSIS — I951 Orthostatic hypotension: Secondary | ICD-10-CM

## 2021-11-13 LAB — CBC
HCT: 32.1 % — ABNORMAL LOW (ref 36.0–46.0)
Hemoglobin: 10.1 g/dL — ABNORMAL LOW (ref 12.0–15.0)
MCH: 29.3 pg (ref 26.0–34.0)
MCHC: 31.5 g/dL (ref 30.0–36.0)
MCV: 93 fL (ref 80.0–100.0)
Platelets: 120 10*3/uL — ABNORMAL LOW (ref 150–400)
RBC: 3.45 MIL/uL — ABNORMAL LOW (ref 3.87–5.11)
RDW: 13.5 % (ref 11.5–15.5)
WBC: 14.6 10*3/uL — ABNORMAL HIGH (ref 4.0–10.5)
nRBC: 0 % (ref 0.0–0.2)

## 2021-11-13 LAB — BASIC METABOLIC PANEL
Anion gap: 9 (ref 5–15)
BUN: 37 mg/dL — ABNORMAL HIGH (ref 8–23)
CO2: 26 mmol/L (ref 22–32)
Calcium: 9.6 mg/dL (ref 8.9–10.3)
Chloride: 101 mmol/L (ref 98–111)
Creatinine, Ser: 1.26 mg/dL — ABNORMAL HIGH (ref 0.44–1.00)
GFR, Estimated: 47 mL/min — ABNORMAL LOW (ref >60)
Glucose, Bld: 115 mg/dL — ABNORMAL HIGH (ref 70–99)
Potassium: 5.3 mmol/L — ABNORMAL HIGH (ref 3.5–5.1)
Sodium: 136 mmol/L (ref 135–145)

## 2021-11-13 LAB — POTASSIUM: Potassium: 4.4 mmol/L (ref 3.5–5.1)

## 2021-11-13 LAB — MAGNESIUM: Magnesium: 2.4 mg/dL (ref 1.7–2.4)

## 2021-11-13 MED ORDER — PROSOURCE PLUS PO LIQD
30.0000 mL | Freq: Two times a day (BID) | ORAL | Status: DC
  Administered 2021-11-14 – 2021-11-17 (×6): 30 mL via ORAL
  Filled 2021-11-13 (×8): qty 30

## 2021-11-13 MED ORDER — LACTATED RINGERS IV SOLN: INTRAVENOUS | Status: AC

## 2021-11-13 MED ORDER — HEPARIN (PORCINE) 25000 UT/250ML-% IV SOLN
1000.0000 [IU]/h | INTRAVENOUS | Status: DC
  Administered 2021-11-14: 1000 [IU]/h via INTRAVENOUS
  Filled 2021-11-13: qty 250

## 2021-11-13 MED ORDER — SODIUM POLYSTYRENE SULFONATE 15 GM/60ML PO SUSP
30.0000 g | Freq: Once | ORAL | Status: AC
  Administered 2021-11-13: 30 g via ORAL
  Filled 2021-11-13: qty 120

## 2021-11-13 MED ORDER — GADOBUTROL 1 MMOL/ML IV SOLN
5.0000 mL | Freq: Once | INTRAVENOUS | Status: AC | PRN
  Administered 2021-11-13: 5 mL via INTRAVENOUS

## 2021-11-13 NOTE — Progress Notes (Signed)
Chaplain On-Call received a phone call at 1155 from [facility] [phone], a sister of the patient. Beth requested assistance with Advance Directives for the patient.   Chaplain went to the Unit and received medical update from [place], who stated that the patient is alert and oriented, and aware of the purpose of Advance Directives.   Beth stated that she is assisting the patient with understanding the Advance Directives documents that the patient wants to complete.  This Chaplain met the patient, and provided additional education and explained the hospital process for completion of the AD documents.  Due to Shift Change, this Chaplain referred the process to the next On-Call Chaplain, Amy Burris, for follow up.  Chaplain Pollyann Samples M.Div., [facility]

## 2021-11-13 NOTE — Progress Notes (Signed)
PT Cancellation Note  Patient Details Name: Cheryl Schroeder MRN: [id number] DOB: 07/05/1956   Cancelled Treatment:    Reason Eval/Treat Not Completed: Medical issues which prohibited therapy Chart reviewed, and spoke with OT, RN, and MD. Due to patient becoming orthostatic with OT, and spoke with MD about patient getting fluids, will hold on PT at this time, and will re-attempt at a later time/date as available and patient medically appropriate for PT. Thank you!   Iva Boop, PT  11/13/21. 9:45 AM

## 2021-11-13 NOTE — Care Management Important Message (Signed)
Important Message  Patient Details  Name: Cheryl Schroeder MRN: [id number] Date of Birth: July 18, 1956   Medicare Important Message Given:  Yes     Dannette Barbara 11/13/2021, 12:39 PM

## 2021-11-13 NOTE — Evaluation (Addendum)
Occupational Therapy Re-Evaluation Patient Details Name: Cheryl Schroeder MRN: [id number] DOB: [birth year] Today's Date: 11/13/2021   History of Present Illness Patient is a 66 year old female who reports to [facility] ED with complaints of continuous epigastric pain for the past 3 weeks. Patient admitted for acute pulmonary embolism. Left pulmonary thrombectomy for PE/DVT completed on 11/08/21. Patient has a PMH (+) for bipolar disorder, fibromyalgia, abdominal hysterectomy.   Clinical Impression   Pt seen for OT re-evaluation this date d/t significant increase in weakness versus initial OT evaluation. Pt presents this date with decreased strength and fxl activity tolerance. She requires MIN A to get to EOB sitting and MIN/MOD A for attempted standing that is ill-tolerated d/t + orthostatics (see below for more detail, RN/MD updated). She is unable to tolerate simulating basic home ADL transfers such as a BSC transfer. In addition, R hand weakness noted and pt is unable to hold a cup with R hand, requires MIN  to SETUP and use of both hands to take sips while seated in high-fowler's position in bed. OT communicates with pt and her sister, Eustaquio Maize, who is present throughout, re: current fall concerns, weakness concerns, decreased fxl use of R hand, etc. With current home situation-pt's sisters are stepping up to care for pt's mother (pt was previously the full time caregiver for the mother), they do not feel they can provide adequate care at home for pt as well. Because of new R hand weakness (communicated this to MD as well, L frontal lesion noted on MRI completed 2/4), and poor tolerance of basic mobility, anticipate pt will require STR f/u upon d/c from acute setting.    Recommendations for follow up therapy are one component of a multi-disciplinary discharge planning process, led by the attending physician.  Recommendations may be updated based on patient status, additional functional criteria and insurance  authorization.   Follow Up Recommendations  Skilled nursing-short term rehab (<3 hours/day)    Assistance Recommended at Discharge Intermittent Supervision/Assistance  Patient can return home with the following A little help with bathing/dressing/bathroom;Assist for transportation;A little help with walking and/or transfers;Assistance with cooking/housework;Assistance with feeding;Direct supervision/assist for medications management;Direct supervision/assist for financial management;Help with stairs or ramp for entrance    Functional Status Assessment  Patient has had a recent decline in their functional status and demonstrates the ability to make significant improvements in function in a reasonable and predictable amount of time.  Equipment Recommendations  Other (comment) (defer to next level of care)    Recommendations for Other Services       Precautions / Restrictions Precautions Precautions: Fall Restrictions Weight Bearing Restrictions: No      Mobility Bed Mobility Overal bed mobility: Needs Assistance Bed Mobility: Supine to Sit, Sit to Supine     Supine to sit: Min assist, HOB elevated Sit to supine: Min assist   General bed mobility comments: MIN A to assist with trunk for sup to sit and MIN A to assist with LEs back to bed    Transfers Overall transfer level: Needs assistance Equipment used: 1 person hand held assist Transfers: Sit to/from Stand Sit to Stand: Min assist           General transfer comment: used counter-top for UE Support, + orthostatic hypotension, RN and MD notified.      Balance Overall balance assessment: Needs assistance Sitting-balance support: Single extremity supported, Feet supported, Bilateral upper extremity supported Sitting balance-Leahy Scale: Fair Sitting balance - Comments: limited by nausea/lightheadedness  Standing balance support: Bilateral upper extremity supported, During functional activity Standing  balance-Leahy Scale: Poor Standing balance comment: shaky/weak, unable to sustain for long d/t orthostatics                           ADL either performed or assessed with clinical judgement   ADL Overall ADL's : Needs assistance/impaired Eating/Feeding: Minimal assistance;Sitting Eating/Feeding Details (indicate cue type and reason): MIN A initially, progresses to SETUP with use of both hands to drink d/t R hand weakness                 Lower Body Dressing: Maximal assistance;Sitting/lateral leans Lower Body Dressing Details (indicate cue type and reason): socks Toilet Transfer: Minimal assistance;Moderate assistance Toilet Transfer Details (indicate cue type and reason): unable to complete simulation d/t significant symptomatic orthostatic hypotension.         Functional mobility during ADLs:  (unable to complete fxl mobility d/t orthostatics)       Vision Patient Visual Report: No change from baseline Additional Comments: some instances of decreased eye opening.     Perception     Praxis      Pertinent Vitals/Pain Pain Assessment Pain Assessment: 0-10 Pain Score: 6  Pain Location: general and HA Pain Descriptors / Indicators: Aching, Headache, Pressure Pain Intervention(s): Limited activity within patient's tolerance, Monitored during session, Repositioned, RN gave pain meds during session     Hand Dominance Left   Extremity/Trunk Assessment Upper Extremity Assessment Upper Extremity Assessment: Generalized weakness;RUE deficits/detail RUE Deficits / Details: R hand with limitd ability to extend digits and 3-/5 grip strength, unable to hold any ADL items in R hand. RUE Coordination: decreased fine motor   Lower Extremity Assessment Lower Extremity Assessment: Generalized weakness   Cervical / Trunk Assessment Cervical / Trunk Assessment: Normal   Communication Communication Communication: No difficulties   Cognition Arousal/Alertness:  Awake/alert Behavior During Therapy: WFL for tasks assessed/performed Overall Cognitive Status: Within Functional Limits for tasks assessed                                 General Comments: A&O x 4, pleasant, but fatigued     General Comments  BP supine initially 98/79, OT encourages oral fluid intake and changes BP cuff to smaller/more appropriate size. Laying re-checked and 119/74 (86), sitting 116/79 (89), standing 80/66 (73) very symptomatic, assisted back to bed. MD/RN notified.    Exercises Other Exercises Other Exercises: OT ed with pt and family member-sister beth-re: orthostatics, safety considerations, concerns regarding current d/c plan, fall risk. Pt and family agreeable to change in recommendation to STR d/t significant decline in fxl activity tolernace and tolerance for basic ADL transfers/mobility. Pt not able to even tolerate a simulated transfer to a BSC today.   Shoulder Instructions      Home Living Family/patient expects to be discharged to:: Private residence Living Arrangements: Parent Available Help at Discharge: Family;Available PRN/intermittently (sisters live out of town and are helping care for pt's elderly mother (pt was previoulsy the primary caregiver).) Type of Home: House Home Access: Stairs to enter [employer] of Steps: 2 Entrance Stairs-Rails: Right;Left;Can reach both Home Layout: One level     Bathroom Shower/Tub: Teacher, early years/pre: Standard Bathroom Accessibility: No   Home Equipment: Conservation officer, nature (2 wheels);Rollator (4 wheels);Cane - single point;Grab bars - tub/shower      Lives With:  Family    Prior Functioning/Environment Prior Level of Function : Independent/Modified Independent             Mobility Comments: Independent w/o DME; Reports frequent falls ADLs Comments: Independent        OT Problem List: Decreased strength;Impaired balance (sitting and/or standing);Pain;Decreased  activity tolerance      OT Treatment/Interventions: Self-care/ADL training;DME and/or AE instruction;Therapeutic activities;Balance training;Therapeutic exercise;Energy conservation;Patient/family education    OT Goals(Current goals can be found in the care plan section) Acute Rehab OT Goals Patient Stated Goal: to go home OT Goal Formulation: With patient Time For Goal Achievement: 11/27/21 Potential to Achieve Goals: Good ADL Goals Pt Will Perform Lower Body Dressing: with min guard assist;sitting/lateral leans Pt/caregiver will Perform Home Exercise Program: Increased ROM;Increased strength;Right Upper extremity;With minimal assist (for self-feeding and other UB ADLs.) Additional ADL Goal #2: Pt will tolerate unsupported sitting while completing 2 ADL tasks to increase tolerance for OOB activity (w/ less than 10% verbal cues to initiate circulation-promoting techniques should she experience lightheadedness).  OT Frequency: Min 2X/week    Co-evaluation              AM-PAC OT "6 Clicks" Daily Activity     Outcome Measure Help from another person eating meals?: A Little Help from another person taking care of personal grooming?: A Little Help from another person toileting, which includes using toliet, bedpan, or urinal?: A Lot Help from another person bathing (including washing, rinsing, drying)?: A Lot Help from another person to put on and taking off regular upper body clothing?: A Little Help from another person to put on and taking off regular lower body clothing?: A Lot 6 Click Score: 15   End of Session Equipment Utilized During Treatment: Rolling walker (2 wheels) Nurse Communication: Patient requests pain meds;Other (comment) (nausea)  Activity Tolerance: Patient tolerated treatment well Patient left: in bed;with call bell/phone within reach;with bed alarm set;with family/visitor present  OT Visit Diagnosis: Unsteadiness on feet (R26.81);Muscle weakness (generalized)  (M62.81)                Time: 1638-4536 OT Time Calculation (min): 34 min Charges:  OT General Charges $OT Visit: 1 Visit OT Treatments 1 Re-Eval $Self Care/Home Management : 8-22 mins $Therapeutic Activity: 8-22 mins  Gerrianne Scale, MS, OTR/L ascom [phone] 11/13/21, 10:15 AM

## 2021-11-13 NOTE — TOC Progression Note (Signed)
Transition of Care [facility]) - Progression Note    Patient Details  Name: Cheryl Schroeder MRN: [id number] Date of Birth: 12/04/1955  Transition of Care [facility]) CM/SW [place], LCSW Phone Number: 11/13/2021, 11:50 AM  Clinical Narrative:   Continuing medical workup. CSW following.          Expected Discharge Plan and Services                                                 Social Determinants of Health (SDOH) Interventions    Readmission Risk Interventions No flowsheet data found.

## 2021-11-13 NOTE — Progress Notes (Signed)
°   11/13/21 1730  Clinical Encounter Type  Visited With Patient and family together  Visit Type Follow-up;Other (Comment) (notarize AD)   Chaplain Burris f/u with Pt and family to facilitate notarization of advanced directive docuiments. Chaplain B placed copy in Pt's chart and provided original and copies for Pt's sisters. Chaplain B spoke briefly with Pt who was very fatigued and offered her continued to support to Pt and sisters, letting them know of my availability throughout the night. Please page if needed.

## 2021-11-13 NOTE — Evaluation (Addendum)
Physical Therapy Evaluation Patient Details Name: Cheryl Schroeder MRN: [id number] DOB: 11/14/1955 Today's Date: 11/13/2021  History of Present Illness  Patient is a 66 year old female who reports to [facility] ED with complaints of continuous epigastric pain for the past 3 weeks. Patient admitted for acute pulmonary embolism. Left pulmonary thrombectomy for PE/DVT completed on 11/08/21. Patient has a PMH (+) for bipolar disorder, fibromyalgia, abdominal hysterectomy.   Clinical Impression  Patient seen for PT re-evaluation on this date d/t significant weakness compared to initial PT evaluation. Patient presents today with decreased strength and functional activity tolerance. Compared to previous evaluation, patient fatigues and becomes SOB quickly. Patient required Mod A to get supine<>sitting with R HHA and assistance at the trunk. Patient was only able to tolerate <3 minutes sitting EOB before significant reports of nausea and lightheadedness. During strength assessment patient demonstrated at least 3-3+/5 strength bilaterally (LLE>RLE). New RUE hand weakness/contracture noted (refer to OT eval for more detail). Patient unable to state how long this has been present. Sit to stand transfers and ambulated were deferred this session for safety and patient being fearful. States she will try next session.   Home set up remains the same compared to last evaluation. Per patient she was Mod I/ Independent at baseline. She has been the primary caregiver for her mother at home, however her two sisters have stepped up to care for her while she has been In the hospital. Patient would continue to benefit from skilled physical therapy in order to optimize return to PLOF. Due to patient's change in status, recommend STR following discharge from acute hospitalization.   Orthostatic VS for PT evaluation:  BP- Lying Pulse- Lying BP- Sitting Pulse- Sitting  11/12/21 1531 113/72 107 119/71 110            Recommendations  for follow up therapy are one component of a multi-disciplinary discharge planning process, led by the attending physician.  Recommendations may be updated based on patient status, additional functional criteria and insurance authorization.  Follow Up Recommendations Skilled nursing-short term rehab (<3 hours/day)    Assistance Recommended at Discharge Frequent or constant Supervision/Assistance  Patient can return home with the following  Direct supervision/assist for medications management;Assistance with cooking/housework;Assist for transportation;Help with stairs or ramp for entrance;Direct supervision/assist for financial management;Assistance with feeding;A lot of help with walking and/or transfers;A little help with walking and/or transfers;A little help with bathing/dressing/bathroom    Equipment Recommendations Other (comment) (defer to next level of care)  Recommendations for Other Services       Functional Status Assessment Patient has had a recent decline in their functional status and demonstrates the ability to make significant improvements in function in a reasonable and predictable amount of time.     Precautions / Restrictions Precautions Precautions: Fall Restrictions Weight Bearing Restrictions: No      Mobility  Bed Mobility Overal bed mobility: Needs Assistance Bed Mobility: Supine to Sit, Sit to Supine     Supine to sit: Mod assist, HOB elevated (R HHA and assistance at the trunk, patient able to move legs off bed) Sit to supine: Mod assist, HOB elevated (assistance at the trunk and legs)        Transfers Overall transfer level:  (deferred due to safety)                      Ambulation/Gait Ambulation/Gait assistance:  (deferred due to safety)  Stairs            Wheelchair Mobility    Modified Rankin (Stroke Patients Only)       Balance Overall balance assessment: Needs assistance Sitting-balance support:  Single extremity supported, Feet supported Sitting balance-Leahy Scale: Fair Sitting balance - Comments: limited by signifincant nausea/lightheadedness, patient unable to tolerate sitting EOB > minutes before requesting to be placed back into bed, patient encouraged to attempt sitting EOB again however patient reported she was to scared to attempt right and would try again next session Postural control: Posterior lean                                   Pertinent Vitals/Pain Pain Assessment Pain Assessment: 0-10 Pain Score: 10-Worst pain ever Pain Location: R side Pain Descriptors / Indicators: Aching, Constant Pain Intervention(s): Limited activity within patient's tolerance, Monitored during session, Repositioned    Home Living Family/patient expects to be discharged to:: Private residence Living Arrangements: Parent Available Help at Discharge: Family;Available PRN/intermittently (sisters live out of town and have come to help, and helping care for patient's mother (patient was current care giver)) Type of Home: House Home Access: Stairs to enter Entrance Stairs-Rails: Right;Left;Can reach both Entrance Stairs-Number of Steps: 2   Home Layout: One level Home Equipment: Conservation officer, nature (2 wheels);Rollator (4 wheels);Cane - single point;Grab bars - tub/shower      Prior Function Prior Level of Function : Independent/Modified Independent             Mobility Comments: Independent w/o DME; Reports frequent falls ADLs Comments: Independent     Hand Dominance   Dominant Hand: Left    Extremity/Trunk Assessment   Upper Extremity Assessment Upper Extremity Assessment: Defer to OT evaluation;RUE deficits/detail;LUE deficits/detail RUE Deficits / Details: R hand with limitd ability to extend digits and 3-/5 grip strength, unable to hold any ADL items in R hand. (RUE Shoulder and elbow flexion at least 3/5) LUE Deficits / Details: LUE shoulder flexion, elbow  flexion, and elbow extension 4/5 strength    Lower Extremity Assessment Lower Extremity Assessment: Generalized weakness (At least 3/5 on RLE and 3+/5 on LLE)       Communication   Communication: No difficulties  Cognition Arousal/Alertness: Awake/alert Behavior During Therapy: WFL for tasks assessed/performed Overall Cognitive Status: Within Functional Limits for tasks assessed                                 General Comments: A&O x 4, pleasant, but fatigued        General Comments General comments (skin integrity, edema, etc.): Vitals in supine: BP initially 113/72, HR 107bpm, O2 89% on room air, BP in sitting: 119/71    Exercises Other Exercises Other Exercises: Patient educated on role of PT, fall risk, safety considerations, and orthostatics   Assessment/Plan    PT Assessment Patient needs continued PT services  PT Problem List Decreased strength;Decreased balance;Decreased range of motion;Decreased mobility;Decreased activity tolerance;Decreased coordination;Pain       PT Treatment Interventions DME instruction;Functional mobility training;Balance training;Patient/family education;Gait training;Therapeutic activities;Neuromuscular re-education;Stair training;Therapeutic exercise    PT Goals (Current goals can be found in the Care Plan section)  Acute Rehab PT Goals Patient Stated Goal: to go home PT Goal Formulation: With patient Time For Goal Achievement: 11/24/21 Potential to Achieve Goals: Good    Frequency Min 2X/week  Co-evaluation               AM-PAC PT "6 Clicks" Mobility  Outcome Measure Help needed turning from your back to your side while in a flat bed without using bedrails?: A Little Help needed moving from lying on your back to sitting on the side of a flat bed without using bedrails?: A Little Help needed moving to and from a bed to a chair (including a wheelchair)?: A Little Help needed standing up from a chair using  your arms (e.g., wheelchair or bedside chair)?: A Lot Help needed to walk in hospital room?: A Lot Help needed climbing 3-5 steps with a railing? : A Lot 6 Click Score: 15    End of Session   Activity Tolerance: Patient tolerated treatment well;No increased pain;Patient limited by fatigue Patient left: in bed;with call bell/phone within reach;with bed alarm set Nurse Communication: Mobility status PT Visit Diagnosis: Muscle weakness (generalized) (M62.81);Unsteadiness on feet (R26.81);Difficulty in walking, not elsewhere classified (R26.2);Other symptoms and signs involving the nervous system (I45.809)    Time: 9833-8250 PT Time Calculation (min) (ACUTE ONLY): 18 min   Charges:   PT Evaluation $PT Eval Low Complexity: 1 Low         Iva Boop, PT  11/13/21. 2:07 PM

## 2021-11-13 NOTE — Progress Notes (Signed)
Hematology/Oncology Progress note [phone] [phone]     Patient Care Team: Lynnell Jude, MD as PCP - General (Family Medicine) Telford Nab, RN as Oncology Nurse Navigator   Name of the patient: Cheryl Schroeder  [id number]  [birth year]  Date of visit: 11/13/21   INTERVAL HISTORY-  11/09/2021, MRI brain with and without contrast showed 2 brain lesions suspicious for metastatic disease.  Numerous foci of lesions suspecting acute and subacute infarcts.  Osseous metastasis of C1 and C2.  And right occipital calvarium  11/11/2021, status post ultrasound guided liver biopsy.  Patient had a liver hematoma.  Anticoagulation is temporarily held until tomorrow. Patient was seen by neurology for stroke and completed a stroke work-up including echocardiogram, CT angiogram of the neck  11/13/2021 patient reports new onset of right upper extremity weakness since yesterday.  One of her sisters were at the bedside.  Neurology and I were notified.  Recommend MRI brain, MRI spine for further evaluation.  Patient was known to have 2 small brain metastatic lesions [ 43mm, 39mm], numerous additional punctate linear area of enhancement, may represent a metastasis or subacute infarcts.  She also had multiple acute and subacute infarct on MRI done on 11/09/2021.  No report of vasogenic edema, acute hemorrhage or midline shift on that MRI.  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 11/11/2021, liver biopsy preliminary pathology per Dr. Reuel Derby positive for adenocarcinoma.  Allergies  Allergen Reactions   Sulfa Antibiotics Nausea Only and Nausea And Vomiting    Patient Active Problem List   Diagnosis Date Noted   Orthostatic hypotension 11/13/2021   Liver hematoma 11/11/2021   Liver metastases (HCC)    Pleural effusion    Demand ischemia ([place]) 11/09/2021   Acquired thrombophilia ([place]) 11/09/2021   DVT of leg (deep venous  thrombosis) ([place]) 11/09/2021   Thyroid nodule 11/09/2021   Renal lesion 11/09/2021   Brain mass 11/09/2021   CVA (cerebral vascular accident) ([place]) 11/09/2021   Body mass index (BMI) 19.9 or less, adult 11/09/2021   Malignant pleural effusion    Mass of right lung    Bone lesion    Liver lesion    Acute pulmonary embolism ([place]) 11/06/2021   Pulmonary emboli ([place]) 11/06/2021   Plantar fasciitis 07/09/2014     Past Medical History:  Diagnosis Date   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   Acute pulmonary embolism ([place]) 11/06/2021   Bipolar 2 disorder (HCC)    Body mass index (BMI) 19.9 or less, adult 11/09/2021   CVA (cerebral vascular accident) ([place]) 11/09/2021   DVT of leg (deep venous thrombosis) ([place]) 11/09/2021   Dysplastic nevus    R calf, txted in past, pt states ~2002   Fibromyalgia    Psoriasis    Thyroid nodule 11/09/2021     Past Surgical History:  Procedure Laterality Date   ABDOMINAL HYSTERECTOMY     ADENOIDECTOMY     BREAST BIOPSY Right 1990   cyst   PULMONARY THROMBECTOMY Left 11/08/2021   Procedure: PULMONARY THROMBECTOMY;  Surgeon: Katha Cabal, MD;  Location: [facility];  Service: Cardiovascular;  Laterality: Left;   TONSILLECTOMY      Social History   Socioeconomic History   Marital status: Single    Spouse name: Not on file   Number of children: Not on file   Years of education: Not on file   Highest education level: Not on file  Occupational History   Not on file  Tobacco Use   Smoking status: Never   Smokeless tobacco: Never  Vaping Use   Vaping Use: Never used  Substance and Sexual Activity   Alcohol use: No   Drug use: No   Sexual activity: Not on file  Other Topics Concern   Not on file  Social History Narrative   Not on file   Social Determinants of Health   Financial Resource Strain: Not on file  Food Insecurity: Not on file  Transportation Needs: Not on file  Physical Activity:  Not on file  Stress: Not on file  Social Connections: Not on file  Intimate Partner Violence: Not on file     Family History  Problem Relation Age of Onset   Breast cancer Maternal Aunt 72   Lung cancer Father      Current Facility-Administered Medications:    0.9 %  sodium chloride infusion, 250 mL, Intravenous, PRN, Schnier, Dolores Lory, MD   acetaminophen (TYLENOL) tablet 650 mg, 650 mg, Oral, Q4H PRN, Schnier, Dolores Lory, MD, 650 mg at 11/06/21 2019   ALPRAZolam (XANAX) tablet 0.5-1 mg, 0.5-1 mg, Oral, QHS PRN, Schnier, Dolores Lory, MD, 1 mg at 11/09/21 0147   feeding supplement (ENSURE ENLIVE / ENSURE PLUS) liquid 237 mL, 237 mL, Oral, BID BM, Schnier, Dolores Lory, MD, 237 mL at 11/13/21 0900   fentaNYL (SUBLIMAZE) injection, , Intravenous, PRN, El-Abd, Joesph Fillers, MD, 50 mcg at 11/11/21 1515   FLUoxetine (PROZAC) capsule 40 mg, 40 mg, Oral, Daily, Schnier, Dolores Lory, MD, 40 mg at 11/13/21 0859   fluticasone (FLONASE) 50 MCG/ACT nasal spray 1 spray, 1 spray, Each Nare, Daily, Schnier, Dolores Lory, MD   gabapentin (NEURONTIN) capsule 300 mg, 300 mg, Oral, TID, Schnier, Dolores Lory, MD, 300 mg at 11/13/21 0858   HYDROcodone-acetaminophen (NORCO/VICODIN) 5-325 MG per tablet 1 tablet, 1 tablet, Oral, Q4H PRN, Schnier, Dolores Lory, MD, 1 tablet at 11/11/21 1929   HYDROmorphone (DILAUDID) injection 0.5 mg, 0.5 mg, Intravenous, Q4H PRN, Schnier, Dolores Lory, MD, 0.5 mg at 11/13/21 0857   lactated ringers infusion, , Intravenous, Continuous, Sharen Hones, MD, Last Rate: 125 mL/hr at 11/13/21 1125, New Bag at 11/13/21 1125   lamoTRIgine (LAMICTAL) tablet 200 mg, 200 mg, Oral, Daily, Schnier, Dolores Lory, MD, 200 mg at 11/13/21 0858   midazolam (VERSED) 5 MG/5ML injection, , Intravenous, PRN, El-Abd, Joesph Fillers, MD, 1 mg at 11/11/21 1515   multivitamin with minerals tablet 1 tablet, 1 tablet, Oral, Daily, Schnier, Dolores Lory, MD, 1 tablet at 11/13/21 0857   ondansetron (ZOFRAN) injection 4 mg, 4 mg, Intravenous,  Q6H PRN, Schnier, Dolores Lory, MD, 4 mg at 11/12/21 1100   oxyCODONE (Oxy IR/ROXICODONE) immediate release tablet 5 mg, 5 mg, Oral, Q4H PRN, Schnier, Dolores Lory, MD, 5 mg at 11/13/21 0600   pantoprazole (PROTONIX) EC tablet 40 mg, 40 mg, Oral, Daily, Schnier, Dolores Lory, MD, 40 mg at 11/13/21 0858   polyethylene glycol (MIRALAX / GLYCOLAX) packet 17 g, 17 g, Oral, Daily, Schnier, Dolores Lory, MD, 17 g at 11/13/21 0859   pravastatin (PRAVACHOL) tablet 40 mg, 40 mg, Oral, QHS, Schnier, Dolores Lory, MD, 40 mg at 11/12/21 2109   Rivaroxaban (XARELTO) tablet 15 mg, 15 mg, Oral, BID WC, 15 mg at 11/13/21 0859 **FOLLOWED BY** [START ON 12/03/2021] rivaroxaban (XARELTO) tablet 20 mg, 20 mg, Oral, Q supper, Nazari, Walid A, RPH   senna-docusate (Senokot-S) tablet 2 tablet, 2 tablet, Oral, BID, Schnier, Belenda Cruise  G, MD, 2 tablet at 11/13/21 0858   sodium chloride flush (NS) 0.9 % injection 3 mL, 3 mL, Intravenous, Q12H, Schnier, Dolores Lory, MD, 3 mL at 11/13/21 0904   sodium chloride flush (NS) 0.9 % injection 3 mL, 3 mL, Intravenous, PRN, Katha Cabal, MD   Physical exam:  Vitals:   11/12/21 2019 11/13/21 0837 11/13/21 0900 11/13/21 1144  BP: 115/66 98/79 119/74 109/66  Pulse: (!) 102 99 93 (!) 101  Resp: 19  17 16   Temp: 98 F (36.7 C) 98.3 F (36.8 C) 98.3 F (36.8 C) 98.6 F (37 C)  TempSrc:    Axillary  SpO2: 95% 96% 96%   Weight:      Height:       Physical Exam Constitutional:      General: She is not in acute distress.    Appearance: She is not diaphoretic.  HENT:     Head: Normocephalic and atraumatic.     Nose: Nose normal.     Mouth/Throat:     Pharynx: No oropharyngeal exudate.  Eyes:     General: No scleral icterus.    Pupils: Pupils are equal, round, and reactive to light.  Cardiovascular:     Rate and Rhythm: Normal rate and regular rhythm.     Heart sounds: No murmur heard. Pulmonary:     Effort: Pulmonary effort is normal. No respiratory distress.     Breath sounds: No  rales.  Chest:     Chest wall: No tenderness.  Abdominal:     General: There is no distension.     Palpations: Abdomen is soft.     Tenderness: There is no abdominal tenderness.  Musculoskeletal:        General: Normal range of motion.     Cervical back: Normal range of motion and neck supple.  Skin:    General: Skin is warm and dry.     Findings: No erythema.  Neurological:     Mental Status: She is alert and oriented to person, place, and time.     Cranial Nerves: No cranial nerve deficit.     Motor: No abnormal muscle tone.     Coordination: Coordination normal.     Comments: RUE strength, 3/5.    Psychiatric:        Mood and Affect: Affect normal.       CMP Latest Ref Rng & Units 11/13/2021  Glucose 70 - 99 mg/dL 115(H)  BUN 8 - 23 mg/dL 37(H)  Creatinine 0.44 - 1.00 mg/dL 1.26(H)  Sodium 135 - 145 mmol/L 136  Potassium 3.5 - 5.1 mmol/L 5.3(H)  Chloride 98 - 111 mmol/L 101  CO2 22 - 32 mmol/L 26  Calcium 8.9 - 10.3 mg/dL 9.6  Total Protein 6.5 - 8.1 g/dL -  Total Bilirubin 0.3 - 1.2 mg/dL -  Alkaline Phos 38 - 126 U/L -  AST 15 - 41 U/L -  ALT 0 - 44 U/L -   CBC Latest Ref Rng & Units 11/13/2021  WBC 4.0 - 10.5 K/uL 14.6(H)  Hemoglobin 12.0 - 15.0 g/dL 10.1(L)  Hematocrit 36.0 - 46.0 % 32.1(L)  Platelets 150 - 400 K/uL 120(L)    RADIOGRAPHIC STUDIES: I have personally reviewed the radiological images as listed and agreed with the findings in the report. CT ANGIO HEAD NECK W WO CM  Result Date: 11/09/2021 CLINICAL DATA:  Stroke suspected EXAM: CT ANGIOGRAPHY HEAD AND NECK TECHNIQUE: Multidetector CT imaging of the head and neck was  performed using the standard protocol during bolus administration of intravenous contrast. Multiplanar CT image reconstructions and MIPs were obtained to evaluate the vascular anatomy. Carotid stenosis measurements (when applicable) are obtained utilizing NASCET criteria, using the distal internal carotid diameter as the denominator.  RADIATION DOSE REDUCTION: This exam was performed according to the departmental dose-optimization program which includes automated exposure control, adjustment of the mA and/or kV according to patient size and/or use of iterative reconstruction technique. CONTRAST:  53mL OMNIPAQUE IOHEXOL 350 MG/ML SOLN COMPARISON:  No prior CT or CTA clinical correlation is made with MRI 11/09/2021; FINDINGS: CT HEAD FINDINGS Brain: No evidence of acute cortical infarction, hemorrhage, cerebral edema, mass, mass effect, or midline shift. No hydrocephalus or extra-axial fluid collection. Vascular: No hyperdense vessel. Skull: Normal. Negative for fracture or focal lesion. Sinuses/Orbits: No acute finding. Other: Fluid in the right mastoid air cells. CTA NECK FINDINGS Aortic arch: Standard branching. Imaged portion shows no evidence of aneurysm or dissection. No significant stenosis of the major arch vessel origins. Right carotid system: No evidence of dissection, stenosis (50% or greater) or occlusion. Left carotid system: No evidence of dissection, stenosis (50% or greater) or occlusion. Vertebral arteries: The left vertebral artery is patent from its origin to the skull base. The right vertebral artery is diminutive, likely congenitally given the size of the vertebral artery foramen, and is patent to the skull base. No evidence of dissection, occlusion, or hemodynamically significant stenosis (greater than 50%). Skeleton: Numerous lytic lesions in the cervical and imaged thoracic spine. Possible mild vertebral body height loss at C6. Other neck: Hypoenhancing lesion in the left thyroid lobe measures up to 2.2 cm. Upper chest: Redemonstrated saddle embolus in the left main pulmonary artery, extending into the branch vessels, which was better evaluated on the 11/06/2021 CTA. Slight decrease in the size of a right pleural effusion, with new trace left pleural effusion. New ground-glass and patchy opacities in the right upper lobe  (series 9, images 234, 237, 275, and 305) and superior right lower lobe (series 9, image 310), with redemonstrated opacities in the left upper lobe (series 9, image 232) and medial right upper lobe (series 9, image 295). Partially imaged inferior right upper lobe mass. Review of the MIP images confirms the above findings CTA HEAD FINDINGS Anterior circulation: Both internal carotid arteries are patent to the termini, without significant stenosis. A1 segments patent. Normal anterior communicating artery. Anterior cerebral arteries are patent to their distal aspects. No M1 stenosis or occlusion. Normal MCA bifurcations. Distal MCA branches perfused and symmetric. Posterior circulation: The right vertebral artery is quite diminutive in the proximal V4 segment and further decreased in caliber after the takeoff of the PICA (series 9, image 129); while this may represent a degree of stenosis, it could also be due to normal branching of an already diminutive vessel. The dominant left vertebral artery is patent to the vertebrobasilar junction. Posterior inferior cerebral arteries patent bilaterally. Basilar patent to its distal aspect. Superior cerebellar arteries patent bilaterally. Patent right posterior communicating artery, with a hypoplastic right P1 and near fetal origin of the right PCA. The left P1 is patent, with a small patent left posterior communicating artery. PCAs perfused to their distal aspects without stenosis. Venous sinuses: As permitted by contrast timing, patent. Anatomic variants: Near fetal origin of the right PCA. Review of the MIP images confirms the above findings IMPRESSION: 1. Diminutive extracranial right vertebral artery, likely congenital, with further diminution of the intracranial right V4 after the takeoff of  the right PICA, which may reflect a degree of stenosis and/or normal post-branching caliber of an already diminutive vessel. The left vertebral artery is dominant and patent. No  intracranial large vessel occlusion or other hemodynamically significant stenosis. 2.  No hemodynamically significant stenosis in the neck. 3. Partially imaged right mid lung mass, with increased ground-glass opacities in the imaged right upper and lower lobes, concerning for worsening multifocal infection. 4. Slightly decreased right pleural effusion, with new trace left pleural effusion. 5. Lytic lesions in the cervical spine, with possible mild vertebral body height loss in C6. 6. Hypoenhancing lesion in the left thyroid lobe, which measures up to 2.2 cm. If this has not previously been evaluated, a non-emergent ultrasound of the thyroid is recommended. (Reference: J Am Coll Radiol. 2015 Feb;12(2): 143-50) 7. Electronically Signed   By: Merilyn Baba M.D.   On: 11/09/2021 21:09   DG Chest 2 View  Result Date: 10/22/2021 CLINICAL DATA:  Contusion of the rib on the left side. Left rib pain. EXAM: CHEST - 2 VIEW COMPARISON:  None. FINDINGS: The heart size and mediastinal contours are within normal limits. Soft tissue density mass with area of atelectasis in the right mid lung measuring approximately 3.0 x 4.9 cm. Bibasilar atelectasis. Hyperinflated lungs. No pleural effusion or pneumothorax. The visualized skeletal structures are unremarkable. IMPRESSION: 1. Soft tissue density mass in the right mid lung measuring at least 3.0 x 4.9 cm, it may represent pneumonia, loculated pleural effusion or pulmonary mass. Further evaluation with CT examination would be helpful. Follow-up to resolution is recommended. 2. Hyperinflated lungs concerning for COPD. Bibasilar atelectasis. No pleural effusion or pneumothorax. An attempt was made to reach out to the provider Dr. Clemmie Krill without response from our office at the time of dictation. Electronically Signed   By: Keane Police D.O.   On: 10/22/2021 16:07   DG Ribs Unilateral W/Chest Left  Result Date: 11/06/2021 CLINICAL DATA:  Fall, left rib pain EXAM: LEFT RIBS AND CHEST  - 3+ VIEW COMPARISON:  Chest x-ray 10/22/2021 FINDINGS: No acute fracture identified in the left ribs. Cardiomediastinal silhouette is unchanged. Persistent ovoid masslike opacity adjacent to the fissure in the right lung measuring approximately 5 x 3.2 cm, grossly unchanged. Linear opacities in the left lower lung zone likely represent subsegmental atelectasis. Hyperinflated lungs with mildly prominent interstitial lung markings. No pleural effusion or pneumothorax visualized. IMPRESSION: 1. No left rib fractures identified. 2. Persistent and unchanged ovoid perifissural masslike opacity in the right lung. 3. COPD. Electronically Signed   By: Ofilia Neas M.D.   On: 11/06/2021 12:16   CT Angio Chest PE W and/or Wo Contrast  Result Date: 11/06/2021 CLINICAL DATA:  Chest pain, recent trauma EXAM: CT ANGIOGRAPHY CHEST WITH CONTRAST TECHNIQUE: Multidetector CT imaging of the chest was performed using the standard protocol during bolus administration of intravenous contrast. Multiplanar CT image reconstructions and MIPs were obtained to evaluate the vascular anatomy. RADIATION DOSE REDUCTION: This exam was performed according to the departmental dose-optimization program which includes automated exposure control, adjustment of the mA and/or kV according to patient size and/or use of iterative reconstruction technique. CONTRAST:  128mL OMNIPAQUE IOHEXOL 350 MG/ML SOLN COMPARISON:  Chest radiographs done earlier today FINDINGS: Cardiovascular: There is homogeneous enhancement in the thoracic aorta. There is mild ectasia of main pulmonary artery measuring 3.2 cm. There is saddle embolus in the left main pulmonary artery extending into the left upper lobe and left lower lobe branches. There are few small filling defects  in the subsegmental branches in the right middle lobe and right lower lobe. There are no imaging signs of right ventricular strain. Mediastinum/Nodes: No significant lymphadenopathy seen. There is  2.5 cm low-density lesion in the enlarged left lobe of thyroid. Lungs/Pleura: There is moderate right pleural effusion. There are small patchy infiltrates in the medial aspect of right upper lobe in the right upper lung fields. There is 5.2 x 4.1 cm homogeneous density inseparable from interlobar fissure in the anterior right parahilar region. There is narrowing of the lumen of the bronchus leading to this homogeneous opacity in the anteromedial right mid lung fields. Linear patchy infiltrates are seen in both lower lung fields, more so on the right side. In image 13 of series 6, there is 2 cm ground-glass density in the anterior left apex. There is moderate right pleural effusion. There is no significant left pleural effusion. There is no pneumothorax. Upper Abdomen: Fatty liver. Musculoskeletal: There are numerous lytic lesions of varying sizes in the left ribs, thoracic spine and lumbar spine. There is decrease in height of bodies of C6 vertebra. There is also decrease in height of bodies of T6, T7, T8 and T9 vertebrae. Review of the MIP images confirms the above findings. IMPRESSION: There is evidence of acute pulmonary embolism with small to moderate thrombus burden. There are no imaging signs of acute right ventricular strain. There is no evidence of thoracic aortic dissection. There is 4.8 cm homogeneous opacity in the anterior right mid lung fields. This lesion has to be considered primary malignant neoplasm until proven otherwise. There is narrowing of bronchus leading to this parahilar density. PET-CT and biopsy as warranted should be considered. There are numerous lytic lesions of varying sizes along with compression fractures in the cervical and thoracic spine as described in the body of the report consistent with extensive skeletal metastatic disease. Moderate right pleural effusion. There are ground-glass and patchy alveolar infiltrates in both lungs as described in the body of the report suggesting  multifocal pneumonia. There is 2.5 cm low-density nodule in the left lobe of thyroid. When the patient's clinical condition permits, thyroid sonogram may be considered. Electronically Signed   By: Elmer Picker M.D.   On: 11/06/2021 15:21   MR BRAIN W WO CONTRAST  Result Date: 11/09/2021 CLINICAL DATA:  Weight loss, brain metastases suspected EXAM: MRI HEAD WITHOUT AND WITH CONTRAST TECHNIQUE: Multiplanar, multiecho pulse sequences of the brain and surrounding structures were obtained without and with intravenous contrast. CONTRAST:  61mL GADAVIST GADOBUTROL 1 MMOL/ML IV SOLN COMPARISON:  None. FINDINGS: Brain: Round, peripherally enhancing lesion in the left inferior frontal lobe, which measures up to 9 x 9 mm (series 18, image 17), associated hemosiderin deposition, concerning for a hemorrhagic metastasis. This area is associated with hemosiderin deposition. Peripherally enhancing lesion in the left frontal lobe measures up to 4 mm (series 18, image 127). Multiple additional enhancing foci are also seen, some of which correlate with areas of restricted diffusion, described below, while some appear more masslike. These enhancing foci are seen on series 18 on the following images: 136, 120, 117, 115 113, 111, 108, 106, 104, 95, 94, 93, 92, 90, 87, 84, 80, 78, 77, 76, 70, and 59. Scattered foci of increased signal on diffusion-weighted imaging throughout the bilateral cerebral and cerebellar hemispheres, some of which demonstrate ADC correlates; for example several cortical foci and white matter areas in the posterior right frontal lobe (series 5, image 32-34), in the left anterior frontal lobe (series 5,  image 27-28), areas along the right insula (series 5, image 27), left caudate (series 5, image 28), and bilateral cerebellar hemispheres (series 5, image 12-14). Some foci of increased signal on diffusion-weighted imaging do not demonstrate ADC correlates, and may reflect subacute infarcts with normalization  of the ADC. However, some of these correlate with foci of enhancement such as on series 18, image 104. No acute hemorrhage or midline shift. No hydrocephalus or extra-axial collection Vascular: Normal flow voids. Skull and upper cervical spine: Abnormal enhancement in the arch (series 18, image 15) and bilateral lateral masses of C1 (series 18, image 8 and 10), as well as the anterior aspect of C2 (series 18, image 1). Small enhancing focus in the right occipital calvarium (series 18, image 72 Sinuses/Orbits: Negative. Other: Fluid throughout the right mastoid air cells. IMPRESSION: 1. Two round, peripherally enhancing lesions in the left frontal lobe, concerning for metastatic disease. Numerous additional punctate and linear areas of enhancement are also seen, some of which may represent additional metastatic lesions, but some of which may represent enhancing, subacute infarcts. 2. Numerous foci of restricted diffusion in the bilateral cerebral and cerebellar hemispheres, some of which demonstrate ADC correlates and some of which may have normalized ADC values. These are concerning for acute and subacute infarcts, likely embolic in etiology given multiple vascular territories. 3. Abnormal enhancement in C1, C2, and the right occipital calvarium, concerning for osseous metastatic disease. These results were called by telephone at the time of interpretation on 11/09/2021 at 3:58 am to provider [facility] , who verbally acknowledged these results. Electronically Signed   By: Merilyn Baba M.D.   On: 11/09/2021 03:59   CT ABDOMEN PELVIS W CONTRAST  Result Date: 11/06/2021 CLINICAL DATA:  Abdominal pain, acute, nonlocalized. Status post fall EXAM: CT ABDOMEN AND PELVIS WITH CONTRAST TECHNIQUE: Multidetector CT imaging of the abdomen and pelvis was performed using the standard protocol following bolus administration of intravenous contrast. RADIATION DOSE REDUCTION: This exam was performed according to the departmental  dose-optimization program which includes automated exposure control, adjustment of the mA and/or kV according to patient size and/or use of iterative reconstruction technique. CONTRAST:  172mL OMNIPAQUE IOHEXOL 350 MG/ML SOLN COMPARISON:  None. FINDINGS: Lower chest: Right pleural effusion. Please see separately dictated CT angiography chest 11/06/2021. Ports and Devices: None. Liver: Not enlarged. Several vague hypodense right hepatic lobe lesions measuring 0.9, 1.4, 0.7, 0.5 cm. No laceration or subcapsular hematoma. Biliary System: The gallbladder is otherwise unremarkable with no radio-opaque gallstones. No biliary ductal dilatation. Pancreas: Normal pancreatic contour. No main pancreatic duct dilatation. Spleen: Not enlarged. No focal lesion. No laceration, subcapsular hematoma, or vascular injury. Adrenal Glands: No nodularity bilaterally. Kidneys: Bilateral kidneys enhance symmetrically. No hydronephrosis. Subcentimeter hypodensities are too small characterize. A 1.1 cm vague wedge-shaped hypodensity along the left superior renal pole that likely represents a poorly defined lesion with a density of 45 Hounsfield units. No contusion, laceration, or subcapsular hematoma. No injury to the vascular structures or collecting systems. No hydroureter. The urinary bladder is unremarkable. Bowel: No small or large bowel wall thickening or dilatation. Non-visualization of the appendix. The appendix is not definitely identified with no inflammatory changes in the right lower quadrant to suggest acute appendicitis. Mesentery, Omentum, and Peritoneum: No simple free fluid ascites. No pneumoperitoneum. No hemoperitoneum. No mesenteric hematoma identified. No organized fluid collection. Pelvic Organs: Normal. Lymph Nodes: No abdominal, pelvic, inguinal lymphadenopathy. Vasculature: No abdominal aorta or iliac aneurysm. No active contrast extravasation or pseudoaneurysm. Musculoskeletal: No  significant soft tissue hematoma.  No acute pelvic fracture. No spinal fracture. Diffuse appendicular and axial skeleton lytic lesions. IMPRESSION: 1.  No acute traumatic injury to the abdomen, or pelvis. 2. No acute fracture or traumatic malalignment of the lumbar spine. 3. Several vague indeterminate hypodense right hepatic lobe lesions measuring 0.9, 1.4, 0.7, 0.5 cm. Findings could represent metastases. 4. 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 5. An indeterminate 1.1 cm vague slightly wedge-shaped hypodensity along the left superior renal pole. Finding could represent an underlying poorly defined lesion, small infarction, less likely focal pyelonephritis. 6. Please see separately dictated CT angiography chest 11/06/2021. Electronically Signed   By: Iven Finn M.D.   On: 11/06/2021 15:15   PERIPHERAL VASCULAR CATHETERIZATION  Result Date: 11/08/2021 See surgical note for result.  US BIOPSY (LIVER)  Result Date: 11/11/2021 INDICATION: Liver lesion EXAM: Ultrasound-guided core needle biopsy of liver lesion in the right hepatic lobe MEDICATIONS: None. ANESTHESIA/SEDATION: Moderate (conscious) sedation was employed during this procedure. A total of Versed 1 mg and Fentanyl 50 mcg was administered intravenously by the radiology nurse. Total intra-service moderate Sedation Time: 10 minutes. The patient's level of consciousness and vital signs were monitored continuously by radiology nursing throughout the procedure under my direct supervision. FLUOROSCOPY: N/a COMPLICATIONS: None immediate. PROCEDURE: Informed written consent was obtained from the patient after a thorough discussion of the procedural risks, benefits and alternatives. All questions were addressed. Maximal Sterile Barrier Technique was utilized including caps, mask, sterile gowns, sterile gloves, sterile drape, hand hygiene and skin antiseptic. A timeout was performed prior to the initiation of the procedure. The patient was placed supine on the exam table.  Limited ultrasound exam of the liver was performed, which again demonstrated an approximately 1.1 cm hypoechoic nodule in the right hepatic lobe, compatible with recent CT demonstrating multiple small liver lesions. Skin entry site was marked, and the overlying skin was prepped and draped in a standard sterile fashion. Local analgesia was obtained with 1% lidocaine. Under ultrasound guidance, a 17 gauge introducer needle was advanced towards the identified lesion in the right hepatic lobe. Subsequently, core needle biopsy was performed using an 18 gauge core biopsy device x4 passes. Specimens were submitted in formalin to pathology for further handling. Limited postprocedure imaging demonstrated a small hypoechoic area deep to the lesion, suggestive of a small intraparenchymal hematoma. Review of biopsy images demonstrated likely injury to a small portal vein branch. This area was found to be stable over a short observation period. No subcapsular hematoma or bleeding into the hepatorenal space was identified. A clean dressing was placed after manual hemostasis. The patient tolerated the procedure well. IMPRESSION: 1. Successful ultrasound-guided core needle biopsy of a 1.1 cm lesion in the right hepatic lobe. 2. Postprocedure images demonstrate a small intraparenchymal hematoma which was stable over a short observation period. Interventional radiology will follow up with this patient in the morning. IV heparin is to be held pending following day AM CBC demonstrating stable hemoglobin. Electronically Signed   By: Albin Felling M.D.   On: 11/11/2021 16:12   DG Chest Port 1 View  Result Date: 11/07/2021 CLINICAL DATA:  Status post thorenthesis EXAM: PORTABLE CHEST 1 VIEW COMPARISON:  Chest radiograph 10/22/2021. Chest CT November 06, 2021. FINDINGS: Similar versus mildly increased right midlung masslike opacity. There is some fluid tracking along the right minor fissure. No visible pneumothorax. No consolidation.  Similar cardiomediastinal silhouette. IMPRESSION: 1. Similar versus mildly increased right midlung masslike opacity, further characterized on recent chest  CT. There is some fluid tracking along the right minor fissure. 2. No visible pneumothorax status post thoracentesis. Electronically Signed   By: Margaretha Sheffield M.D.   On: 11/07/2021 14:43   ECHOCARDIOGRAM COMPLETE  Result Date: 11/07/2021    ECHOCARDIOGRAM REPORT   Patient Name:   SORAIYA AHNER Date of Exam: 11/07/2021 Medical Rec #:  [id number]      Height:       65.5 in Accession #:    [id number]     Weight:       125.7 lb Date of Birth:  1956/07/16      BSA:          1.633 m Patient Age:    48 years       BP:           124/80 mmHg Patient Gender: F              HR:           78 bpm. Exam Location:  [facility] Procedure: 2D Echo, Cardiac Doppler and Color Doppler Indications:     Pulmonary Embolism I26.09  History:         Patient has no prior history of Echocardiogram examinations.                  Pulmonary emboli, acute pulmonary embolism.  Sonographer:     Sherrie Sport Referring Phys:  [id number] Lequita Halt Diagnosing Phys: Donnelly Angelica IMPRESSIONS  1. Left ventricular ejection fraction, by estimation, is 60 to 65%. The left ventricle has normal function. The left ventricle has no regional wall motion abnormalities. Left ventricular diastolic parameters are consistent with Grade I diastolic dysfunction (impaired relaxation).  2. Right ventricular systolic function is normal. The right ventricular size is mildly enlarged. There is mildly elevated pulmonary artery systolic pressure. The estimated right ventricular systolic pressure is 24.5 mmHg.  3. Focal thickening of MV leaflet. Correlate clinically. . The mitral valve is degenerative. Mild mitral valve regurgitation. No evidence of mitral stenosis.  4. The aortic valve is normal in structure. Aortic valve regurgitation is not visualized. No aortic stenosis is present.  5. The inferior vena cava is normal in size  with <50% respiratory variability, suggesting right atrial pressure of 8 mmHg. FINDINGS  Left Ventricle: Left ventricular ejection fraction, by estimation, is 60 to 65%. The left ventricle has normal function. The left ventricle has no regional wall motion abnormalities. The left ventricular internal cavity size was normal in size. There is  no left ventricular hypertrophy. Left ventricular diastolic parameters are consistent with Grade I diastolic dysfunction (impaired relaxation). Right Ventricle: The right ventricular size is mildly enlarged. No increase in right ventricular wall thickness. Right ventricular systolic function is normal. There is mildly elevated pulmonary artery systolic pressure. The tricuspid regurgitant velocity is 2.81 m/s, and with an assumed right atrial pressure of 8 mmHg, the estimated right ventricular systolic pressure is 80.9 mmHg. Left Atrium: Left atrial size was normal in size. Right Atrium: Right atrial size was normal in size. Pericardium: There is no evidence of pericardial effusion. Mitral Valve: Focal thickening of MV leaflet. Correlate clinically. The mitral valve is degenerative in appearance. Mild mitral valve regurgitation. No evidence of mitral valve stenosis. MV peak gradient, 6.7 mmHg. The mean mitral valve gradient is 3.0 mmHg. Tricuspid Valve: The tricuspid valve is normal in structure. Tricuspid valve regurgitation is mild . No evidence of tricuspid stenosis. Aortic Valve: The aortic valve is normal in structure.  Aortic valve regurgitation is not visualized. No aortic stenosis is present. Aortic valve mean gradient measures 3.0 mmHg. Aortic valve peak gradient measures 4.8 mmHg. Aortic valve area, by VTI measures 2.53 cm. Pulmonic Valve: The pulmonic valve was normal in structure. Pulmonic valve regurgitation is not visualized. No evidence of pulmonic stenosis. Aorta: The aortic root is normal in size and structure. Venous: The inferior vena cava is normal in size  with less than 50% respiratory variability, suggesting right atrial pressure of 8 mmHg. IAS/Shunts: No atrial level shunt detected by color flow Doppler.  LEFT VENTRICLE PLAX 2D LVIDd:         3.70 cm   Diastology LVIDs:         2.30 cm   LV e' medial:    5.11 cm/s LV PW:         1.40 cm   LV E/e' medial:  15.9 LV IVS:        1.10 cm   LV e' lateral:   4.35 cm/s LVOT diam:     2.00 cm   LV E/e' lateral: 18.7 LV SV:         52 LV SV Index:   32 LVOT Area:     3.14 cm  RIGHT VENTRICLE RV Basal diam:  2.90 cm RV S prime:     13.10 cm/s TAPSE (M-mode): 2.1 cm LEFT ATRIUM             Index        RIGHT ATRIUM           Index LA diam:        2.50 cm 1.53 cm/m   RA Area:     14.40 cm LA Vol (A2C):   55.7 ml 34.11 ml/m  RA Volume:   34.30 ml  21.01 ml/m LA Vol (A4C):   50.6 ml 30.99 ml/m LA Biplane Vol: 53.7 ml 32.89 ml/m  AORTIC VALVE                    PULMONIC VALVE AV Area (Vmax):    2.69 cm     PV Vmax:        0.70 m/s AV Area (Vmean):   2.50 cm     PV Vmean:       47.133 cm/s AV Area (VTI):     2.53 cm     PV VTI:         0.128 m AV Vmax:           109.00 cm/s  PV Peak grad:   1.9 mmHg AV Vmean:          74.700 cm/s  PV Mean grad:   1.0 mmHg AV VTI:            0.206 m      RVOT Peak grad: 3 mmHg AV Peak Grad:      4.8 mmHg AV Mean Grad:      3.0 mmHg LVOT Vmax:         93.30 cm/s LVOT Vmean:        59.400 cm/s LVOT VTI:          0.166 m LVOT/AV VTI ratio: 0.81  AORTA Ao Root diam: 3.17 cm MITRAL VALVE                TRICUSPID VALVE MV Area (PHT): 3.17 cm     TR Peak grad:   31.6 mmHg MV Area VTI:   1.83 cm  TR Vmax:        281.00 cm/s MV Peak grad:  6.7 mmHg MV Mean grad:  3.0 mmHg     SHUNTS MV Vmax:       1.29 m/s     Systemic VTI:  0.17 m MV Vmean:      75.9 cm/s    Systemic Diam: 2.00 cm MV Decel Time: 239 msec     Pulmonic VTI:  0.158 m MV E velocity: 81.40 cm/s MV A velocity: 109.00 cm/s MV E/A ratio:  0.75 Donnelly Angelica Electronically signed by Donnelly Angelica Signature Date/Time: 11/07/2021/1:06:16 PM     Final    US THORACENTESIS ASP PLEURAL SPACE W/IMG GUIDE  Result Date: 11/07/2021 INDICATION: 66 year old with right lung mass and concern for metastatic disease. EXAM: ULTRASOUND GUIDED RIGHT THORACENTESIS MEDICATIONS: None. COMPLICATIONS: None immediate. PROCEDURE: An ultrasound guided thoracentesis was thoroughly discussed with the patient and questions answered. The benefits, risks, alternatives and complications were also discussed. The patient understands and wishes to proceed with the procedure. Written consent was obtained. Ultrasound was performed to localize and mark an adequate pocket of fluid in the right chest. The area was then prepped and draped in the normal sterile fashion. 1% Lidocaine was used for local anesthesia. Under ultrasound guidance a 6 Fr Safe-T-Centesis catheter was introduced. Thoracentesis was performed. The catheter was removed and a dressing applied. FINDINGS: A total of approximately 350 mL of amber colored fluid was removed. Samples were sent to the laboratory as requested by the clinical team. IMPRESSION: Successful ultrasound guided right thoracentesis yielding 350 mL of pleural fluid. Electronically Signed   By: Markus Daft M.D.   On: 11/07/2021 15:01    Assessment and plan-   Patient is a 66 y.o. female with known DVT on Eliquis presented with chest pain and unsteady gait.   work-up showed pulmonary embolism, right pleural effusion, lung mass with skeletal liver lesions, metastatic brain lesions and stroke.   #Acute pulmonary embolism On anticoagulation with Xarelto,    #Stage IV lung cancer.   I had a lengthy discussion with patient and her sister.  Overall, prognosis likely poor due to extensive metastasis including CNS lesions. Patient understands that this condition is not curable.   Prognosis may change if she has any targetable mutation, high immunotherapy marker. Vast majority patients will need chemotherapy and immunotherapy. Patient desires treatment.  Patient will need outpatient follow-up with oncology.  We will schedule patient to establish care with radiation oncology for radiation. #acute kidney failure, IV hydration.   #right hand weakness, with her new neurological deficit, recommend MRI brain, and MRI cervical spine.   #Multiple acute and subacute stroke, probably embolic source.   Neurology following.   PT/OT.   Thank you for to allowing me to participate in the care of this patient.   Earlie Server, MD, PhD Hematology Oncology 11/13/2021

## 2021-11-13 NOTE — Progress Notes (Signed)
°   11/13/21 1340  Clinical Encounter Type  Visited With Patient and family together  Visit Type Initial;Spiritual support;Social support  Referral From Chaplain  Consult/Referral To [place] (Comment) (AD)   Chaplain Burris met Pt and her sister, Eustaquio Maize, through [place] who previously offered support and assistance with AD.  Chaplain Burris engaged Pt to assess her cognitive ability to authorize this document and also to assess her emotional well-being since receiving her diagnosis. Chaplain Burris offered compassionate, non-anxious presence. Chaplain B helped Pt to begin forming a relationship of trust and support. Daryel November is on-call so informed Pt that she will be available later this evening if she would like to have an extended conversation about feelings, faith, and to process what she is confronting.  Chaplain B also worked to verify advanced directive completion but was unable to secure everyone to notarize document before Pt was taken for imaging. Chaplain B communicated with Beth again and let her know that we could try later this afternoon to notarize the AD. Will f/u later on2/8. Nursing staff may page chaplain as needed.

## 2021-11-13 NOTE — Progress Notes (Signed)
Nutrition Follow-up  DOCUMENTATION CODES:   Not applicable  INTERVENTION:   -D/c Ensure Enlive -Continue MVI with minerals daily -30 ml Prosource Plus BID, each supplement provides 100 kcals and 15 grams protein -Mighty Shake TID with meals, each supplement provides 220 kcals and 6 grams protein  NUTRITION DIAGNOSIS:   Inadequate oral intake related to decreased appetite as evidenced by per patient/family report.  Ongoing  GOAL:   Patient will meet greater than or equal to 90% of their needs  Progressing   MONITOR:   PO intake, Supplement acceptance, Labs, Weight trends, I & O's  REASON FOR ASSESSMENT:   Consult Assessment of nutrition requirement/status  ASSESSMENT:   66 yo female with a PMH of  recently diagnosed left leg DVT 3 weeks ago on Eliquis, fibromyalgia, bipolar disorder, HLD, chronic peripheral neuropathy, presented with worsening of chest pains, and frequent falls. Symptoms started about 3 weeks ago, symptoms almost developed the same time, which includes left leg swelling and pain "tenderness of bilateral rib cages" pleural chest pain worsening with deep breath and cough, and frequent feeling of lightheadedness, and to fall several times last week. Admited with PE.  2/2- s/p rt thoracentesis (350 ml fluid removed) 2/3- s/p pulmonary thrombectomy 2/6- s/p liver lesion biopsy  Reviewed I/O's: +7.4 ml x 24 hours and +2.6 L since admission  UOP: 500 ml x 24 hours  Pt unavailable at time of visit. Attempted to speak with pt via call to hospital room phone, however, unable to reach.   Per MD notes, pt with suspected stage IV lung cancer with metastasis to bone, liver, and brain. Noted acute stroke may be related to underlying malignancy.   Pt with improved oral intake. Noted meal completions 100%. Pt inconsistent with Ensure supplements, refusing the past several doses.   Reviewed wt hx; noted wt has been stable since admission.   Per TOC notes, plan for  SNF once medically stable for discharge.   Medications reviewed and include miralax, senokot, and lactated ringers infusion @ 125 ml/hr.  Labs reviewed.   Diet Order:   Diet Order             Diet regular Room service appropriate? Yes; Fluid consistency: Thin  Diet effective now                   EDUCATION NEEDS:   Education needs have been addressed  Skin:  Skin Assessment: Reviewed RN Assessment  Last BM:  11/10/21  Height:   Ht Readings from Last 1 Encounters:  11/06/21 5' 5.51" (1.664 m)    Weight:   Wt Readings from Last 1 Encounters:  11/11/21 56 kg   BMI:  Body mass index is 20.22 kg/m.  Estimated Nutritional Needs:   Kcal:  1700-1900  Protein:  85-100 grams  Fluid:  > 1.7 L    Loistine Chance, RD, LDN, [place] Registered Dietitian II Certified Diabetes Care and Education Specialist Please refer to [facility] for RD and/or RD on-call/weekend/after hours pager

## 2021-11-13 NOTE — Consult Note (Addendum)
ANTICOAGULATION CONSULT NOTE   Pharmacy Consult for Heparin dosing Indication: pulmonary embolus and new embolic stroke  Allergies  Allergen Reactions   Sulfa Antibiotics Nausea Only and Nausea And Vomiting    Patient Measurements: Height: 5' 5.51" (166.4 cm) Weight: 56 kg (123 lb 7.3 oz) IBW/kg (Calculated) : 58.18  Vital Signs: Temp: 99.2 F (37.3 C) (02/08 1654) Temp Source: Oral (02/08 1654) BP: 118/76 (02/08 1654) Pulse Rate: 101 (02/08 1654)  Labs: Recent Labs    11/11/21 0551 11/12/21 0552 11/13/21 0609  HGB 9.3* 10.4* 10.1*  HCT 29.2* 33.2* 32.1*  PLT 235 143* 120*  HEPARINUNFRC 0.39 0.31  --   CREATININE 0.82  --  1.26*     Estimated Creatinine Clearance: 38.8 mL/min (A) (by C-G formula based on SCr of 1.26 mg/dL (H)).   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   Acute pulmonary embolism ([place]) 11/06/2021   Bipolar 2 disorder ([facility])    Body mass index (BMI) 19.9 or less, adult 11/09/2021   CVA (cerebral vascular accident) ([place]) 11/09/2021   DVT of leg (deep venous thrombosis) ([place]) 11/09/2021   Dysplastic nevus    R calf, txted in past, pt states ~2002   Fibromyalgia    Psoriasis    Thyroid nodule 11/09/2021    Medications:  Apixaban 5mg  BID (for DVT lower extremity)  Assessment: patient is a 66 year old female recently diagnosed with a DVT about 3 weeks ago who is on Eliquis who presents with some left upper abdominal pain and back pain for several weeks.CT positive for pulmonary emboli.   On 2/8 PM, patient with new embolic stroke. MRI brain: "Compared to the recent prior MRI, there are multiple new small foci of cortical and subcortical restricted diffusion in both cerebral hemispheres primarily in the frontal lobes. Some of these have an appearance most suggestive of subacute embolic infarcts"  Transitioning back to IV anticoagulation.  Goal of Therapy:   Heparin level 0.3-0.7 units/ml aPTT 66-102 seconds Monitor platelets by anticoagulation protocol: Yes   Plan: Restarting heparin without bolus 12 hours after last Xarelto administration ( 2/8 1814 >> 2/9 ~0600) No bolus. Heparin infusion 1000 units/hr starting 02/09 at 0600. Monitoring via aPTT until correlation with HL.  Recheck 6-hour aPTT. CBC and HL daily.   Lorna Dibble, PharmD, [facility] Clinical Pharmacist 11/14/2021 9:39 AM

## 2021-11-13 NOTE — Clinical Social Work Note (Signed)
RE:  Cheryl Schroeder   Date of Birth:   June 02, 1957______  Date: 11/13/21      To Whom It May Concern:  Please be advised that the above-named patient will require a short-term nursing home stay - anticipated 30 days or less for rehabilitation and strengthening.  The plan is for return home.   MD electronic signature noted below

## 2021-11-13 NOTE — Progress Notes (Signed)
PROGRESS NOTE    Cheryl Schroeder  YKD:[id number] DOB: 11-23-1955 DOA: 11/06/2021 PCP: Lynnell Jude, MD    Brief Narrative:  66 year old woman PMH recent outpatient diagnosis DVT treated with apixaban, presented with chest pain and frequent falls.  CT revealed acute saddle embolism and multiple PE as well as right middle lung mass and skeletal lesions concerning for malignancy.  Seen by pulmonology with recommendation for malignancy work-up. S/p right-sided thoracentesis. Seen by vascular surgery and underwent left pulmonary mechanical thrombectomy as well as placement of IVC.  Brain MRI concerning for metastatic disease as well as subacute infarcts.  Neurology consulted.  Awaiting cytology from thoracentesis.  Given need for anticoagulation, obtained liver biopsy prior to transition to oral anticoagulation.    Assessment & Plan:   Principal Problem:   Acute pulmonary embolism (HCC) Active Problems:   Malignant pleural effusion   Mass of right lung   Bone lesion   Liver lesion   Demand ischemia (HCC)   Acquired thrombophilia (HCC)   DVT of leg (deep venous thrombosis) (HCC)   Thyroid nodule   Renal lesion   Brain mass   CVA (cerebral vascular accident) ([place])   Body mass index (BMI) 19.9 or less, adult   Liver hematoma   Liver metastases (HCC)   Pleural effusion  Right lung mass. 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 Thyroid nodule. Renal lesion. Patient is status post liver biopsy.  Worsening right hand weakness since yesterday.  I have confirmed with daughter, right hand weakness that was not present before yesterday. Neurology and oncology notified.  After discussion with oncology, will repeat brain MRI with contrast.  Patient had left frontal lobe lesion on MRI of 2/4, consistent with brain metastasis.  Orthostatic hypotension. Acute kidney injury. Hyperkalemia secondary to acute kidney injury. Patient was very dizzy when she  stand up with physical therapy. Patient appears to be dehydrated, will give 1 L of lactated Ringer's.  Patient also received a dose of Kayexalate. Recheck labs tomorrow. I also repeat a liver ultrasound to make sure hematoma is not getting worse  Liver hematoma secondary to liver biopsy. Hemoglobin has been stable.  Recheck again today.  Acute pulm embolism. DVT. Acquired thrombophilia. Demand ischemia with elevated troponin secondary to PE. Currently treated with Xarelto  Malignant pleural effusion. S/p thoracentesis.  Acute embolic stroke. Secondary to underlying malignancy.  MRI of the brain showed multiple embolic infarcts in addition to brain mets.  Continue oral anticoagulation.    DVT prophylaxis: Xarelto. Code Status: Discussed with the patient daughter and patient herself, decision was made to change CODE STATUS to DO NOT RESUSCITATE. Family Communication: Daughter updated. Disposition Plan:    Status is: Inpatient  Remains inpatient appropriate because:  severity of diseases.      I/O last 3 completed shifts: In: 507.4 [P.O.:480; I.V.:27.4] Out: 500 [Urine:500] No intake/output data recorded.     Consultants:  Neurology and oncology.  Procedures: None  Antimicrobials: none  Subjective: Patient was very dizzy and when she stands up, she was very orthostatic. Also noticed a significant weakness in the right arm which was not present 2 days ago. No short of breath or cough. No fever chills  Objective: Vitals:   11/12/21 1600 11/12/21 2019 11/13/21 0837 11/13/21 0900  BP:  115/66 98/79 119/74  Pulse:  (!) 102 99 93  Resp: 16 19  17   Temp:  98 F (36.7 C) 98.3 F (36.8 C) 98.3 F (36.8  C)  TempSrc:      SpO2:  95% 96% 96%  Weight:      Height:        Intake/Output Summary (Last 24 hours) at 11/13/2021 1109 Last data filed at 11/12/2021 1900 Gross per 24 hour  Intake 507.43 ml  Output 500 ml  Net 7.43 ml   Filed Weights   11/07/21 0413  11/09/21 0500 11/11/21 0458  Weight: 57 kg 54.6 kg 56 kg    Examination:  General exam: Appears calm and comfortable  Respiratory system: Clear to auscultation. Respiratory effort normal. Cardiovascular system: S1 & S2 heard, RRR. No JVD, murmurs, rubs, gallops or clicks. No pedal edema. Gastrointestinal system: Abdomen is nondistended, soft and nontender. No organomegaly or masses felt. Normal bowel sounds heard. Central nervous system: Alert and oriented.  Significant right hand weakness. Extremities: Symmetric 5 x 5 power. Skin: No rashes, lesions or ulcers Psychiatry: Judgement and insight appear normal. Mood & affect appropriate.     Data Reviewed: I have personally reviewed following labs and imaging studies  CBC: Recent Labs  Lab 11/06/21 1257 11/08/21 0556 11/09/21 0219 11/10/21 0441 11/11/21 0551 11/12/21 0552 11/13/21 0609  WBC 9.9   < > 9.4 10.1 8.2 15.1* 14.6*  NEUTROABS 7.8*  --   --   --   --   --   --   HGB 12.1   < > 10.1* 10.5* 9.3* 10.4* 10.1*  HCT 38.0   < > 32.1* 32.9* 29.2* 33.2* 32.1*  MCV 93.4   < > 94.1 92.2 93.3 94.1 93.0  PLT 139*   < > 229 249 235 143* 120*   < > = values in this interval not displayed.   Basic Metabolic Panel: Recent Labs  Lab 11/08/21 0556 11/09/21 0219 11/10/21 0441 11/11/21 0551 11/13/21 0609  NA 138 136 135 140 136  K 4.2 5.0 4.8 4.1 5.3*  CL 104 102 102 107 101  CO2 27 28 28 30 26   GLUCOSE 92 112* 102* 103* 115*  BUN 12 17 14 19  37*  CREATININE 0.89 0.95 0.88 0.82 1.26*  CALCIUM 9.4 9.0 9.3 9.0 9.6  MG 2.1  --   --   --  2.4  PHOS 4.2  --   --   --   --    GFR: Estimated Creatinine Clearance: 38.8 mL/min (A) (by C-G formula based on SCr of 1.26 mg/dL (H)). Liver Function Tests: Recent Labs  Lab 11/06/21 1257  AST 34  ALT 20  ALKPHOS 196*  BILITOT 0.8  PROT 8.1  ALBUMIN 4.0   Recent Labs  Lab 11/06/21 1257  LIPASE 35   No results for input(s): AMMONIA in the last 168 hours. Coagulation  Profile: Recent Labs  Lab 11/06/21 1257  INR 1.3*   Cardiac Enzymes: No results for input(s): CKTOTAL, CKMB, CKMBINDEX, TROPONINI in the last 168 hours. BNP (last 3 results) No results for input(s): PROBNP in the last 8760 hours. HbA1C: No results for input(s): HGBA1C in the last 72 hours. CBG: No results for input(s): GLUCAP in the last 168 hours. Lipid Profile: No results for input(s): CHOL, HDL, LDLCALC, TRIG, CHOLHDL, LDLDIRECT in the last 72 hours. Thyroid Function Tests: No results for input(s): TSH, T4TOTAL, FREET4, T3FREE, THYROIDAB in the last 72 hours. Anemia Panel: No results for input(s): VITAMINB12, FOLATE, FERRITIN, TIBC, IRON, RETICCTPCT in the last 72 hours. Sepsis Labs: No results for input(s): PROCALCITON, LATICACIDVEN in the last 168 hours.  Recent Results (from the past  240 hour(s))  Resp Panel by RT-PCR (Flu A&B, Covid) Nasopharyngeal Swab     Status: None   Collection Time: 11/06/21  3:49 PM   Specimen: Nasopharyngeal Swab; Nasopharyngeal(NP) swabs in vial transport medium  Result Value Ref Range Status   SARS Coronavirus 2 by RT PCR NEGATIVE NEGATIVE Final    Comment: (NOTE) SARS-CoV-2 target nucleic acids are NOT DETECTED.  The SARS-CoV-2 RNA is generally detectable in upper respiratory specimens during the acute phase of infection. The lowest concentration of SARS-CoV-2 viral copies this assay can detect is 138 copies/mL. A negative result does not preclude SARS-Cov-2 infection and should not be used as the sole basis for treatment or other patient management decisions. A negative result may occur with  improper specimen collection/handling, submission of specimen other than nasopharyngeal swab, presence of viral mutation(s) within the areas targeted by this assay, and inadequate number of viral copies(<138 copies/mL). A negative result must be combined with clinical observations, patient history, and epidemiological information. The expected result  is Negative.  Fact Sheet for Patients:  EntrepreneurPulse.com.au  Fact Sheet for Healthcare Providers:  IncredibleEmployment.be  This test is no t yet approved or cleared by the [place] FDA and  has been authorized for detection and/or diagnosis of SARS-CoV-2 by FDA under an Emergency Use Authorization (EUA). This EUA will remain  in effect (meaning this test can be used) for the duration of the COVID-19 declaration under Section 564(b)(1) of the Act, 21 U.S.C.section 360bbb-3(b)(1), unless the authorization is terminated  or revoked sooner.       Influenza A by PCR NEGATIVE NEGATIVE Final   Influenza B by PCR NEGATIVE NEGATIVE Final    Comment: (NOTE) The Xpert Xpress SARS-CoV-2/FLU/RSV plus assay is intended as an aid in the diagnosis of influenza from Nasopharyngeal swab specimens and should not be used as a sole basis for treatment. Nasal washings and aspirates are unacceptable for Xpert Xpress SARS-CoV-2/FLU/RSV testing.  Fact Sheet for Patients: EntrepreneurPulse.com.au  Fact Sheet for Healthcare Providers: IncredibleEmployment.be  This test is not yet approved or cleared by the [place] FDA and has been authorized for detection and/or diagnosis of SARS-CoV-2 by FDA under an Emergency Use Authorization (EUA). This EUA will remain in effect (meaning this test can be used) for the duration of the COVID-19 declaration under Section 564(b)(1) of the Act, 21 U.S.C. section 360bbb-3(b)(1), unless the authorization is terminated or revoked.  Performed at [facility], [address] [place], [place] [address]   CULTURE, BLOOD (ROUTINE X 2) w Reflex to ID Panel     Status: None (Preliminary result)   Collection Time: 11/07/21  4:37 PM   Specimen: BLOOD  Result Value Ref Range Status   Specimen Description BLOOD LEFT ANTECUBITAL  Final   Special Requests   Final    BOTTLES DRAWN  AEROBIC AND ANAEROBIC Blood Culture adequate volume   Culture   Final    NO GROWTH 4 DAYS Performed at [facility], [address] [place], [place] [address]    Report Status PENDING  Incomplete  CULTURE, BLOOD (ROUTINE X 2) w Reflex to ID Panel     Status: None (Preliminary result)   Collection Time: 11/07/21  4:38 PM   Specimen: BLOOD  Result Value Ref Range Status   Specimen Description BLOOD BLOOD LEFT ARM  Final   Special Requests   Final    BOTTLES DRAWN AEROBIC AND ANAEROBIC Blood Culture adequate volume   Culture   Final  NO GROWTH 4 DAYS Performed at [facility], [address] [place], [place] [address]    Report Status PENDING  Incomplete         Radiology Studies: US BIOPSY (LIVER)  Result Date: 11/11/2021 INDICATION: Liver lesion EXAM: Ultrasound-guided core needle biopsy of liver lesion in the right hepatic lobe MEDICATIONS: None. ANESTHESIA/SEDATION: Moderate (conscious) sedation was employed during this procedure. A total of Versed 1 mg and Fentanyl 50 mcg was administered intravenously by the radiology nurse. Total intra-service moderate Sedation Time: 10 minutes. The patient's level of consciousness and vital signs were monitored continuously by radiology nursing throughout the procedure under my direct supervision. FLUOROSCOPY: N/a COMPLICATIONS: None immediate. PROCEDURE: Informed written consent was obtained from the patient after a thorough discussion of the procedural risks, benefits and alternatives. All questions were addressed. Maximal Sterile Barrier Technique was utilized including caps, mask, sterile gowns, sterile gloves, sterile drape, hand hygiene and skin antiseptic. A timeout was performed prior to the initiation of the procedure. The patient was placed supine on the exam table. Limited ultrasound exam of the liver was performed, which again demonstrated an approximately 1.1 cm hypoechoic nodule in the right hepatic lobe,  compatible with recent CT demonstrating multiple small liver lesions. Skin entry site was marked, and the overlying skin was prepped and draped in a standard sterile fashion. Local analgesia was obtained with 1% lidocaine. Under ultrasound guidance, a 17 gauge introducer needle was advanced towards the identified lesion in the right hepatic lobe. Subsequently, core needle biopsy was performed using an 18 gauge core biopsy device x4 passes. Specimens were submitted in formalin to pathology for further handling. Limited postprocedure imaging demonstrated a small hypoechoic area deep to the lesion, suggestive of a small intraparenchymal hematoma. Review of biopsy images demonstrated likely injury to a small portal vein branch. This area was found to be stable over a short observation period. No subcapsular hematoma or bleeding into the hepatorenal space was identified. A clean dressing was placed after manual hemostasis. The patient tolerated the procedure well. IMPRESSION: 1. Successful ultrasound-guided core needle biopsy of a 1.1 cm lesion in the right hepatic lobe. 2. Postprocedure images demonstrate a small intraparenchymal hematoma which was stable over a short observation period. Interventional radiology will follow up with this patient in the morning. IV heparin is to be held pending following day AM CBC demonstrating stable hemoglobin. Electronically Signed   By: Albin Felling M.D.   On: 11/11/2021 16:12        Scheduled Meds:  feeding supplement  237 mL Oral BID BM   FLUoxetine  40 mg Oral Daily   fluticasone  1 spray Each Nare Daily   gabapentin  300 mg Oral TID   lamoTRIgine  200 mg Oral Daily   multivitamin with minerals  1 tablet Oral Daily   pantoprazole  40 mg Oral Daily   polyethylene glycol  17 g Oral Daily   pravastatin  40 mg Oral QHS   Rivaroxaban  15 mg Oral BID WC   Followed by   Derrill Memo ON 12/03/2021] rivaroxaban  20 mg Oral Q supper   senna-docusate  2 tablet Oral BID    sodium chloride flush  3 mL Intravenous Q12H   Continuous Infusions:  sodium chloride     lactated ringers       LOS: 7 days    Time spent: 37 minutes    Sharen Hones, MD [facility]   To contact the attending provider between 7A-7P or  the covering provider during after hours 7P-7A, please log into the web site [email or web address] and access using universal [place] password for that web site. If you do not have the password, please call the hospital operator.  11/13/2021, 11:09 AM

## 2021-11-13 NOTE — TOC Initial Note (Signed)
Transition of Care [facility]) - Initial/Assessment Note    Patient Details  Name: Cheryl Schroeder MRN: [id number] Date of Birth: Jul 16, 1956  Transition of Care [facility]) CM/SW Contact:    Eileen Stanford, LCSW Phone Number: 11/13/2021, 3:21 PM  Clinical Narrative:     Pt's sister  Eustaquio Maize present at bedside. Family agreeable to SNF. Family would prefer [facility] due to location. Referral sent.             Expected Discharge Plan: Skilled Nursing Facility Barriers to Discharge: Continued Medical Work up   Patient Goals and CMS Choice Patient states their goals for this hospitalization and ongoing recovery are:: for pt to get better CMS Medicare.gov Compare Post Acute Care list provided to:: Patient Choice offered to / list presented to : Sibling  Expected Discharge Plan and Services Expected Discharge Plan: [place] In-house Referral: NA   Post Acute Care Choice: [place] Living arrangements for the past 2 months: Single Family Home                                      Prior Living Arrangements/Services Living arrangements for the past 2 months: Single Family Home Lives with:: Self Patient language and need for interpreter reviewed:: Yes Do you feel safe going back to the place where you live?: Yes      Need for Family Participation in Patient Care: Yes (Comment) Care giver support system in place?: Yes (comment)   Criminal Activity/Legal Involvement Pertinent to Current Situation/Hospitalization: No - Comment as needed  Activities of Daily Living Home Assistive Devices/Equipment: Eyeglasses ADL Screening (condition at time of admission) Patient's cognitive ability adequate to safely complete daily activities?: Yes Is the patient deaf or have difficulty hearing?: No Does the patient have difficulty seeing, even when wearing glasses/contacts?: No Does the patient have difficulty concentrating, remembering, or making decisions?: No Patient able to  express need for assistance with ADLs?: Yes Does the patient have difficulty dressing or bathing?: No Independently performs ADLs?: Yes (appropriate for developmental age) Does the patient have difficulty walking or climbing stairs?: No Weakness of Legs: Both Weakness of Arms/Hands: None  Permission Sought/Granted Permission sought to share information with : Family Supports Permission granted to share information with : Yes, Release of Information Signed  Share Information with NAME: Beth     Permission granted to share info w Relationship: sister     Emotional Assessment Appearance:: Appears stated age     Orientation: : Oriented to Self, Oriented to Place, Oriented to  Time, Oriented to Situation Alcohol / Substance Use: Not Applicable Psych Involvement: No (comment)  Admission diagnosis:  Epigastric pain [R10.13] Pulmonary emboli (HCC) [I26.99] Patient Active Problem List   Diagnosis Date Noted   Orthostatic hypotension 11/13/2021   Liver hematoma 11/11/2021   Liver metastases ([place])    Pleural effusion    Demand ischemia ([place]) 11/09/2021   Acquired thrombophilia ([place]) 11/09/2021   DVT of leg (deep venous thrombosis) ([place]) 11/09/2021   Thyroid nodule 11/09/2021   Renal lesion 11/09/2021   Brain mass 11/09/2021   CVA (cerebral vascular accident) ([place]) 11/09/2021   Body mass index (BMI) 19.9 or less, adult 11/09/2021   Malignant pleural effusion    Mass of right lung    Bone lesion    Liver lesion    Acute pulmonary embolism ([place]) 11/06/2021   Pulmonary emboli ([place]) 11/06/2021   Plantar  fasciitis 07/09/2014   PCP:  Lynnell Jude, MD Pharmacy:   [id number] [place], [place] [place] [address] Phone: [phone] Fax: [place], [place] [address] [place] [address] Phone: [phone] Fax: [phone]     Social Determinants of Health (SDOH)  Interventions    Readmission Risk Interventions No flowsheet data found.

## 2021-11-14 ENCOUNTER — Other Ambulatory Visit: Payer: Self-pay | Admitting: Pathology

## 2021-11-14 ENCOUNTER — Inpatient Hospital Stay: Payer: Medicare HMO

## 2021-11-14 DIAGNOSIS — I63423 Cerebral infarction due to embolism of bilateral anterior cerebral arteries: Secondary | ICD-10-CM

## 2021-11-14 DIAGNOSIS — J910 Malignant pleural effusion: Secondary | ICD-10-CM

## 2021-11-14 DIAGNOSIS — C7931 Secondary malignant neoplasm of brain: Secondary | ICD-10-CM

## 2021-11-14 DIAGNOSIS — Z515 Encounter for palliative care: Secondary | ICD-10-CM | POA: Diagnosis not present

## 2021-11-14 DIAGNOSIS — I2692 Saddle embolus of pulmonary artery without acute cor pulmonale: Secondary | ICD-10-CM | POA: Diagnosis not present

## 2021-11-14 LAB — CBC
HCT: 29.7 % — ABNORMAL LOW (ref 36.0–46.0)
Hemoglobin: 9.6 g/dL — ABNORMAL LOW (ref 12.0–15.0)
MCH: 29.7 pg (ref 26.0–34.0)
MCHC: 32.3 g/dL (ref 30.0–36.0)
MCV: 92 fL (ref 80.0–100.0)
Platelets: 93 10*3/uL — ABNORMAL LOW (ref 150–400)
RBC: 3.23 MIL/uL — ABNORMAL LOW (ref 3.87–5.11)
RDW: 13.6 % (ref 11.5–15.5)
WBC: 20.1 10*3/uL — ABNORMAL HIGH (ref 4.0–10.5)
nRBC: 0 % (ref 0.0–0.2)

## 2021-11-14 LAB — BASIC METABOLIC PANEL
Anion gap: 11 (ref 5–15)
BUN: 35 mg/dL — ABNORMAL HIGH (ref 8–23)
CO2: 25 mmol/L (ref 22–32)
Calcium: 9 mg/dL (ref 8.9–10.3)
Chloride: 96 mmol/L — ABNORMAL LOW (ref 98–111)
Creatinine, Ser: 1.1 mg/dL — ABNORMAL HIGH (ref 0.44–1.00)
GFR, Estimated: 55 mL/min — ABNORMAL LOW (ref >60)
Glucose, Bld: 138 mg/dL — ABNORMAL HIGH (ref 70–99)
Potassium: 4.3 mmol/L (ref 3.5–5.1)
Sodium: 132 mmol/L — ABNORMAL LOW (ref 135–145)

## 2021-11-14 LAB — SURGICAL PATHOLOGY

## 2021-11-14 LAB — APTT: aPTT: 126 seconds — ABNORMAL HIGH (ref 24–36)

## 2021-11-14 LAB — VITAMIN B12: Vitamin B-12: 913 pg/mL (ref 180–914)

## 2021-11-14 LAB — MAGNESIUM: Magnesium: 2.4 mg/dL (ref 1.7–2.4)

## 2021-11-14 LAB — IMMATURE PLATELET FRACTION: Immature Platelet Fraction: 6.9 % (ref 1.2–8.6)

## 2021-11-14 LAB — FOLATE: Folate: 15.7 ng/mL (ref >5.9)

## 2021-11-14 LAB — PATHOLOGIST SMEAR REVIEW

## 2021-11-14 MED ORDER — ARGATROBAN 50 MG/50ML IV SOLN
1.0000 ug/kg/min | INTRAVENOUS | Status: DC
  Administered 2021-11-14: 1 ug/kg/min via INTRAVENOUS
  Filled 2021-11-14 (×2): qty 50

## 2021-11-14 MED ORDER — TAMSULOSIN HCL 0.4 MG PO CAPS
0.4000 mg | ORAL_CAPSULE | Freq: Every day | ORAL | Status: DC
  Administered 2021-11-14 – 2021-11-16 (×3): 0.4 mg via ORAL
  Filled 2021-11-14 (×3): qty 1

## 2021-11-14 MED ORDER — ARGATROBAN 50 MG/50ML IV SOLN
0.3000 ug/kg/min | INTRAVENOUS | Status: AC
  Administered 2021-11-14: 0.5 ug/kg/min via INTRAVENOUS
  Administered 2021-11-15: 12:00:00 0.3 ug/kg/min via INTRAVENOUS
  Filled 2021-11-14: qty 50

## 2021-11-14 NOTE — Progress Notes (Signed)
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 Also has mets to the liver. Patient was seen by neurology for evaluation of dizziness and MRI brain was noted to have subacute and acute strokes along with brain metastases.  She continued to do reasonably well during admission but yesterday had sudden onset of difficulty with her gait and instability for which an MRI was repeated which revealed furthermore acute strokes that were not seen on the MRI from 4 days ago.  O:// Current vital signs: BP 118/81 (BP Location: Left Arm)    Pulse (!) 101    Temp 98.1 F (36.7 C) (Oral)    Resp 16    Ht 5' 5.51" (1.664 m)    Wt 56 kg    SpO2 95%    BMI 20.22 kg/m  Vital signs in last 24 hours: Temp:  [98.1 F (36.7 C)-99.6 F (37.6 C)] 98.1 F (36.7 C) (02/09 0753) Pulse Rate:  [93-105] 101 (02/09 0753) Resp:  [16-20] 16 (02/09 0753) BP: (103-119)/(66-81) 118/81 (02/09 0753) SpO2:  [92 %-96 %] 95 % (02/09 0753)  General: Cachectic looking woman, sitting somewhat uncomfortably in bed. HEENT: Normocephalic atraumatic CVS: Regular rhythm Respiratory: Breathing well saturating normally on room air with mild shortness of breath upon doing simple tasks. Extremities: Reduced muscle mass, no edema Abdomen nondistended nontender Neurological exam She is awake, appears a little tired but is able to cooperate with the exam. She is able to tell me the correct month, her full name and correct age. No evidence of aphasia. There is diminished attention concentration Cranial nerves: Pupils are equal round react light, extraocular movements appear unhindered, no visual field deficits, facial sensation and symmetry are preserved, tongue and palate midline.  Shoulder shrug intact. Motor examination: She has right grip strength weakness and mild drift in the right upper extremity.  She is also 4+/5 in the left  upper extremity.  She has a drift in the right lower extremity with 4/5 strength at best.  Left lower extremity is also 4-4+/5 at best. Sensation: Diminished on the right Coordination: No gross dysmetria   Medications  Current Facility-Administered Medications:    (feeding supplement) PROSource Plus liquid 30 mL, 30 mL, Oral, BID BM, Zhang, Dekui, MD   0.9 %  sodium chloride infusion, 250 mL, Intravenous, PRN, Schnier, Dolores Lory, MD   acetaminophen (TYLENOL) tablet 650 mg, 650 mg, Oral, Q4H PRN, Schnier, Dolores Lory, MD, 650 mg at 11/06/21 2019   ALPRAZolam (XANAX) tablet 0.5-1 mg, 0.5-1 mg, Oral, QHS PRN, Schnier, Dolores Lory, MD, 1 mg at 11/13/21 1357   fentaNYL (SUBLIMAZE) injection, , Intravenous, PRN, El-Abd, Joesph Fillers, MD, 50 mcg at 11/11/21 1515   FLUoxetine (PROZAC) capsule 40 mg, 40 mg, Oral, Daily, Schnier, Dolores Lory, MD, 40 mg at 11/14/21 0829   fluticasone (FLONASE) 50 MCG/ACT nasal spray 1 spray, 1 spray, Each Nare, Daily, Schnier, Dolores Lory, MD   gabapentin (NEURONTIN) capsule 300 mg, 300 mg, Oral, TID, Schnier, Dolores Lory, MD, 300 mg at 11/14/21 0829   heparin ADULT infusion 100 units/mL (25000 units/215mL), 1,000 Units/hr, Intravenous, Continuous, Beers, Shanon Brow, RPH, Last Rate: 10 mL/hr at 11/14/21 0758, 1,000 Units/hr at 11/14/21 0758   HYDROcodone-acetaminophen (NORCO/VICODIN) 5-325 MG per tablet 1 tablet, 1 tablet, Oral, Q4H PRN, Schnier, Dolores Lory, MD, 1 tablet at 11/11/21 1929   HYDROmorphone (DILAUDID) injection 0.5 mg,  0.5 mg, Intravenous, Q4H PRN, Schnier, Dolores Lory, MD, 0.5 mg at 11/14/21 6295   lamoTRIgine (LAMICTAL) tablet 200 mg, 200 mg, Oral, Daily, Schnier, Dolores Lory, MD, 200 mg at 11/14/21 2841   midazolam (VERSED) 5 MG/5ML injection, , Intravenous, PRN, El-Abd, Joesph Fillers, MD, 1 mg at 11/11/21 1515   multivitamin with minerals tablet 1 tablet, 1 tablet, Oral, Daily, Schnier, Dolores Lory, MD, 1 tablet at 11/14/21 0829   ondansetron (ZOFRAN) injection 4 mg, 4 mg,  Intravenous, Q6H PRN, Schnier, Dolores Lory, MD, 4 mg at 11/12/21 1100   oxyCODONE (Oxy IR/ROXICODONE) immediate release tablet 5 mg, 5 mg, Oral, Q4H PRN, Schnier, Dolores Lory, MD, 5 mg at 11/13/21 0600   pantoprazole (PROTONIX) EC tablet 40 mg, 40 mg, Oral, Daily, Schnier, Dolores Lory, MD, 40 mg at 11/14/21 0829   polyethylene glycol (MIRALAX / GLYCOLAX) packet 17 g, 17 g, Oral, Daily, Schnier, Dolores Lory, MD, 17 g at 11/14/21 0830   pravastatin (PRAVACHOL) tablet 40 mg, 40 mg, Oral, QHS, Schnier, Dolores Lory, MD, 40 mg at 11/13/21 2305   senna-docusate (Senokot-S) tablet 2 tablet, 2 tablet, Oral, BID, Schnier, Dolores Lory, MD, 2 tablet at 11/14/21 0829   sodium chloride flush (NS) 0.9 % injection 3 mL, 3 mL, Intravenous, Q12H, Schnier, Dolores Lory, MD, 3 mL at 11/14/21 0830   sodium chloride flush (NS) 0.9 % injection 3 mL, 3 mL, Intravenous, PRN, Katha Cabal, MD Labs CBC    Component Value Date/Time   WBC 20.1 (H) 11/14/2021 0510   RBC 3.23 (L) 11/14/2021 0510   HGB 9.6 (L) 11/14/2021 0510   HGB 13.5 03/17/2012 1159   HCT 29.7 (L) 11/14/2021 0510   HCT 40.0 03/17/2012 1159   PLT 93 (L) 11/14/2021 0510   PLT 290 03/17/2012 1159   MCV 92.0 11/14/2021 0510   MCV 94 03/17/2012 1159   MCH 29.7 11/14/2021 0510   MCHC 32.3 11/14/2021 0510   RDW 13.6 11/14/2021 0510   RDW 12.1 03/17/2012 1159   LYMPHSABS 1.1 11/06/2021 1257   LYMPHSABS 1.5 03/17/2012 1159   MONOABS 0.7 11/06/2021 1257   MONOABS 0.3 03/17/2012 1159   EOSABS 0.3 11/06/2021 1257   EOSABS 0.1 03/17/2012 1159   BASOSABS 0.1 11/06/2021 1257   BASOSABS 0.0 03/17/2012 1159    CMP     Component Value Date/Time   NA 132 (L) 11/14/2021 0510   K 4.3 11/14/2021 0510   CL 96 (L) 11/14/2021 0510   CO2 25 11/14/2021 0510   GLUCOSE 138 (H) 11/14/2021 0510   BUN 35 (H) 11/14/2021 0510   CREATININE 1.10 (H) 11/14/2021 0510   CALCIUM 9.0 11/14/2021 0510   PROT 8.1 11/06/2021 1257   ALBUMIN 4.0 11/06/2021 1257   AST 34 11/06/2021  1257   ALT 20 11/06/2021 1257   ALKPHOS 196 (H) 11/06/2021 1257   BILITOT 0.8 11/06/2021 1257   GFRNONAA 55 (L) 11/14/2021 0510    glycosylated hemoglobin-5.0  Lipid Panel LDL 75    Component Value Date/Time   CHOL 147 11/10/2021 0441   TRIG 135 11/10/2021 0441   HDL 45 11/10/2021 0441   CHOLHDL 3.3 11/10/2021 0441   VLDL 27 11/10/2021 0441   LDLCALC 75 11/10/2021 0441   2D echo February 2023-EF 60 to 32%, grade 1 diastolic dysfunction, focal thickening of MV leaflet, aortic valve normal, left atrium normal, no atrial level shunt detected by color Doppler flow.  Imaging I have reviewed images in epic and the results pertinent to this  consultation are: MRI brain on 11/09/2021: 2 round peripherally enhancing lesions in the left frontal lobe concerning for metastatic disease.  Numerous additional punctate and linear areas of enhancement also seen some of which may represent additional metastatic lesion but some of which might be enhancing subacute infarct.  Numerous foci of restricted diffusion in bilateral cerebral and cerebellar hemispheres, which demonstrate ADC correlates and some that have normalized ADC values concerning for acute to subacute embolic infarcts given multiple territories.  Abnormal enhancement of the C1-2 in the right occipital calvarium concerning for osseous metastatic disease. MRI of the brain repeated after worsening of her weakness yesterday 11/13/2021-with and without contrast that shows multiple small acute cerebral and cerebellar infarcts which are new from 11/09/2021.  Numerous foci of enhancement throughout the cerebral hemispheres bilaterally with very mild mixed interval changes since 11/09/2021-some of them have appearance most suggestive of subacute embolic infarctions while others are more suspicious for metastatic disease especially those showing ring enhancement in the inferior left frontal lobe and right temporal lobe.  Short-term imaging follow-up recommended for  better differentiation of metastatic and subacute strokes. Unchanged osseous metastatic disease on this MRI.   Assessment: 66 year old woman who was diagnosed with DVT treated with DOAC presented with shortness of breath and was found to have a saddle PE along with multiple PEs.  Reported dizziness and on exam had some apraxia and was found to have acute and subacute infarcts along with metastatic lesions in her brain.  She was doing well and couple of days into admission, had worsening of ability to walk for which another MRI was repeated which showed numerous new acute infarcts in bilateral cerebral and cerebellar hemispheres concerning for an embolic etiology. 2D echo unremarkable. 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 Worsening exam with a new subacute strokes likely due to strokes from hypercoagulability. Was on DOAC which likely is not effective in her case. Due to the DVT/PE, she needs to be on anticoagulation, which is sufficient for stroke prevention presuming that this is hypercoagulability.  Even if there is LV thrombus, treatment still is anticoagulation which she will be on. Choice of anticoagulant and hypercoagulable states.  Prevention is Lovenox. That said, given her multifocal metastatic disease, I am not sure of what her prognosis is-I will defer those discussions with oncology.  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 -Lovenox would be preferred for anticoagulation-risks and benefits discussed with the family Specifically-the fact that she has had multiple strokes of  different ages as well as has brain metastases puts her at very high risk for the chances of intracranial hemorrhage but not being on anticoagulation could be detrimental because of venous thrombosis, pulmonary embolism as well as new strokes in addition to any other potential thromboembolic events. -Continue home statin pravastatin 40-she is nearly at goal with LDL at 75. -This was discussed in detail with Dr. Roosevelt Locks, patient and family member at bedside. -The patient and family had questions about whole brain radiation-given her frailty, multifocal metastases, recent acute and subacute strokes, I am not sure if I am qualified enough to comment on that and I have recommended that they speak with oncology in detail about  the risks and benefits of treatment versus pursuing measures that focus more on her comfort.  Dr. Roosevelt Locks is going to lead and coordinate that conversation going forward.  Appreciate his care of the patient.  Appreciate hematology/oncology recommendations and care.   -- Amie Portland, MD Neurologist [facility] Pager: [phone]

## 2021-11-14 NOTE — Consult Note (Signed)
[place] at [facility] [phone] [phone]   Name: Cheryl Schroeder Date: 11/14/2021 MRN: [id number]  DOB: Oct 15, 1955  Patient Care Team: Lynnell Jude, MD as PCP - General (Family Medicine) Telford Nab, RN as Oncology Nurse Navigator    REASON FOR CONSULTATION: Cheryl Schroeder is a 66 y.o. female with multiple medical problems including history of DVT on Eliquis, fibromyalgia, bipolar disorder, chronic peripheral neuropathy, who was admitted to the hospital on 11/06/2021 with an acute saddle PE of the left main pulmonary artery.  CT of the chest also revealed a large mass obstructing the right upper lobe and partially obstructing the right middle lobe and skeletal lesions concerning for metastatic lung cancer.  Patient underwent mechanical thrombectomy and IVC filter placement on 11/08/2021.  Patient is status post right-sided thoracentesis.  MRI of the brain was also concerning for metastatic disease and subacute infarcts.  Unfortunately, patient developed new onset right arm weakness on 11/13/2021 with repeat MRI of the brain showing new embolic strokes.  Palliative care was consulted help address goals  SOCIAL HISTORY:     reports that she has never smoked. She has never used smokeless tobacco. She reports that she does not drink alcohol and does not use drugs.  Patient is unmarried and has no children.  She lives at home as a caregiver of her 40 year old mother.  She has 2 sisters who are involved.  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  She worked in Aeronautical engineer, Software engineer, and for [employer] and [employer].  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 to malignancy   Acute pulmonary embolism ([place]) 11/06/2021   Bipolar 2 disorder (HCC)    Body mass index (BMI) 19.9 or less,  adult 11/09/2021   CVA (cerebral vascular accident) ([place]) 11/09/2021   DVT of leg (deep venous thrombosis) ([place]) 11/09/2021   Dysplastic nevus    R calf, txted in past, pt states ~2002   Fibromyalgia    Psoriasis    Thyroid nodule 11/09/2021    PAST SURGICAL HISTORY:  Past Surgical History:  Procedure Laterality Date   ABDOMINAL HYSTERECTOMY     ADENOIDECTOMY     BREAST BIOPSY Right 1990   cyst   PULMONARY THROMBECTOMY Left 11/08/2021   Procedure: PULMONARY THROMBECTOMY;  Surgeon: Katha Cabal, MD;  Location: [facility];  Service: Cardiovascular;  Laterality: Left;   TONSILLECTOMY      HEMATOLOGY/ONCOLOGY HISTORY:  Oncology History   No history exists.    ALLERGIES:  is allergic to sulfa antibiotics.  MEDICATIONS:  Current Facility-Administered Medications  Medication Dose Route Frequency Provider Last Rate Last Admin   (feeding supplement) PROSource Plus liquid 30 mL  30 mL Oral BID BM Sharen Hones, MD   30 mL at 11/14/21 1338   0.9 %  sodium chloride infusion  250 mL Intravenous PRN Schnier, Dolores Lory, MD       acetaminophen (TYLENOL) tablet 650 mg  650 mg Oral Q4H PRN Schnier, Dolores Lory, MD   650 mg at 11/06/21 2019   ALPRAZolam Duanne Moron) tablet 0.5-1 mg  0.5-1 mg Oral QHS PRN Schnier, Dolores Lory, MD   1 mg at 11/13/21 1357   argatroban 1 mg/mL infusion  1 mcg/kg/min Intravenous Continuous Beers, Shanon Brow, RPH       fentaNYL (SUBLIMAZE)  injection   Intravenous PRN El-Abd, Joesph Fillers, MD   50 mcg at 11/11/21 1515   FLUoxetine (PROZAC) capsule 40 mg  40 mg Oral Daily Schnier, Dolores Lory, MD   40 mg at 11/14/21 0829   fluticasone (FLONASE) 50 MCG/ACT nasal spray 1 spray  1 spray Each Nare Daily Schnier, Dolores Lory, MD       gabapentin (NEURONTIN) capsule 300 mg  300 mg Oral TID Katha Cabal, MD   300 mg at 11/14/21 1337   HYDROcodone-acetaminophen (NORCO/VICODIN) 5-325 MG per tablet 1 tablet  1 tablet Oral Q4H PRN Katha Cabal, MD   1 tablet at 11/11/21  1929   HYDROmorphone (DILAUDID) injection 0.5 mg  0.5 mg Intravenous Q4H PRN Schnier, Dolores Lory, MD   0.5 mg at 11/14/21 1336   lamoTRIgine (LAMICTAL) tablet 200 mg  200 mg Oral Daily Schnier, Dolores Lory, MD   200 mg at 11/14/21 7169   midazolam (VERSED) 5 MG/5ML injection   Intravenous PRN Juliet Rude, MD   1 mg at 11/11/21 1515   multivitamin with minerals tablet 1 tablet  1 tablet Oral Daily Schnier, Dolores Lory, MD   1 tablet at 11/14/21 0829   ondansetron (ZOFRAN) injection 4 mg  4 mg Intravenous Q6H PRN Katha Cabal, MD   4 mg at 11/14/21 1109   oxyCODONE (Oxy IR/ROXICODONE) immediate release tablet 5 mg  5 mg Oral Q4H PRN Schnier, Dolores Lory, MD   5 mg at 11/14/21 1019   pantoprazole (PROTONIX) EC tablet 40 mg  40 mg Oral Daily Schnier, Dolores Lory, MD   40 mg at 11/14/21 0829   polyethylene glycol (MIRALAX / GLYCOLAX) packet 17 g  17 g Oral Daily Katha Cabal, MD   17 g at 11/14/21 0830   pravastatin (PRAVACHOL) tablet 40 mg  40 mg Oral QHS Schnier, Dolores Lory, MD   40 mg at 11/13/21 2305   senna-docusate (Senokot-S) tablet 2 tablet  2 tablet Oral BID Katha Cabal, MD   2 tablet at 11/14/21 0829   sodium chloride flush (NS) 0.9 % injection 3 mL  3 mL Intravenous Q12H Schnier, Dolores Lory, MD   3 mL at 11/14/21 0830   sodium chloride flush (NS) 0.9 % injection 3 mL  3 mL Intravenous PRN Schnier, Dolores Lory, MD       tamsulosin (FLOMAX) capsule 0.4 mg  0.4 mg Oral QPC supper Sharen Hones, MD        VITAL SIGNS: BP 116/65 (BP Location: Right Arm)    Pulse 97    Temp 98.3 F (36.8 C) (Oral)    Resp 16    Ht 5' 5.51" (1.664 m)    Wt 123 lb 7.3 oz (56 kg)    SpO2 98%    BMI 20.22 kg/m  Filed Weights   11/07/21 0413 11/09/21 0500 11/11/21 0458  Weight: 125 lb 10.6 oz (57 kg) 120 lb 4.8 oz (54.6 kg) 123 lb 7.3 oz (56 kg)    Estimated body mass index is 20.22 kg/m as calculated from the following:   Height as of this encounter: 5' 5.51" (1.664 m).   Weight as of this encounter:  123 lb 7.3 oz (56 kg).  LABS: CBC:    Component Value Date/Time   WBC 20.1 (H) 11/14/2021 0510   HGB 9.6 (L) 11/14/2021 0510   HGB 13.5 03/17/2012 1159   HCT 29.7 (L) 11/14/2021 0510   HCT 40.0 03/17/2012 1159   PLT  93 (L) 11/14/2021 0510   PLT 290 03/17/2012 1159   MCV 92.0 11/14/2021 0510   MCV 94 03/17/2012 1159   NEUTROABS 7.8 (H) 11/06/2021 1257   NEUTROABS 2.3 03/17/2012 1159   LYMPHSABS 1.1 11/06/2021 1257   LYMPHSABS 1.5 03/17/2012 1159   MONOABS 0.7 11/06/2021 1257   MONOABS 0.3 03/17/2012 1159   EOSABS 0.3 11/06/2021 1257   EOSABS 0.1 03/17/2012 1159   BASOSABS 0.1 11/06/2021 1257   BASOSABS 0.0 03/17/2012 1159   Comprehensive Metabolic Panel:    Component Value Date/Time   NA 132 (L) 11/14/2021 0510   K 4.3 11/14/2021 0510   CL 96 (L) 11/14/2021 0510   CO2 25 11/14/2021 0510   BUN 35 (H) 11/14/2021 0510   CREATININE 1.10 (H) 11/14/2021 0510   GLUCOSE 138 (H) 11/14/2021 0510   CALCIUM 9.0 11/14/2021 0510   AST 34 11/06/2021 1257   ALT 20 11/06/2021 1257   ALKPHOS 196 (H) 11/06/2021 1257   BILITOT 0.8 11/06/2021 1257   PROT 8.1 11/06/2021 1257   ALBUMIN 4.0 11/06/2021 1257    RADIOGRAPHIC STUDIES: CT ANGIO HEAD NECK W WO CM  Result Date: 11/09/2021 CLINICAL DATA:  Stroke suspected EXAM: CT ANGIOGRAPHY HEAD AND NECK TECHNIQUE: Multidetector CT imaging of the head and neck was performed using the standard protocol during bolus administration of intravenous contrast. Multiplanar CT image reconstructions and MIPs were obtained to evaluate the vascular anatomy. Carotid stenosis measurements (when applicable) are obtained utilizing NASCET criteria, using the distal internal carotid diameter as the denominator. RADIATION DOSE REDUCTION: This exam was performed according to the departmental dose-optimization program which includes automated exposure control, adjustment of the mA and/or kV according to patient size and/or use of iterative reconstruction technique.  CONTRAST:  53mL OMNIPAQUE IOHEXOL 350 MG/ML SOLN COMPARISON:  No prior CT or CTA clinical correlation is made with MRI 11/09/2021; FINDINGS: CT HEAD FINDINGS Brain: No evidence of acute cortical infarction, hemorrhage, cerebral edema, mass, mass effect, or midline shift. No hydrocephalus or extra-axial fluid collection. Vascular: No hyperdense vessel. Skull: Normal. Negative for fracture or focal lesion. Sinuses/Orbits: No acute finding. Other: Fluid in the right mastoid air cells. CTA NECK FINDINGS Aortic arch: Standard branching. Imaged portion shows no evidence of aneurysm or dissection. No significant stenosis of the major arch vessel origins. Right carotid system: No evidence of dissection, stenosis (50% or greater) or occlusion. Left carotid system: No evidence of dissection, stenosis (50% or greater) or occlusion. Vertebral arteries: The left vertebral artery is patent from its origin to the skull base. The right vertebral artery is diminutive, likely congenitally given the size of the vertebral artery foramen, and is patent to the skull base. No evidence of dissection, occlusion, or hemodynamically significant stenosis (greater than 50%). Skeleton: Numerous lytic lesions in the cervical and imaged thoracic spine. Possible mild vertebral body height loss at C6. Other neck: Hypoenhancing lesion in the left thyroid lobe measures up to 2.2 cm. Upper chest: Redemonstrated saddle embolus in the left main pulmonary artery, extending into the branch vessels, which was better evaluated on the 11/06/2021 CTA. Slight decrease in the size of a right pleural effusion, with new trace left pleural effusion. New ground-glass and patchy opacities in the right upper lobe (series 9, images 234, 237, 275, and 305) and superior right lower lobe (series 9, image 310), with redemonstrated opacities in the left upper lobe (series 9, image 232) and medial right upper lobe (series 9, image 295). Partially imaged inferior right upper  lobe  mass. Review of the MIP images confirms the above findings CTA HEAD FINDINGS Anterior circulation: Both internal carotid arteries are patent to the termini, without significant stenosis. A1 segments patent. Normal anterior communicating artery. Anterior cerebral arteries are patent to their distal aspects. No M1 stenosis or occlusion. Normal MCA bifurcations. Distal MCA branches perfused and symmetric. Posterior circulation: The right vertebral artery is quite diminutive in the proximal V4 segment and further decreased in caliber after the takeoff of the PICA (series 9, image 129); while this may represent a degree of stenosis, it could also be due to normal branching of an already diminutive vessel. The dominant left vertebral artery is patent to the vertebrobasilar junction. Posterior inferior cerebral arteries patent bilaterally. Basilar patent to its distal aspect. Superior cerebellar arteries patent bilaterally. Patent right posterior communicating artery, with a hypoplastic right P1 and near fetal origin of the right PCA. The left P1 is patent, with a small patent left posterior communicating artery. PCAs perfused to their distal aspects without stenosis. Venous sinuses: As permitted by contrast timing, patent. Anatomic variants: Near fetal origin of the right PCA. Review of the MIP images confirms the above findings IMPRESSION: 1. Diminutive extracranial right vertebral artery, likely congenital, with further diminution of the intracranial right V4 after the takeoff of the right PICA, which may reflect a degree of stenosis and/or normal post-branching caliber of an already diminutive vessel. The left vertebral artery is dominant and patent. No intracranial large vessel occlusion or other hemodynamically significant stenosis. 2.  No hemodynamically significant stenosis in the neck. 3. Partially imaged right mid lung mass, with increased ground-glass opacities in the imaged right upper and lower lobes,  concerning for worsening multifocal infection. 4. Slightly decreased right pleural effusion, with new trace left pleural effusion. 5. Lytic lesions in the cervical spine, with possible mild vertebral body height loss in C6. 6. Hypoenhancing lesion in the left thyroid lobe, which measures up to 2.2 cm. If this has not previously been evaluated, a non-emergent ultrasound of the thyroid is recommended. (Reference: J Am Coll Radiol. 2015 Feb;12(2): 143-50) 7. Electronically Signed   By: Merilyn Baba M.D.   On: 11/09/2021 21:09   DG Chest 2 View  Result Date: 11/14/2021 CLINICAL DATA:  Pneumonia EXAM: CHEST - 2 VIEW COMPARISON:  Chest radiograph 11/07/2021 FINDINGS: Normal cardiac silhouette. RIGHT perihilar mass measuring 5 cm again noted. Mild atelectasis along the LEFT midlung fissure. Lytic lesion in the LEFT humerus. IMPRESSION: 1. No acute findings. 2. Large RIGHT perihilar mass 3. Lytic lesion in the LEFT humerus. Electronically Signed   By: Suzy Bouchard M.D.   On: 11/14/2021 08:32   DG Chest 2 View  Result Date: 10/22/2021 CLINICAL DATA:  Contusion of the rib on the left side. Left rib pain. EXAM: CHEST - 2 VIEW COMPARISON:  None. FINDINGS: The heart size and mediastinal contours are within normal limits. Soft tissue density mass with area of atelectasis in the right mid lung measuring approximately 3.0 x 4.9 cm. Bibasilar atelectasis. Hyperinflated lungs. No pleural effusion or pneumothorax. The visualized skeletal structures are unremarkable. IMPRESSION: 1. Soft tissue density mass in the right mid lung measuring at least 3.0 x 4.9 cm, it may represent pneumonia, loculated pleural effusion or pulmonary mass. Further evaluation with CT examination would be helpful. Follow-up to resolution is recommended. 2. Hyperinflated lungs concerning for COPD. Bibasilar atelectasis. No pleural effusion or pneumothorax. An attempt was made to reach out to the provider Dr. Clemmie Krill without response from our office  at  the time of dictation. Electronically Signed   By: Keane Police D.O.   On: 10/22/2021 16:07   DG Ribs Unilateral W/Chest Left  Result Date: 11/06/2021 CLINICAL DATA:  Fall, left rib pain EXAM: LEFT RIBS AND CHEST - 3+ VIEW COMPARISON:  Chest x-ray 10/22/2021 FINDINGS: No acute fracture identified in the left ribs. Cardiomediastinal silhouette is unchanged. Persistent ovoid masslike opacity adjacent to the fissure in the right lung measuring approximately 5 x 3.2 cm, grossly unchanged. Linear opacities in the left lower lung zone likely represent subsegmental atelectasis. Hyperinflated lungs with mildly prominent interstitial lung markings. No pleural effusion or pneumothorax visualized. IMPRESSION: 1. No left rib fractures identified. 2. Persistent and unchanged ovoid perifissural masslike opacity in the right lung. 3. COPD. Electronically Signed   By: Ofilia Neas M.D.   On: 11/06/2021 12:16   CT Angio Chest PE W and/or Wo Contrast  Result Date: 11/06/2021 CLINICAL DATA:  Chest pain, recent trauma EXAM: CT ANGIOGRAPHY CHEST WITH CONTRAST TECHNIQUE: Multidetector CT imaging of the chest was performed using the standard protocol during bolus administration of intravenous contrast. Multiplanar CT image reconstructions and MIPs were obtained to evaluate the vascular anatomy. RADIATION DOSE REDUCTION: This exam was performed according to the departmental dose-optimization program which includes automated exposure control, adjustment of the mA and/or kV according to patient size and/or use of iterative reconstruction technique. CONTRAST:  138mL OMNIPAQUE IOHEXOL 350 MG/ML SOLN COMPARISON:  Chest radiographs done earlier today FINDINGS: Cardiovascular: There is homogeneous enhancement in the thoracic aorta. There is mild ectasia of main pulmonary artery measuring 3.2 cm. There is saddle embolus in the left main pulmonary artery extending into the left upper lobe and left lower lobe branches. There are few small  filling defects in the subsegmental branches in the right middle lobe and right lower lobe. There are no imaging signs of right ventricular strain. Mediastinum/Nodes: No significant lymphadenopathy seen. There is 2.5 cm low-density lesion in the enlarged left lobe of thyroid. Lungs/Pleura: There is moderate right pleural effusion. There are small patchy infiltrates in the medial aspect of right upper lobe in the right upper lung fields. There is 5.2 x 4.1 cm homogeneous density inseparable from interlobar fissure in the anterior right parahilar region. There is narrowing of the lumen of the bronchus leading to this homogeneous opacity in the anteromedial right mid lung fields. Linear patchy infiltrates are seen in both lower lung fields, more so on the right side. In image 13 of series 6, there is 2 cm ground-glass density in the anterior left apex. There is moderate right pleural effusion. There is no significant left pleural effusion. There is no pneumothorax. Upper Abdomen: Fatty liver. Musculoskeletal: There are numerous lytic lesions of varying sizes in the left ribs, thoracic spine and lumbar spine. There is decrease in height of bodies of C6 vertebra. There is also decrease in height of bodies of T6, T7, T8 and T9 vertebrae. Review of the MIP images confirms the above findings. IMPRESSION: There is evidence of acute pulmonary embolism with small to moderate thrombus burden. There are no imaging signs of acute right ventricular strain. There is no evidence of thoracic aortic dissection. There is 4.8 cm homogeneous opacity in the anterior right mid lung fields. This lesion has to be considered primary malignant neoplasm until proven otherwise. There is narrowing of bronchus leading to this parahilar density. PET-CT and biopsy as warranted should be considered. There are numerous lytic lesions of varying sizes along with compression fractures  in the cervical and thoracic spine as described in the body of the  report consistent with extensive skeletal metastatic disease. Moderate right pleural effusion. There are ground-glass and patchy alveolar infiltrates in both lungs as described in the body of the report suggesting multifocal pneumonia. There is 2.5 cm low-density nodule in the left lobe of thyroid. When the patient's clinical condition permits, thyroid sonogram may be considered. Electronically Signed   By: Elmer Picker M.D.   On: 11/06/2021 15:21   MR BRAIN W WO CONTRAST  Result Date: 11/13/2021 CLINICAL DATA:  Brain metastases suspected. EXAM: MRI HEAD WITHOUT AND WITH CONTRAST TECHNIQUE: Multiplanar, multiecho pulse sequences of the brain and surrounding structures were obtained without and with intravenous contrast. CONTRAST:  43mL GADAVIST GADOBUTROL 1 MMOL/ML IV SOLN COMPARISON:  Head MRI 11/09/2021 FINDINGS: Brain: Compared to the recent prior MRI, there are multiple new small foci of cortical and subcortical restricted diffusion in both cerebral hemispheres primarily in the frontal lobes (with examples annotated with arrows on series 5), and there are also new small foci of restricted diffusion in both cerebellar hemispheres. These are nonenhancing and consistent with interval acute infarcts. Numerous other small foci of milder diffusion weighted signal abnormality are again seen throughout both cerebral hemispheres and cerebellum as described on the recent prior MRI, and multiple small foci of enhancement are again seen throughout both cerebral hemispheres, some of which correspond to some of the areas of mild diffusion weighted signal abnormality while many do not. These enhancing foci demonstrate mild mixed interval changes compared to the recent prior MRI, with some appearing slightly larger, others appearing slightly regressed, and others not as well seen due to motion artifact on the current examination. A 10 mm ring-enhancing lesion with hemosiderin deposition in the inferior left frontal  lobe is unchanged (series 18, image 86), as is a 6 mm ring-enhancing lesion in the inferior right temporal lobe (series 18, image 52). No midline shift or extra-axial fluid collection is evident. The ventricles and sulci are normal. There is a background of mild chronic small vessel ischemia in the cerebral white matter. Vascular: Hypoplastic, poorly visualized distal right vertebral artery more fully evaluated on recent CTA. Other major intracranial vascular flow voids are preserved. Skull and upper cervical spine: Multiple bone metastases in the cervical spine, more fully evaluated on today's separate dedicated spine MRI. Unchanged subcentimeter enhancing focus in the right occipital skull. Sinuses/Orbits: Unremarkable orbits.  Small right mastoid effusion. Other: None. IMPRESSION: 1. Multiple small acute cerebral and cerebellar infarcts which are new from 11/09/2021. 2. Numerous small foci of enhancement throughout both cerebral hemispheres with very mild mixed interval changes since 11/09/2021. Some of these have an appearance most suggestive of subacute embolic infarcts, while others are more suspicious for metastatic disease (specifically ring-enhancing lesions in the inferior left frontal lobe and right temporal lobe). Short interval imaging follow-up may be helpful for differentiation. 3. Known osseous metastatic disease. Electronically Signed   By: Logan Bores M.D.   On: 11/13/2021 16:18   MR BRAIN W WO CONTRAST  Result Date: 11/09/2021 CLINICAL DATA:  Weight loss, brain metastases suspected EXAM: MRI HEAD WITHOUT AND WITH CONTRAST TECHNIQUE: Multiplanar, multiecho pulse sequences of the brain and surrounding structures were obtained without and with intravenous contrast. CONTRAST:  47mL GADAVIST GADOBUTROL 1 MMOL/ML IV SOLN COMPARISON:  None. FINDINGS: Brain: Round, peripherally enhancing lesion in the left inferior frontal lobe, which measures up to 9 x 9 mm (series 18, image 17), associated  hemosiderin  deposition, concerning for a hemorrhagic metastasis. This area is associated with hemosiderin deposition. Peripherally enhancing lesion in the left frontal lobe measures up to 4 mm (series 18, image 127). Multiple additional enhancing foci are also seen, some of which correlate with areas of restricted diffusion, described below, while some appear more masslike. These enhancing foci are seen on series 18 on the following images: 136, 120, 117, 115 113, 111, 108, 106, 104, 95, 94, 93, 92, 90, 87, 84, 80, 78, 77, 76, 70, and 59. Scattered foci of increased signal on diffusion-weighted imaging throughout the bilateral cerebral and cerebellar hemispheres, some of which demonstrate ADC correlates; for example several cortical foci and white matter areas in the posterior right frontal lobe (series 5, image 32-34), in the left anterior frontal lobe (series 5, image 27-28), areas along the right insula (series 5, image 27), left caudate (series 5, image 28), and bilateral cerebellar hemispheres (series 5, image 12-14). Some foci of increased signal on diffusion-weighted imaging do not demonstrate ADC correlates, and may reflect subacute infarcts with normalization of the ADC. However, some of these correlate with foci of enhancement such as on series 18, image 104. No acute hemorrhage or midline shift. No hydrocephalus or extra-axial collection Vascular: Normal flow voids. Skull and upper cervical spine: Abnormal enhancement in the arch (series 18, image 15) and bilateral lateral masses of C1 (series 18, image 8 and 10), as well as the anterior aspect of C2 (series 18, image 1). Small enhancing focus in the right occipital calvarium (series 18, image 72 Sinuses/Orbits: Negative. Other: Fluid throughout the right mastoid air cells. IMPRESSION: 1. Two round, peripherally enhancing lesions in the left frontal lobe, concerning for metastatic disease. Numerous additional punctate and linear areas of enhancement are  also seen, some of which may represent additional metastatic lesions, but some of which may represent enhancing, subacute infarcts. 2. Numerous foci of restricted diffusion in the bilateral cerebral and cerebellar hemispheres, some of which demonstrate ADC correlates and some of which may have normalized ADC values. These are concerning for acute and subacute infarcts, likely embolic in etiology given multiple vascular territories. 3. Abnormal enhancement in C1, C2, and the right occipital calvarium, concerning for osseous metastatic disease. These results were called by telephone at the time of interpretation on 11/09/2021 at 3:58 am to provider [facility] [facility] , who verbally acknowledged these results. Electronically Signed   By: Merilyn Baba M.D.   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 Recent pulmonary embolism. EXAM: MRI CERVICAL SPINE WITHOUT AND WITH CONTRAST TECHNIQUE: Multiplanar and multiecho pulse sequences of the cervical spine, to include the craniocervical junction and cervicothoracic junction, were obtained without and with intravenous contrast. CONTRAST:  61mL GADAVIST GADOBUTROL 1 MMOL/ML IV SOLN COMPARISON:  Chest CT 05/26/2022. Cervical MRI 11/10/2008. Cranial MRI 11/09/2021 and today. FINDINGS: Alignment: Physiologic. Vertebrae: Multifocal osseous metastatic disease throughout the cervical and upper thoracic spine, involving all segments except C3. Probable associated pathologic fractures at C5, C6 and T3 without significant osseous retropulsion or epidural tumor. Probable necrotic metastasis with peripheral enhancement at the right T2 costovertebral junction, measuring up to 1.9 cm on image 2/9. Cord: Normal in signal and caliber. No abnormal intradural enhancement. 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 See separate MRI brain report.Bilateral vertebral artery flow voids. There is a complex enhancing mass  involving the left thyroid lobe, incompletely visualized this measures up to 2.8 cm on sagittal image 12/15. Disc levels: Relatively mild multilevel spondylosis with disc bulging and uncinate spurring greatest at C5-6 and C6-7. There are scattered facet degenerative changes. No evidence of cord compression. Mild multilevel foraminal narrowing, greatest on the left at C4-5 and on the right at C6-7. IMPRESSION: 1. Multifocal osseous metastatic disease within the visualized cervicothoracic spine with probable pathologic fractures at C5, C6 and T3. No cord compression. 2. Relatively mild multilevel spondylosis with scattered mild foraminal narrowing. 3. Right cerebellar vermian infarcts.  See separate MRI head report. 4. Complex enhancing left thyroid nodule. Given comorbidities, this may not be clinically significant, although could be further evaluated with thyroid ultrasound and biopsy as clinically warranted. Electronically Signed   By: Richardean Sale M.D.   On: 11/13/2021 15:59   CT ABDOMEN PELVIS W CONTRAST  Result Date: 11/06/2021 CLINICAL DATA:  Abdominal pain, acute, nonlocalized. Status post fall EXAM: CT ABDOMEN AND PELVIS WITH CONTRAST TECHNIQUE: Multidetector CT imaging of the abdomen and pelvis was performed using the standard protocol following bolus administration of intravenous contrast. RADIATION DOSE REDUCTION: This exam was performed according to the departmental dose-optimization program which includes automated exposure control, adjustment of the mA and/or kV according to patient size and/or use of iterative reconstruction technique. CONTRAST:  138mL OMNIPAQUE IOHEXOL 350 MG/ML SOLN COMPARISON:  None. FINDINGS: Lower chest: Right pleural effusion. Please see separately dictated CT angiography chest 11/06/2021. Ports and Devices: None. Liver: Not enlarged.  Several vague hypodense right hepatic lobe lesions measuring 0.9, 1.4, 0.7, 0.5 cm. No laceration or subcapsular hematoma. Biliary System: The gallbladder is otherwise unremarkable with no radio-opaque gallstones. No biliary ductal dilatation. Pancreas: Normal pancreatic contour. No main pancreatic duct dilatation. Spleen: Not enlarged. No focal lesion. No laceration, subcapsular hematoma, or vascular injury. Adrenal Glands: No nodularity bilaterally. Kidneys: Bilateral kidneys enhance symmetrically. No hydronephrosis. Subcentimeter hypodensities are too small characterize. A 1.1 cm vague wedge-shaped hypodensity along the left superior renal pole that likely represents a poorly defined lesion with a density of 45 Hounsfield units. No contusion, laceration, or subcapsular hematoma. No injury to the vascular structures or collecting systems. No hydroureter. The urinary bladder is unremarkable. Bowel: No small or large bowel wall thickening or dilatation. Non-visualization of the appendix. The appendix is not definitely identified with no inflammatory changes in the right lower quadrant to suggest acute appendicitis. Mesentery, Omentum, and Peritoneum: No simple free fluid ascites. No pneumoperitoneum. No hemoperitoneum. No mesenteric hematoma identified. No organized fluid collection. Pelvic Organs: Normal. Lymph Nodes: No abdominal, pelvic, inguinal lymphadenopathy. Vasculature: No abdominal aorta or iliac aneurysm. No active contrast extravasation or pseudoaneurysm. Musculoskeletal: No significant soft tissue hematoma. No acute pelvic fracture. No spinal fracture. Diffuse appendicular and axial skeleton lytic lesions. IMPRESSION: 1.  No acute traumatic injury to the abdomen, or pelvis. 2. No acute fracture or traumatic malalignment of the lumbar spine. 3. Several vague indeterminate hypodense right hepatic lobe lesions measuring 0.9, 1.4, 0.7, 0.5 cm. Findings could represent metastases. 4. Diffuse appendicular and  axial skeleton lytic metastases. 5. An indeterminate 1.1 cm vague slightly wedge-shaped hypodensity along the left superior renal pole. Finding could represent an underlying poorly defined lesion, small infarction, less likely focal pyelonephritis. 6. Please see separately dictated CT angiography chest 11/06/2021. Electronically Signed   By: Iven Finn M.D.   On: 11/06/2021 15:15   PERIPHERAL VASCULAR CATHETERIZATION  Result Date: 11/08/2021 See surgical note for result.  US BIOPSY (  LIVER)  Result Date: 11/11/2021 INDICATION: Liver lesion EXAM: Ultrasound-guided core needle biopsy of liver lesion in the right hepatic lobe MEDICATIONS: None. ANESTHESIA/SEDATION: Moderate (conscious) sedation was employed during this procedure. A total of Versed 1 mg and Fentanyl 50 mcg was administered intravenously by the radiology nurse. Total intra-service moderate Sedation Time: 10 minutes. The patient's level of consciousness and vital signs were monitored continuously by radiology nursing throughout the procedure under my direct supervision. FLUOROSCOPY: N/a COMPLICATIONS: None immediate. PROCEDURE: Informed written consent was obtained from the patient after a thorough discussion of the procedural risks, benefits and alternatives. All questions were addressed. Maximal Sterile Barrier Technique was utilized including caps, mask, sterile gowns, sterile gloves, sterile drape, hand hygiene and skin antiseptic. A timeout was performed prior to the initiation of the procedure. The patient was placed supine on the exam table. Limited ultrasound exam of the liver was performed, which again demonstrated an approximately 1.1 cm hypoechoic nodule in the right hepatic lobe, compatible with recent CT demonstrating multiple small liver lesions. Skin entry site was marked, and the overlying skin was prepped and draped in a standard sterile fashion. Local analgesia was obtained with 1% lidocaine. Under ultrasound guidance, a 17  gauge introducer needle was advanced towards the identified lesion in the right hepatic lobe. Subsequently, core needle biopsy was performed using an 18 gauge core biopsy device x4 passes. Specimens were submitted in formalin to pathology for further handling. Limited postprocedure imaging demonstrated a small hypoechoic area deep to the lesion, suggestive of a small intraparenchymal hematoma. Review of biopsy images demonstrated likely injury to a small portal vein branch. This area was found to be stable over a short observation period. No subcapsular hematoma or bleeding into the hepatorenal space was identified. A clean dressing was placed after manual hemostasis. The patient tolerated the procedure well. IMPRESSION: 1. Successful ultrasound-guided core needle biopsy of a 1.1 cm lesion in the right hepatic lobe. 2. Postprocedure images demonstrate a small intraparenchymal hematoma which was stable over a short observation period. Interventional radiology will follow up with this patient in the morning. IV heparin is to be held pending following day AM CBC demonstrating stable hemoglobin. Electronically Signed   By: Albin Felling M.D.   On: 11/11/2021 16:12   DG Chest Port 1 View  Result Date: 11/07/2021 CLINICAL DATA:  Status post thorenthesis EXAM: PORTABLE CHEST 1 VIEW COMPARISON:  Chest radiograph 10/22/2021. Chest CT November 06, 2021. FINDINGS: Similar versus mildly increased right midlung masslike opacity. There is some fluid tracking along the right minor fissure. No visible pneumothorax. No consolidation. Similar cardiomediastinal silhouette. IMPRESSION: 1. Similar versus mildly increased right midlung masslike opacity, further characterized on recent chest CT. There is some fluid tracking along the right minor fissure. 2. No visible pneumothorax status post thoracentesis. Electronically Signed   By: Margaretha Sheffield M.D.   On: 11/07/2021 14:43   ECHOCARDIOGRAM COMPLETE  Result Date: 11/07/2021     ECHOCARDIOGRAM REPORT   Patient Name:   LINCY BELLES Date of Exam: 11/07/2021 Medical Rec #:  [id number]      Height:       65.5 in Accession #:    [id number]     Weight:       125.7 lb Date of Birth:  1956/09/25      BSA:          1.633 m Patient Age:    5 years       BP:  124/80 mmHg Patient Gender: F              HR:           78 bpm. Exam Location:  [facility] Procedure: 2D Echo, Cardiac Doppler and Color Doppler Indications:     Pulmonary Embolism I26.09  History:         Patient has no prior history of Echocardiogram examinations.                  Pulmonary emboli, acute pulmonary embolism.  Sonographer:     Sherrie Sport Referring Phys:  [id number] Lequita Halt Diagnosing Phys: Donnelly Angelica IMPRESSIONS  1. Left ventricular ejection fraction, by estimation, is 60 to 65%. The left ventricle has normal function. The left ventricle has no regional wall motion abnormalities. Left ventricular diastolic parameters are consistent with Grade I diastolic dysfunction (impaired relaxation).  2. Right ventricular systolic function is normal. The right ventricular size is mildly enlarged. There is mildly elevated pulmonary artery systolic pressure. The estimated right ventricular systolic pressure is 74.9 mmHg.  3. Focal thickening of MV leaflet. Correlate clinically. . The mitral valve is degenerative. Mild mitral valve regurgitation. No evidence of mitral stenosis.  4. The aortic valve is normal in structure. Aortic valve regurgitation is not visualized. No aortic stenosis is present.  5. The inferior vena cava is normal in size with <50% respiratory variability, suggesting right atrial pressure of 8 mmHg. FINDINGS  Left Ventricle: Left ventricular ejection fraction, by estimation, is 60 to 65%. The left ventricle has normal function. The left ventricle has no regional wall motion abnormalities. The left ventricular internal cavity size was normal in size. There is  no left ventricular hypertrophy. Left ventricular diastolic  parameters are consistent with Grade I diastolic dysfunction (impaired relaxation). Right Ventricle: The right ventricular size is mildly enlarged. No increase in right ventricular wall thickness. Right ventricular systolic function is normal. There is mildly elevated pulmonary artery systolic pressure. The tricuspid regurgitant velocity is 2.81 m/s, and with an assumed right atrial pressure of 8 mmHg, the estimated right ventricular systolic pressure is 44.9 mmHg. Left Atrium: Left atrial size was normal in size. Right Atrium: Right atrial size was normal in size. Pericardium: There is no evidence of pericardial effusion. Mitral Valve: Focal thickening of MV leaflet. Correlate clinically. The mitral valve is degenerative in appearance. Mild mitral valve regurgitation. No evidence of mitral valve stenosis. MV peak gradient, 6.7 mmHg. The mean mitral valve gradient is 3.0 mmHg. Tricuspid Valve: The tricuspid valve is normal in structure. Tricuspid valve regurgitation is mild . No evidence of tricuspid stenosis. Aortic Valve: The aortic valve is normal in structure. Aortic valve regurgitation is not visualized. No aortic stenosis is present. Aortic valve mean gradient measures 3.0 mmHg. Aortic valve peak gradient measures 4.8 mmHg. Aortic valve area, by VTI measures 2.53 cm. Pulmonic Valve: The pulmonic valve was normal in structure. Pulmonic valve regurgitation is not visualized. No evidence of pulmonic stenosis. Aorta: The aortic root is normal in size and structure. Venous: The inferior vena cava is normal in size with less than 50% respiratory variability, suggesting right atrial pressure of 8 mmHg. IAS/Shunts: No atrial level shunt detected by color flow Doppler.  LEFT VENTRICLE PLAX 2D LVIDd:         3.70 cm   Diastology LVIDs:         2.30 cm   LV e' medial:    5.11 cm/s LV PW:  1.40 cm   LV E/e' medial:  15.9 LV IVS:        1.10 cm   LV e' lateral:   4.35 cm/s LVOT diam:     2.00 cm   LV E/e' lateral:  18.7 LV SV:         52 LV SV Index:   32 LVOT Area:     3.14 cm  RIGHT VENTRICLE RV Basal diam:  2.90 cm RV S prime:     13.10 cm/s TAPSE (M-mode): 2.1 cm LEFT ATRIUM             Index        RIGHT ATRIUM           Index LA diam:        2.50 cm 1.53 cm/m   RA Area:     14.40 cm LA Vol (A2C):   55.7 ml 34.11 ml/m  RA Volume:   34.30 ml  21.01 ml/m LA Vol (A4C):   50.6 ml 30.99 ml/m LA Biplane Vol: 53.7 ml 32.89 ml/m  AORTIC VALVE                    PULMONIC VALVE AV Area (Vmax):    2.69 cm     PV Vmax:        0.70 m/s AV Area (Vmean):   2.50 cm     PV Vmean:       47.133 cm/s AV Area (VTI):     2.53 cm     PV VTI:         0.128 m AV Vmax:           109.00 cm/s  PV Peak grad:   1.9 mmHg AV Vmean:          74.700 cm/s  PV Mean grad:   1.0 mmHg AV VTI:            0.206 m      RVOT Peak grad: 3 mmHg AV Peak Grad:      4.8 mmHg AV Mean Grad:      3.0 mmHg LVOT Vmax:         93.30 cm/s LVOT Vmean:        59.400 cm/s LVOT VTI:          0.166 m LVOT/AV VTI ratio: 0.81  AORTA Ao Root diam: 3.17 cm MITRAL VALVE                TRICUSPID VALVE MV Area (PHT): 3.17 cm     TR Peak grad:   31.6 mmHg MV Area VTI:   1.83 cm     TR Vmax:        281.00 cm/s MV Peak grad:  6.7 mmHg MV Mean grad:  3.0 mmHg     SHUNTS MV Vmax:       1.29 m/s     Systemic VTI:  0.17 m MV Vmean:      75.9 cm/s    Systemic Diam: 2.00 cm MV Decel Time: 239 msec     Pulmonic VTI:  0.158 m MV E velocity: 81.40 cm/s MV A velocity: 109.00 cm/s MV E/A ratio:  0.75 Donnelly Angelica Electronically signed by Donnelly Angelica Signature Date/Time: 11/07/2021/1:06:16 PM    Final    US Abdomen Limited RUQ (LIVER/GB)  Result Date: 11/13/2021 CLINICAL DATA:  Status post liver biopsy 11/11/2021. Evaluate for hepatic hematoma. EXAM: ULTRASOUND ABDOMEN LIMITED RIGHT UPPER QUADRANT COMPARISON:  Biopsy 11/11/2021.  CT 11/06/2021. FINDINGS: Gallbladder: No gallstones or wall thickening visualized. No sonographic Murphy sign noted by sonographer. Common bile duct: Diameter:  Normal, 2 mm. Liver: No evidence of intraparenchymal or perihepatic hematoma. Again identified are multiple liver lesions, including at up to 1.2 cm in the right liver lobe. These demonstrate hypoechogenicity with central echogenicity. Portal vein is patent on color Doppler imaging with normal direction of blood flow towards the liver. Other: None. IMPRESSION: No evidence of procedure related intraparenchymal or perihepatic hematoma. Multiple liver lesions, as before. These demonstrate a morphology which is suspicious for metastatic disease. Electronically Signed   By: Abigail Miyamoto M.D.   On: 11/13/2021 16:24   US THORACENTESIS ASP PLEURAL SPACE W/IMG GUIDE  Result Date: 11/07/2021 INDICATION: 66 year old with right lung mass and concern for metastatic disease. EXAM: ULTRASOUND GUIDED RIGHT THORACENTESIS MEDICATIONS: None. COMPLICATIONS: None immediate. PROCEDURE: An ultrasound guided thoracentesis was thoroughly discussed with the patient and questions answered. The benefits, risks, alternatives and complications were also discussed. The patient understands and wishes to proceed with the procedure. Written consent was obtained. Ultrasound was performed to localize and mark an adequate pocket of fluid in the right chest. The area was then prepped and draped in the normal sterile fashion. 1% Lidocaine was used for local anesthesia. Under ultrasound guidance a 6 Fr Safe-T-Centesis catheter was introduced. Thoracentesis was performed. The catheter was removed and a dressing applied. FINDINGS: A total of approximately 350 mL of amber colored fluid was removed. Samples were sent to the laboratory as requested by the clinical team. IMPRESSION: Successful ultrasound guided right thoracentesis yielding 350 mL of pleural fluid. Electronically Signed   By: Markus Daft M.D.   On: 11/07/2021 15:01    PERFORMANCE STATUS (ECOG) : 3 - Symptomatic, >50% confined to bed  Review of Systems Unless otherwise noted, a complete  review of systems is negative.  Physical Exam General: NAD Pulmonary: Unlabored Extremities: no edema, no joint deformities Skin: no rashes Neurological: Weakness but otherwise nonfocal  IMPRESSION: I had a lengthy conversation with patient and her sister, Eustaquio Maize.  Patient was able to easily describe to me the current work-up and recognizes that she has a stage IV, incurable cancer.  Her hospitalization has been complicated by new embolic strokes despite anticoagulation.  Patient has been seen in consultation by neurology and is now on heparin with plan to rotate to Lovenox.  Patient recognizes that she has high risk for hemorrhagic conversion on anticoagulation in light of brain mets.  She did want to explore the feasibility of whole brain radiation and I would recommend referral to radiation oncology to answer these questions.  Results of liver biopsy are pending.  Patient feels like she would want to explore cancer treatment options but wants to wait until a formal diagnosis prior to more thoroughly discussing her goals.  She understandably seems overwhelmed with the recent changes in her health.  Symptomatically, she has had some pain and nausea.  Pain seems better with as needed hydromorphone.  I will have the RN give a dose of Zofran.  Patient is appropriately DNR/DNI.  Patient's sister  Eustaquio Maize), says that they completed ACP documents earlier this hospitalization.  Eustaquio Maize says that she is patient's healthcare power of attorney.  Plan is for probable rehab.  Patient would greatly benefit from palliative care following.  PLAN: -Continue current scope of treatment -[place] conversation following final pathology -Agree with DNR/DNI -Palliative care to follow at rehab  Time Total: 60 minutes  Visit consisted of  counseling and education dealing with the complex and emotionally intense issues of symptom management and palliative care in the setting of serious and potentially life-threatening  illness.Greater than 50%  of this time was spent counseling and coordinating care related to the above assessment and plan.  Signed by: Altha Harm, PhD, NP-C

## 2021-11-14 NOTE — Progress Notes (Signed)
Hematology/Oncology Progress note [phone] [phone]     Patient Care Team: Lynnell Jude, MD as PCP - General (Family Medicine) Telford Nab, RN as Oncology Nurse Navigator   Name of the patient: Cheryl Schroeder  [id number]  [birth year]  Date of visit: 11/14/21   INTERVAL HISTORY-  11/09/2021, MRI brain with and without contrast showed 2 brain lesions suspicious for metastatic disease.  Numerous foci of lesions suspecting acute and subacute infarcts.  Osseous metastasis of C1 and C2.  And right occipital calvarium  11/11/2021, status post ultrasound guided liver biopsy.  Patient had a liver hematoma.  Anticoagulation is temporarily held until tomorrow. Patient was seen by neurology for stroke and completed a stroke work-up including echocardiogram, CT angiogram of the neck  11/13/2021 patient reports new onset of right upper extremity weakness since yesterday.  One of her sisters were at the bedside.  Neurology and I were notified.  Recommend MRI brain, MRI spine for further evaluation.  Patient was known to have 2 small brain metastatic lesions [ 39mm, 34mm], numerous additional punctate linear area of enhancement, may represent a metastasis or subacute infarcts.  She also had multiple acute and subacute infarct on MRI done on 11/09/2021.  No report of vasogenic edema, acute hemorrhage or midline shift on that MRI.  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  NGS pending..  Allergies  Allergen Reactions   Sulfa Antibiotics Nausea Only and Nausea And Vomiting    Patient Active Problem List   Diagnosis Date Noted   Palliative care encounter    Orthostatic hypotension 11/13/2021   Liver hematoma 11/11/2021   Liver metastases (HCC)    Pleural effusion    Demand ischemia ([place]) 11/09/2021   Acquired thrombophilia ([place])  11/09/2021   DVT of leg (deep venous thrombosis) ([place]) 11/09/2021   Thyroid nodule 11/09/2021   Renal lesion 11/09/2021   Brain mass 11/09/2021   CVA (cerebral vascular accident) ([place]) 11/09/2021   Body mass index (BMI) 19.9 or less, adult 11/09/2021   Malignant pleural effusion    Mass of right lung    Bone lesion    Liver lesion    Acute pulmonary embolism ([place]) 11/06/2021   Pulmonary emboli ([place]) 11/06/2021   Plantar fasciitis 07/09/2014     Past Medical History:  Diagnosis Date   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   Acute pulmonary embolism ([place]) 11/06/2021   Bipolar 2 disorder (HCC)    Body mass index (BMI) 19.9 or less, adult 11/09/2021   CVA (cerebral vascular accident) ([place]) 11/09/2021   DVT of leg (deep venous thrombosis) ([place]) 11/09/2021   Dysplastic nevus    R calf, txted in past, pt states ~2002   Fibromyalgia    Psoriasis    Thyroid nodule 11/09/2021     Past Surgical History:  Procedure Laterality Date   ABDOMINAL HYSTERECTOMY     ADENOIDECTOMY     BREAST BIOPSY Right 1990   cyst   PULMONARY THROMBECTOMY Left 11/08/2021   Procedure: PULMONARY THROMBECTOMY;  Surgeon: Katha Cabal, MD;  Location: [facility];  Service: Cardiovascular;  Laterality: Left;   TONSILLECTOMY      Social History   Socioeconomic History   Marital status: Single    Spouse name: Not on file   Number of children: Not on file   Years of education: Not on file  Highest education level: Not on file  Occupational History   Not on file  Tobacco Use   Smoking status: Never   Smokeless tobacco: Never  Vaping Use   Vaping Use: Never used  Substance and Sexual Activity   Alcohol use: No   Drug use: No   Sexual activity: Not on file  Other Topics Concern   Not on file  Social History Narrative   Not on file   Social Determinants of Health   Financial Resource Strain: Not on file  Food Insecurity: Not on file  Transportation  Needs: Not on file  Physical Activity: Not on file  Stress: Not on file  Social Connections: Not on file  Intimate Partner Violence: Not on file     Family History  Problem Relation Age of Onset   Breast cancer Maternal Aunt 72   Lung cancer Father      Current Facility-Administered Medications:    (feeding supplement) PROSource Plus liquid 30 mL, 30 mL, Oral, BID BM, Roosevelt Locks, Dekui, MD, 30 mL at 11/14/21 1338   0.9 %  sodium chloride infusion, 250 mL, Intravenous, PRN, Schnier, Dolores Lory, MD   acetaminophen (TYLENOL) tablet 650 mg, 650 mg, Oral, Q4H PRN, Schnier, Dolores Lory, MD, 650 mg at 11/06/21 2019   ALPRAZolam (XANAX) tablet 0.5-1 mg, 0.5-1 mg, Oral, QHS PRN, Schnier, Dolores Lory, MD, 1 mg at 11/13/21 1357   argatroban 1 mg/mL infusion, 1 mcg/kg/min, Intravenous, Continuous, Beers, Shanon Brow, RPH, Last Rate: 3.36 mL/hr at 11/14/21 1635, 1 mcg/kg/min at 11/14/21 1635   fentaNYL (SUBLIMAZE) injection, , Intravenous, PRN, El-Abd, Joesph Fillers, MD, 50 mcg at 11/11/21 1515   FLUoxetine (PROZAC) capsule 40 mg, 40 mg, Oral, Daily, Schnier, Dolores Lory, MD, 40 mg at 11/14/21 0829   fluticasone (FLONASE) 50 MCG/ACT nasal spray 1 spray, 1 spray, Each Nare, Daily, Schnier, Dolores Lory, MD   gabapentin (NEURONTIN) capsule 300 mg, 300 mg, Oral, TID, Schnier, Dolores Lory, MD, 300 mg at 11/14/21 1337   HYDROcodone-acetaminophen (NORCO/VICODIN) 5-325 MG per tablet 1 tablet, 1 tablet, Oral, Q4H PRN, Schnier, Dolores Lory, MD, 1 tablet at 11/11/21 1929   HYDROmorphone (DILAUDID) injection 0.5 mg, 0.5 mg, Intravenous, Q4H PRN, Schnier, Dolores Lory, MD, 0.5 mg at 11/14/21 1336   lamoTRIgine (LAMICTAL) tablet 200 mg, 200 mg, Oral, Daily, Schnier, Dolores Lory, MD, 200 mg at 11/14/21 0829   midazolam (VERSED) 5 MG/5ML injection, , Intravenous, PRN, El-Abd, Joesph Fillers, MD, 1 mg at 11/11/21 1515   multivitamin with minerals tablet 1 tablet, 1 tablet, Oral, Daily, Schnier, Dolores Lory, MD, 1 tablet at 11/14/21 0829   ondansetron  (ZOFRAN) injection 4 mg, 4 mg, Intravenous, Q6H PRN, Schnier, Dolores Lory, MD, 4 mg at 11/14/21 1109   oxyCODONE (Oxy IR/ROXICODONE) immediate release tablet 5 mg, 5 mg, Oral, Q4H PRN, Schnier, Dolores Lory, MD, 5 mg at 11/14/21 1642   pantoprazole (PROTONIX) EC tablet 40 mg, 40 mg, Oral, Daily, Schnier, Dolores Lory, MD, 40 mg at 11/14/21 0829   polyethylene glycol (MIRALAX / GLYCOLAX) packet 17 g, 17 g, Oral, Daily, Schnier, Dolores Lory, MD, 17 g at 11/14/21 0830   pravastatin (PRAVACHOL) tablet 40 mg, 40 mg, Oral, QHS, Schnier, Dolores Lory, MD, 40 mg at 11/13/21 2305   senna-docusate (Senokot-S) tablet 2 tablet, 2 tablet, Oral, BID, Schnier, Dolores Lory, MD, 2 tablet at 11/14/21 0829   sodium chloride flush (NS) 0.9 % injection 3 mL, 3 mL, Intravenous, Q12H, Schnier, Dolores Lory, MD, 3 mL at 11/14/21  0830   sodium chloride flush (NS) 0.9 % injection 3 mL, 3 mL, Intravenous, PRN, Schnier, Dolores Lory, MD   tamsulosin [facility]) capsule 0.4 mg, 0.4 mg, Oral, QPC supper, Sharen Hones, MD, 0.4 mg at 11/14/21 1642   Physical exam:  Vitals:   11/14/21 0439 11/14/21 0753 11/14/21 1132 11/14/21 1640  BP: 103/68 118/81 116/65 115/68  Pulse: (!) 105 (!) 101 97 95  Resp: 18 16 16 16   Temp: 99.6 F (37.6 C) 98.1 F (36.7 C) 98.3 F (36.8 C) 98.5 F (36.9 C)  TempSrc: Oral Oral Oral Oral  SpO2:  95% 98% 100%  Weight:      Height:       Physical Exam Constitutional:      General: She is not in acute distress.    Appearance: She is not diaphoretic.     Comments: Frail appearance  HENT:     Head: Normocephalic and atraumatic.     Nose: Nose normal.     Mouth/Throat:     Pharynx: No oropharyngeal exudate.  Eyes:     General: No scleral icterus.    Pupils: Pupils are equal, round, and reactive to light.  Cardiovascular:     Rate and Rhythm: Normal rate.     Heart sounds: No murmur heard. Pulmonary:     Effort: Pulmonary effort is normal. No respiratory distress.     Breath sounds: No rales.  Chest:      Chest wall: No tenderness.  Abdominal:     General: There is no distension.  Musculoskeletal:        General: Normal range of motion.     Cervical back: Normal range of motion and neck supple.  Skin:    General: Skin is warm and dry.     Findings: No erythema.  Neurological:     Mental Status: She is alert and oriented to person, place, and time.     Cranial Nerves: No cranial nerve deficit.     Motor: No abnormal muscle tone.     Coordination: Coordination normal.     Comments: RUE strength, 3/5.    Psychiatric:        Mood and Affect: Affect normal.       CMP Latest Ref Rng & Units 11/14/2021  Glucose 70 - 99 mg/dL 138(H)  BUN 8 - 23 mg/dL 35(H)  Creatinine 0.44 - 1.00 mg/dL 1.10(H)  Sodium 135 - 145 mmol/L 132(L)  Potassium 3.5 - 5.1 mmol/L 4.3  Chloride 98 - 111 mmol/L 96(L)  CO2 22 - 32 mmol/L 25  Calcium 8.9 - 10.3 mg/dL 9.0  Total Protein 6.5 - 8.1 g/dL -  Total Bilirubin 0.3 - 1.2 mg/dL -  Alkaline Phos 38 - 126 U/L -  AST 15 - 41 U/L -  ALT 0 - 44 U/L -   CBC Latest Ref Rng & Units 11/14/2021  WBC 4.0 - 10.5 K/uL 20.1(H)  Hemoglobin 12.0 - 15.0 g/dL 9.6(L)  Hematocrit 36.0 - 46.0 % 29.7(L)  Platelets 150 - 400 K/uL 93(L)    RADIOGRAPHIC STUDIES: I have personally reviewed the radiological images as listed and agreed with the findings in the report. CT ANGIO HEAD NECK W WO CM  Result Date: 11/09/2021 CLINICAL DATA:  Stroke suspected EXAM: CT ANGIOGRAPHY HEAD AND NECK TECHNIQUE: Multidetector CT imaging of the head and neck was performed using the standard protocol during bolus administration of intravenous contrast. Multiplanar CT image reconstructions and MIPs were obtained to evaluate the vascular anatomy.  Carotid stenosis measurements (when applicable) are obtained utilizing NASCET criteria, using the distal internal carotid diameter as the denominator. RADIATION DOSE REDUCTION: This exam was performed according to the departmental dose-optimization program which  includes automated exposure control, adjustment of the mA and/or kV according to patient size and/or use of iterative reconstruction technique. CONTRAST:  18mL OMNIPAQUE IOHEXOL 350 MG/ML SOLN COMPARISON:  No prior CT or CTA clinical correlation is made with MRI 11/09/2021; FINDINGS: CT HEAD FINDINGS Brain: No evidence of acute cortical infarction, hemorrhage, cerebral edema, mass, mass effect, or midline shift. No hydrocephalus or extra-axial fluid collection. Vascular: No hyperdense vessel. Skull: Normal. Negative for fracture or focal lesion. Sinuses/Orbits: No acute finding. Other: Fluid in the right mastoid air cells. CTA NECK FINDINGS Aortic arch: Standard branching. Imaged portion shows no evidence of aneurysm or dissection. No significant stenosis of the major arch vessel origins. Right carotid system: No evidence of dissection, stenosis (50% or greater) or occlusion. Left carotid system: No evidence of dissection, stenosis (50% or greater) or occlusion. Vertebral arteries: The left vertebral artery is patent from its origin to the skull base. The right vertebral artery is diminutive, likely congenitally given the size of the vertebral artery foramen, and is patent to the skull base. No evidence of dissection, occlusion, or hemodynamically significant stenosis (greater than 50%). Skeleton: Numerous lytic lesions in the cervical and imaged thoracic spine. Possible mild vertebral body height loss at C6. Other neck: Hypoenhancing lesion in the left thyroid lobe measures up to 2.2 cm. Upper chest: Redemonstrated saddle embolus in the left main pulmonary artery, extending into the branch vessels, which was better evaluated on the 11/06/2021 CTA. Slight decrease in the size of a right pleural effusion, with new trace left pleural effusion. New ground-glass and patchy opacities in the right upper lobe (series 9, images 234, 237, 275, and 305) and superior right lower lobe (series 9, image 310), with redemonstrated  opacities in the left upper lobe (series 9, image 232) and medial right upper lobe (series 9, image 295). Partially imaged inferior right upper lobe mass. Review of the MIP images confirms the above findings CTA HEAD FINDINGS Anterior circulation: Both internal carotid arteries are patent to the termini, without significant stenosis. A1 segments patent. Normal anterior communicating artery. Anterior cerebral arteries are patent to their distal aspects. No M1 stenosis or occlusion. Normal MCA bifurcations. Distal MCA branches perfused and symmetric. Posterior circulation: The right vertebral artery is quite diminutive in the proximal V4 segment and further decreased in caliber after the takeoff of the PICA (series 9, image 129); while this may represent a degree of stenosis, it could also be due to normal branching of an already diminutive vessel. The dominant left vertebral artery is patent to the vertebrobasilar junction. Posterior inferior cerebral arteries patent bilaterally. Basilar patent to its distal aspect. Superior cerebellar arteries patent bilaterally. Patent right posterior communicating artery, with a hypoplastic right P1 and near fetal origin of the right PCA. The left P1 is patent, with a small patent left posterior communicating artery. PCAs perfused to their distal aspects without stenosis. Venous sinuses: As permitted by contrast timing, patent. Anatomic variants: Near fetal origin of the right PCA. Review of the MIP images confirms the above findings IMPRESSION: 1. Diminutive extracranial right vertebral artery, likely congenital, with further diminution of the intracranial right V4 after the takeoff of the right PICA, which may reflect a degree of stenosis and/or normal post-branching caliber of an already diminutive vessel. The left vertebral artery is  dominant and patent. No intracranial large vessel occlusion or other hemodynamically significant stenosis. 2.  No hemodynamically significant  stenosis in the neck. 3. Partially imaged right mid lung mass, with increased ground-glass opacities in the imaged right upper and lower lobes, concerning for worsening multifocal infection. 4. Slightly decreased right pleural effusion, with new trace left pleural effusion. 5. Lytic lesions in the cervical spine, with possible mild vertebral body height loss in C6. 6. Hypoenhancing lesion in the left thyroid lobe, which measures up to 2.2 cm. If this has not previously been evaluated, a non-emergent ultrasound of the thyroid is recommended. (Reference: J Am Coll Radiol. 2015 Feb;12(2): 143-50) 7. Electronically Signed   By: Merilyn Baba M.D.   On: 11/09/2021 21:09   DG Chest 2 View  Result Date: 11/14/2021 CLINICAL DATA:  Pneumonia EXAM: CHEST - 2 VIEW COMPARISON:  Chest radiograph 11/07/2021 FINDINGS: Normal cardiac silhouette. RIGHT perihilar mass measuring 5 cm again noted. Mild atelectasis along the LEFT midlung fissure. Lytic lesion in the LEFT humerus. IMPRESSION: 1. No acute findings. 2. Large RIGHT perihilar mass 3. Lytic lesion in the LEFT humerus. Electronically Signed   By: Suzy Bouchard M.D.   On: 11/14/2021 08:32   DG Chest 2 View  Result Date: 10/22/2021 CLINICAL DATA:  Contusion of the rib on the left side. Left rib pain. EXAM: CHEST - 2 VIEW COMPARISON:  None. FINDINGS: The heart size and mediastinal contours are within normal limits. Soft tissue density mass with area of atelectasis in the right mid lung measuring approximately 3.0 x 4.9 cm. Bibasilar atelectasis. Hyperinflated lungs. No pleural effusion or pneumothorax. The visualized skeletal structures are unremarkable. IMPRESSION: 1. Soft tissue density mass in the right mid lung measuring at least 3.0 x 4.9 cm, it may represent pneumonia, loculated pleural effusion or pulmonary mass. Further evaluation with CT examination would be helpful. Follow-up to resolution is recommended. 2. Hyperinflated lungs concerning for COPD. Bibasilar  atelectasis. No pleural effusion or pneumothorax. An attempt was made to reach out to the provider Dr. Clemmie Krill without response from our office at the time of dictation. Electronically Signed   By: Keane Police D.O.   On: 10/22/2021 16:07   DG Ribs Unilateral W/Chest Left  Result Date: 11/06/2021 CLINICAL DATA:  Fall, left rib pain EXAM: LEFT RIBS AND CHEST - 3+ VIEW COMPARISON:  Chest x-ray 10/22/2021 FINDINGS: No acute fracture identified in the left ribs. Cardiomediastinal silhouette is unchanged. Persistent ovoid masslike opacity adjacent to the fissure in the right lung measuring approximately 5 x 3.2 cm, grossly unchanged. Linear opacities in the left lower lung zone likely represent subsegmental atelectasis. Hyperinflated lungs with mildly prominent interstitial lung markings. No pleural effusion or pneumothorax visualized. IMPRESSION: 1. No left rib fractures identified. 2. Persistent and unchanged ovoid perifissural masslike opacity in the right lung. 3. COPD. Electronically Signed   By: Ofilia Neas M.D.   On: 11/06/2021 12:16   CT Angio Chest PE W and/or Wo Contrast  Result Date: 11/06/2021 CLINICAL DATA:  Chest pain, recent trauma EXAM: CT ANGIOGRAPHY CHEST WITH CONTRAST TECHNIQUE: Multidetector CT imaging of the chest was performed using the standard protocol during bolus administration of intravenous contrast. Multiplanar CT image reconstructions and MIPs were obtained to evaluate the vascular anatomy. RADIATION DOSE REDUCTION: This exam was performed according to the departmental dose-optimization program which includes automated exposure control, adjustment of the mA and/or kV according to patient size and/or use of iterative reconstruction technique. CONTRAST:  133mL OMNIPAQUE IOHEXOL 350 MG/ML  SOLN COMPARISON:  Chest radiographs done earlier today FINDINGS: Cardiovascular: There is homogeneous enhancement in the thoracic aorta. There is mild ectasia of main pulmonary artery measuring 3.2  cm. There is saddle embolus in the left main pulmonary artery extending into the left upper lobe and left lower lobe branches. There are few small filling defects in the subsegmental branches in the right middle lobe and right lower lobe. There are no imaging signs of right ventricular strain. Mediastinum/Nodes: No significant lymphadenopathy seen. There is 2.5 cm low-density lesion in the enlarged left lobe of thyroid. Lungs/Pleura: There is moderate right pleural effusion. There are small patchy infiltrates in the medial aspect of right upper lobe in the right upper lung fields. There is 5.2 x 4.1 cm homogeneous density inseparable from interlobar fissure in the anterior right parahilar region. There is narrowing of the lumen of the bronchus leading to this homogeneous opacity in the anteromedial right mid lung fields. Linear patchy infiltrates are seen in both lower lung fields, more so on the right side. In image 13 of series 6, there is 2 cm ground-glass density in the anterior left apex. There is moderate right pleural effusion. There is no significant left pleural effusion. There is no pneumothorax. Upper Abdomen: Fatty liver. Musculoskeletal: There are numerous lytic lesions of varying sizes in the left ribs, thoracic spine and lumbar spine. There is decrease in height of bodies of C6 vertebra. There is also decrease in height of bodies of T6, T7, T8 and T9 vertebrae. Review of the MIP images confirms the above findings. IMPRESSION: There is evidence of acute pulmonary embolism with small to moderate thrombus burden. There are no imaging signs of acute right ventricular strain. There is no evidence of thoracic aortic dissection. There is 4.8 cm homogeneous opacity in the anterior right mid lung fields. This lesion has to be considered primary malignant neoplasm until proven otherwise. There is narrowing of bronchus leading to this parahilar density. PET-CT and biopsy as warranted should be considered. There  are numerous lytic lesions of varying sizes along with compression fractures in the cervical and thoracic spine as described in the body of the report consistent with extensive skeletal metastatic disease. Moderate right pleural effusion. There are ground-glass and patchy alveolar infiltrates in both lungs as described in the body of the report suggesting multifocal pneumonia. There is 2.5 cm low-density nodule in the left lobe of thyroid. When the patient's clinical condition permits, thyroid sonogram may be considered. Electronically Signed   By: Elmer Picker M.D.   On: 11/06/2021 15:21   MR BRAIN W WO CONTRAST  Result Date: 11/13/2021 CLINICAL DATA:  Brain metastases suspected. EXAM: MRI HEAD WITHOUT AND WITH CONTRAST TECHNIQUE: Multiplanar, multiecho pulse sequences of the brain and surrounding structures were obtained without and with intravenous contrast. CONTRAST:  47mL GADAVIST GADOBUTROL 1 MMOL/ML IV SOLN COMPARISON:  Head MRI 11/09/2021 FINDINGS: Brain: Compared to the recent prior MRI, there are multiple new small foci of cortical and subcortical restricted diffusion in both cerebral hemispheres primarily in the frontal lobes (with examples annotated with arrows on series 5), and there are also new small foci of restricted diffusion in both cerebellar hemispheres. These are nonenhancing and consistent with interval acute infarcts. Numerous other small foci of milder diffusion weighted signal abnormality are again seen throughout both cerebral hemispheres and cerebellum as described on the recent prior MRI, and multiple small foci of enhancement are again seen throughout both cerebral hemispheres, some of which correspond to some of  the areas of mild diffusion weighted signal abnormality while many do not. These enhancing foci demonstrate mild mixed interval changes compared to the recent prior MRI, with some appearing slightly larger, others appearing slightly regressed, and others not as well  seen due to motion artifact on the current examination. A 10 mm ring-enhancing lesion with hemosiderin deposition in the inferior left frontal lobe is unchanged (series 18, image 86), as is a 6 mm ring-enhancing lesion in the inferior right temporal lobe (series 18, image 52). No midline shift or extra-axial fluid collection is evident. The ventricles and sulci are normal. There is a background of mild chronic small vessel ischemia in the cerebral white matter. Vascular: Hypoplastic, poorly visualized distal right vertebral artery more fully evaluated on recent CTA. Other major intracranial vascular flow voids are preserved. Skull and upper cervical spine: Multiple bone metastases in the cervical spine, more fully evaluated on today's separate dedicated spine MRI. Unchanged subcentimeter enhancing focus in the right occipital skull. Sinuses/Orbits: Unremarkable orbits.  Small right mastoid effusion. Other: None. IMPRESSION: 1. Multiple small acute cerebral and cerebellar infarcts which are new from 11/09/2021. 2. Numerous small foci of enhancement throughout both cerebral hemispheres with very mild mixed interval changes since 11/09/2021. Some of these have an appearance most suggestive of subacute embolic infarcts, while others are more suspicious for metastatic disease (specifically ring-enhancing lesions in the inferior left frontal lobe and right temporal lobe). Short interval imaging follow-up may be helpful for differentiation. 3. Known osseous metastatic disease. Electronically Signed   By: Logan Bores M.D.   On: 11/13/2021 16:18   MR BRAIN W WO CONTRAST  Result Date: 11/09/2021 CLINICAL DATA:  Weight loss, brain metastases suspected EXAM: MRI HEAD WITHOUT AND WITH CONTRAST TECHNIQUE: Multiplanar, multiecho pulse sequences of the brain and surrounding structures were obtained without and with intravenous contrast. CONTRAST:  72mL GADAVIST GADOBUTROL 1 MMOL/ML IV SOLN COMPARISON:  None. FINDINGS: Brain:  Round, peripherally enhancing lesion in the left inferior frontal lobe, which measures up to 9 x 9 mm (series 18, image 17), associated hemosiderin deposition, concerning for a hemorrhagic metastasis. This area is associated with hemosiderin deposition. Peripherally enhancing lesion in the left frontal lobe measures up to 4 mm (series 18, image 127). Multiple additional enhancing foci are also seen, some of which correlate with areas of restricted diffusion, described below, while some appear more masslike. These enhancing foci are seen on series 18 on the following images: 136, 120, 117, 115 113, 111, 108, 106, 104, 95, 94, 93, 92, 90, 87, 84, 80, 78, 77, 76, 70, and 59. Scattered foci of increased signal on diffusion-weighted imaging throughout the bilateral cerebral and cerebellar hemispheres, some of which demonstrate ADC correlates; for example several cortical foci and white matter areas in the posterior right frontal lobe (series 5, image 32-34), in the left anterior frontal lobe (series 5, image 27-28), areas along the right insula (series 5, image 27), left caudate (series 5, image 28), and bilateral cerebellar hemispheres (series 5, image 12-14). Some foci of increased signal on diffusion-weighted imaging do not demonstrate ADC correlates, and may reflect subacute infarcts with normalization of the ADC. However, some of these correlate with foci of enhancement such as on series 18, image 104. No acute hemorrhage or midline shift. No hydrocephalus or extra-axial collection Vascular: Normal flow voids. Skull and upper cervical spine: Abnormal enhancement in the arch (series 18, image 15) and bilateral lateral masses of C1 (series 18, image 8 and 10), as well as the  anterior aspect of C2 (series 18, image 1). Small enhancing focus in the right occipital calvarium (series 18, image 72 Sinuses/Orbits: Negative. Other: Fluid throughout the right mastoid air cells. IMPRESSION: 1. Two round, peripherally enhancing  lesions in the left frontal lobe, concerning for metastatic disease. Numerous additional punctate and linear areas of enhancement are also seen, some of which may represent additional metastatic lesions, but some of which may represent enhancing, subacute infarcts. 2. Numerous foci of restricted diffusion in the bilateral cerebral and cerebellar hemispheres, some of which demonstrate ADC correlates and some of which may have normalized ADC values. These are concerning for acute and subacute infarcts, likely embolic in etiology given multiple vascular territories. 3. Abnormal enhancement in C1, C2, and the right occipital calvarium, concerning for osseous metastatic disease. These results were called by telephone at the time of interpretation on 11/09/2021 at 3:58 am to provider [facility] , who verbally acknowledged these results. Electronically Signed   By: Merilyn Baba M.D.   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 Recent pulmonary embolism. EXAM: MRI CERVICAL SPINE WITHOUT AND WITH CONTRAST TECHNIQUE: Multiplanar and multiecho pulse sequences of the cervical spine, to include the craniocervical junction and cervicothoracic junction, were obtained without and with intravenous contrast. CONTRAST:  48mL GADAVIST GADOBUTROL 1 MMOL/ML IV SOLN COMPARISON:  Chest CT 05/26/2022. Cervical MRI 11/10/2008. Cranial MRI 11/09/2021 and today. FINDINGS: Alignment: Physiologic. Vertebrae: Multifocal osseous metastatic disease throughout the cervical and upper thoracic spine, involving all segments except C3. Probable associated pathologic fractures at C5, C6 and T3 without significant osseous retropulsion or epidural tumor. Probable necrotic metastasis with peripheral enhancement at the right T2 costovertebral junction, measuring up to 1.9 cm on image 2/9. Cord: Normal in signal and caliber. No abnormal  intradural enhancement. 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 See separate MRI brain report.Bilateral vertebral artery flow voids. There is a complex enhancing mass involving the left thyroid lobe, incompletely visualized this measures up to 2.8 cm on sagittal image 12/15. Disc levels: Relatively mild multilevel spondylosis with disc bulging and uncinate spurring greatest at C5-6 and C6-7. There are scattered facet degenerative changes. No evidence of cord compression. Mild multilevel foraminal narrowing, greatest on the left at C4-5 and on the right at C6-7. IMPRESSION: 1. Multifocal osseous metastatic disease within the visualized cervicothoracic spine with probable pathologic fractures at C5, C6 and T3. No cord compression. 2. Relatively mild multilevel spondylosis with scattered mild foraminal narrowing. 3. Right cerebellar vermian infarcts.  See separate MRI head report. 4. Complex enhancing left thyroid nodule. Given comorbidities, this may not be clinically significant, although could be further evaluated with thyroid ultrasound and biopsy as clinically warranted. Electronically Signed   By: Richardean Sale M.D.   On: 11/13/2021 15:59   CT ABDOMEN PELVIS W CONTRAST  Result Date: 11/06/2021 CLINICAL DATA:  Abdominal pain, acute, nonlocalized. Status post fall EXAM: CT ABDOMEN AND PELVIS WITH CONTRAST TECHNIQUE: Multidetector CT imaging of the abdomen and pelvis was performed using the standard protocol following bolus administration of intravenous contrast. RADIATION DOSE REDUCTION: This exam was performed according to the departmental dose-optimization program which includes automated exposure control, adjustment of the mA and/or kV according to patient size and/or use of iterative reconstruction technique. CONTRAST:  190mL OMNIPAQUE IOHEXOL 350 MG/ML SOLN COMPARISON:  None. FINDINGS: Lower chest:  Right  pleural effusion. Please see separately dictated CT angiography chest 11/06/2021. Ports and Devices: None. Liver: Not enlarged. Several vague hypodense right hepatic lobe lesions measuring 0.9, 1.4, 0.7, 0.5 cm. No laceration or subcapsular hematoma. Biliary System: The gallbladder is otherwise unremarkable with no radio-opaque gallstones. No biliary ductal dilatation. Pancreas: Normal pancreatic contour. No main pancreatic duct dilatation. Spleen: Not enlarged. No focal lesion. No laceration, subcapsular hematoma, or vascular injury. Adrenal Glands: No nodularity bilaterally. Kidneys: Bilateral kidneys enhance symmetrically. No hydronephrosis. Subcentimeter hypodensities are too small characterize. A 1.1 cm vague wedge-shaped hypodensity along the left superior renal pole that likely represents a poorly defined lesion with a density of 45 Hounsfield units. No contusion, laceration, or subcapsular hematoma. No injury to the vascular structures or collecting systems. No hydroureter. The urinary bladder is unremarkable. Bowel: No small or large bowel wall thickening or dilatation. Non-visualization of the appendix. The appendix is not definitely identified with no inflammatory changes in the right lower quadrant to suggest acute appendicitis. Mesentery, Omentum, and Peritoneum: No simple free fluid ascites. No pneumoperitoneum. No hemoperitoneum. No mesenteric hematoma identified. No organized fluid collection. Pelvic Organs: Normal. Lymph Nodes: No abdominal, pelvic, inguinal lymphadenopathy. Vasculature: No abdominal aorta or iliac aneurysm. No active contrast extravasation or pseudoaneurysm. Musculoskeletal: No significant soft tissue hematoma. No acute pelvic fracture. No spinal fracture. Diffuse appendicular and axial skeleton lytic lesions. IMPRESSION: 1.  No acute traumatic injury to the abdomen, or pelvis. 2. No acute fracture or traumatic malalignment of the lumbar spine. 3. Several vague indeterminate  hypodense right hepatic lobe lesions measuring 0.9, 1.4, 0.7, 0.5 cm. Findings could represent metastases. 4. 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 5. An indeterminate 1.1 cm vague slightly wedge-shaped hypodensity along the left superior renal pole. Finding could represent an underlying poorly defined lesion, small infarction, less likely focal pyelonephritis. 6. Please see separately dictated CT angiography chest 11/06/2021. Electronically Signed   By: Iven Finn M.D.   On: 11/06/2021 15:15   PERIPHERAL VASCULAR CATHETERIZATION  Result Date: 11/08/2021 See surgical note for result.  US BIOPSY (LIVER)  Result Date: 11/11/2021 INDICATION: Liver lesion EXAM: Ultrasound-guided core needle biopsy of liver lesion in the right hepatic lobe MEDICATIONS: None. ANESTHESIA/SEDATION: Moderate (conscious) sedation was employed during this procedure. A total of Versed 1 mg and Fentanyl 50 mcg was administered intravenously by the radiology nurse. Total intra-service moderate Sedation Time: 10 minutes. The patient's level of consciousness and vital signs were monitored continuously by radiology nursing throughout the procedure under my direct supervision. FLUOROSCOPY: N/a COMPLICATIONS: None immediate. PROCEDURE: Informed written consent was obtained from the patient after a thorough discussion of the procedural risks, benefits and alternatives. All questions were addressed. Maximal Sterile Barrier Technique was utilized including caps, mask, sterile gowns, sterile gloves, sterile drape, hand hygiene and skin antiseptic. A timeout was performed prior to the initiation of the procedure. The patient was placed supine on the exam table. Limited ultrasound exam of the liver was performed, which again demonstrated an approximately 1.1 cm hypoechoic nodule in the right hepatic lobe, compatible with recent CT demonstrating multiple small liver lesions. Skin entry site was marked, and the overlying skin  was prepped and draped in a standard sterile fashion. Local analgesia was obtained with 1% lidocaine. Under ultrasound guidance, a 17 gauge introducer needle was advanced towards the identified lesion in the right hepatic lobe. Subsequently, core needle biopsy was performed using an 18 gauge core biopsy device x4 passes. Specimens were submitted in formalin to pathology for  further handling. Limited postprocedure imaging demonstrated a small hypoechoic area deep to the lesion, suggestive of a small intraparenchymal hematoma. Review of biopsy images demonstrated likely injury to a small portal vein branch. This area was found to be stable over a short observation period. No subcapsular hematoma or bleeding into the hepatorenal space was identified. A clean dressing was placed after manual hemostasis. The patient tolerated the procedure well. IMPRESSION: 1. Successful ultrasound-guided core needle biopsy of a 1.1 cm lesion in the right hepatic lobe. 2. Postprocedure images demonstrate a small intraparenchymal hematoma which was stable over a short observation period. Interventional radiology will follow up with this patient in the morning. IV heparin is to be held pending following day AM CBC demonstrating stable hemoglobin. Electronically Signed   By: Albin Felling M.D.   On: 11/11/2021 16:12   DG Chest Port 1 View  Result Date: 11/07/2021 CLINICAL DATA:  Status post thorenthesis EXAM: PORTABLE CHEST 1 VIEW COMPARISON:  Chest radiograph 10/22/2021. Chest CT November 06, 2021. FINDINGS: Similar versus mildly increased right midlung masslike opacity. There is some fluid tracking along the right minor fissure. No visible pneumothorax. No consolidation. Similar cardiomediastinal silhouette. IMPRESSION: 1. Similar versus mildly increased right midlung masslike opacity, further characterized on recent chest CT. There is some fluid tracking along the right minor fissure. 2. No visible pneumothorax status post  thoracentesis. Electronically Signed   By: Margaretha Sheffield M.D.   On: 11/07/2021 14:43   ECHOCARDIOGRAM COMPLETE  Result Date: 11/07/2021    ECHOCARDIOGRAM REPORT   Patient Name:   CREOLA KROTZ Date of Exam: 11/07/2021 Medical Rec #:  [id number]      Height:       65.5 in Accession #:    [id number]     Weight:       125.7 lb Date of Birth:  09-26-1956      BSA:          1.633 m Patient Age:    17 years       BP:           124/80 mmHg Patient Gender: F              HR:           78 bpm. Exam Location:  [facility] Procedure: 2D Echo, Cardiac Doppler and Color Doppler Indications:     Pulmonary Embolism I26.09  History:         Patient has no prior history of Echocardiogram examinations.                  Pulmonary emboli, acute pulmonary embolism.  Sonographer:     Sherrie Sport Referring Phys:  [id number] Lequita Halt Diagnosing Phys: Donnelly Angelica IMPRESSIONS  1. Left ventricular ejection fraction, by estimation, is 60 to 65%. The left ventricle has normal function. The left ventricle has no regional wall motion abnormalities. Left ventricular diastolic parameters are consistent with Grade I diastolic dysfunction (impaired relaxation).  2. Right ventricular systolic function is normal. The right ventricular size is mildly enlarged. There is mildly elevated pulmonary artery systolic pressure. The estimated right ventricular systolic pressure is 78.9 mmHg.  3. Focal thickening of MV leaflet. Correlate clinically. . The mitral valve is degenerative. Mild mitral valve regurgitation. No evidence of mitral stenosis.  4. The aortic valve is normal in structure. Aortic valve regurgitation is not visualized. No aortic stenosis is present.  5. The inferior vena cava is normal in size with <50% respiratory variability,  suggesting right atrial pressure of 8 mmHg. FINDINGS  Left Ventricle: Left ventricular ejection fraction, by estimation, is 60 to 65%. The left ventricle has normal function. The left ventricle has no regional wall motion  abnormalities. The left ventricular internal cavity size was normal in size. There is  no left ventricular hypertrophy. Left ventricular diastolic parameters are consistent with Grade I diastolic dysfunction (impaired relaxation). Right Ventricle: The right ventricular size is mildly enlarged. No increase in right ventricular wall thickness. Right ventricular systolic function is normal. There is mildly elevated pulmonary artery systolic pressure. The tricuspid regurgitant velocity is 2.81 m/s, and with an assumed right atrial pressure of 8 mmHg, the estimated right ventricular systolic pressure is 78.2 mmHg. Left Atrium: Left atrial size was normal in size. Right Atrium: Right atrial size was normal in size. Pericardium: There is no evidence of pericardial effusion. Mitral Valve: Focal thickening of MV leaflet. Correlate clinically. The mitral valve is degenerative in appearance. Mild mitral valve regurgitation. No evidence of mitral valve stenosis. MV peak gradient, 6.7 mmHg. The mean mitral valve gradient is 3.0 mmHg. Tricuspid Valve: The tricuspid valve is normal in structure. Tricuspid valve regurgitation is mild . No evidence of tricuspid stenosis. Aortic Valve: The aortic valve is normal in structure. Aortic valve regurgitation is not visualized. No aortic stenosis is present. Aortic valve mean gradient measures 3.0 mmHg. Aortic valve peak gradient measures 4.8 mmHg. Aortic valve area, by VTI measures 2.53 cm. Pulmonic Valve: The pulmonic valve was normal in structure. Pulmonic valve regurgitation is not visualized. No evidence of pulmonic stenosis. Aorta: The aortic root is normal in size and structure. Venous: The inferior vena cava is normal in size with less than 50% respiratory variability, suggesting right atrial pressure of 8 mmHg. IAS/Shunts: No atrial level shunt detected by color flow Doppler.  LEFT VENTRICLE PLAX 2D LVIDd:         3.70 cm   Diastology LVIDs:         2.30 cm   LV e' medial:    5.11  cm/s LV PW:         1.40 cm   LV E/e' medial:  15.9 LV IVS:        1.10 cm   LV e' lateral:   4.35 cm/s LVOT diam:     2.00 cm   LV E/e' lateral: 18.7 LV SV:         52 LV SV Index:   32 LVOT Area:     3.14 cm  RIGHT VENTRICLE RV Basal diam:  2.90 cm RV S prime:     13.10 cm/s TAPSE (M-mode): 2.1 cm LEFT ATRIUM             Index        RIGHT ATRIUM           Index LA diam:        2.50 cm 1.53 cm/m   RA Area:     14.40 cm LA Vol (A2C):   55.7 ml 34.11 ml/m  RA Volume:   34.30 ml  21.01 ml/m LA Vol (A4C):   50.6 ml 30.99 ml/m LA Biplane Vol: 53.7 ml 32.89 ml/m  AORTIC VALVE                    PULMONIC VALVE AV Area (Vmax):    2.69 cm     PV Vmax:        0.70 m/s AV Area (Vmean):   2.50 cm  PV Vmean:       47.133 cm/s AV Area (VTI):     2.53 cm     PV VTI:         0.128 m AV Vmax:           109.00 cm/s  PV Peak grad:   1.9 mmHg AV Vmean:          74.700 cm/s  PV Mean grad:   1.0 mmHg AV VTI:            0.206 m      RVOT Peak grad: 3 mmHg AV Peak Grad:      4.8 mmHg AV Mean Grad:      3.0 mmHg LVOT Vmax:         93.30 cm/s LVOT Vmean:        59.400 cm/s LVOT VTI:          0.166 m LVOT/AV VTI ratio: 0.81  AORTA Ao Root diam: 3.17 cm MITRAL VALVE                TRICUSPID VALVE MV Area (PHT): 3.17 cm     TR Peak grad:   31.6 mmHg MV Area VTI:   1.83 cm     TR Vmax:        281.00 cm/s MV Peak grad:  6.7 mmHg MV Mean grad:  3.0 mmHg     SHUNTS MV Vmax:       1.29 m/s     Systemic VTI:  0.17 m MV Vmean:      75.9 cm/s    Systemic Diam: 2.00 cm MV Decel Time: 239 msec     Pulmonic VTI:  0.158 m MV E velocity: 81.40 cm/s MV A velocity: 109.00 cm/s MV E/A ratio:  0.75 Donnelly Angelica Electronically signed by Donnelly Angelica Signature Date/Time: 11/07/2021/1:06:16 PM    Final    US Abdomen Limited RUQ (LIVER/GB)  Result Date: 11/13/2021 CLINICAL DATA:  Status post liver biopsy 11/11/2021. Evaluate for hepatic hematoma. EXAM: ULTRASOUND ABDOMEN LIMITED RIGHT UPPER QUADRANT COMPARISON:  Biopsy 11/11/2021.  CT 11/06/2021.  FINDINGS: Gallbladder: No gallstones or wall thickening visualized. No sonographic Murphy sign noted by sonographer. Common bile duct: Diameter: Normal, 2 mm. Liver: No evidence of intraparenchymal or perihepatic hematoma. Again identified are multiple liver lesions, including at up to 1.2 cm in the right liver lobe. These demonstrate hypoechogenicity with central echogenicity. Portal vein is patent on color Doppler imaging with normal direction of blood flow towards the liver. Other: None. IMPRESSION: No evidence of procedure related intraparenchymal or perihepatic hematoma. Multiple liver lesions, as before. These demonstrate a morphology which is suspicious for metastatic disease. Electronically Signed   By: Abigail Miyamoto M.D.   On: 11/13/2021 16:24   US THORACENTESIS ASP PLEURAL SPACE W/IMG GUIDE  Result Date: 11/07/2021 INDICATION: 66 year old with right lung mass and concern for metastatic disease. EXAM: ULTRASOUND GUIDED RIGHT THORACENTESIS MEDICATIONS: None. COMPLICATIONS: None immediate. PROCEDURE: An ultrasound guided thoracentesis was thoroughly discussed with the patient and questions answered. The benefits, risks, alternatives and complications were also discussed. The patient understands and wishes to proceed with the procedure. Written consent was obtained. Ultrasound was performed to localize and mark an adequate pocket of fluid in the right chest. The area was then prepped and draped in the normal sterile fashion. 1% Lidocaine was used for local anesthesia. Under ultrasound guidance a 6 Fr Safe-T-Centesis catheter was introduced. Thoracentesis was performed. The catheter was removed and a dressing applied.  FINDINGS: A total of approximately 350 mL of amber colored fluid was removed. Samples were sent to the laboratory as requested by the clinical team. IMPRESSION: Successful ultrasound guided right thoracentesis yielding 350 mL of pleural fluid. Electronically Signed   By: Markus Daft M.D.   On:  11/07/2021 15:01    Assessment and plan-   Patient is a 66 y.o. female with known DVT on Eliquis presented with chest pain and unsteady gait.   work-up showed pulmonary embolism, right pleural effusion, lung mass with skeletal liver lesions, metastatic brain lesions and stroke.   #Thrombocytopenia, 4T score is high.  HIT antibody panel has been sent. Acute pulmonary embolism, lower extremity DVT, recurrent embolic stroke, hypercoagulable state secondary to extensive malignancy, recommend patient to continue anticoagulation. Discussed with Dr.Zhang and recommend switch anticoagulation to argatroban, whole awaiting HIT panel  #Stage IV lung cancer with liver, bone, brain metastasis.   Patient's Sister Vicente Males is at the bedside.  I have discussed with Sister Beth yesterday. I had a lengthy discussion with the patient and her Sister Vicente Males today.  She understands that her condition is not curable and overall prognosis is poor. Patient appears to get progressively weaker during the hospitalization.  NGS has been sent and may take about 2 weeks to result.  Prognosis may be better if patient has any targetable mutation.  A small proportion of patient may be treated with immunotherapy only if immunotherapy marker is above 50%.  The vast majority of patients will need chemotherapy+ immunotherapy and patient is not a candidate for aggressive chemotherapy. I discussed about option of continue current scope of care, wait for NGS results.  Option of initiating comfort care/hospice was also discussed with the patient.  Patient appreciates the discussion explanation.  She feels that she is not ready for initiate hospice/comfort care yet.  Plan is to continue current scope of care.  She understands that there is also at risk of catastrophic intracranial bleeding. CODE STATUS DNR/DNI.  #acute kidney failure, IV hydration.  #Leukocytosis, chest x-ray showed no pneumonia.  Likely reactive to multiple infarcts.  PT/OT.    Thank you for to allowing me to participate in the care of this patient.   Earlie Server, MD, PhD Hematology Oncology 11/14/2021

## 2021-11-14 NOTE — Progress Notes (Signed)
PT Cancellation Note  Patient Details Name: Cheryl Schroeder MRN: [id number] DOB: 08-Jan-1956   Cancelled Treatment:     Pt placed on medical hold at this time due to pt's significant risk for further injury due to multiple bone mets/lytic lesions with fractures in cervical and thoracic spine, left ribs, and lytic lesions found at L humerus. Pt is in significant pain. Through secure chat, Dr. Roosevelt Locks suggested to hold off at this time.  Nursing has been educated on safe handling of pt with recommendations for log rolling and no blood pressures taken on left UE.   [name] 11/14/2021, 11:15 AM

## 2021-11-14 NOTE — Progress Notes (Signed)
SLP Cancellation Note  Patient Details Name: Cheryl Schroeder MRN: [id number] DOB: December 29, 1955   Cancelled treatment:       Reason Eval/Treat Not Completed:  SLP consult received and appreciated. Chart review completed. SLP evaluation unable to be completed as pt is unavailable - currently with MD and RN.  Will continue efforts as appropriate.  Cherrie Gauze, M.S., [facility] [phone] ([place])   Quintella Baton 11/14/2021, 8:40 AM

## 2021-11-14 NOTE — Consult Note (Signed)
ANTICOAGULATION CONSULT NOTE   Pharmacy Consult for argatroban Indication: h/o DVT >> presenting w/ pulmonary embolus and NEW embolic stroke  Allergies  Allergen Reactions   Sulfa Antibiotics Nausea Only and Nausea And Vomiting    Patient Measurements: Height: 5' 5.51" (166.4 cm) Weight: 56 kg (123 lb 7.3 oz) IBW/kg (Calculated) : 58.18  Vital Signs: Temp: 99.6 F (37.6 C) (02/09 2025) Temp Source: Oral (02/09 2025) BP: 108/62 (02/09 2025) Pulse Rate: 108 (02/09 2025)  Labs: Recent Labs    11/12/21 0552 11/13/21 0609 11/14/21 0510 11/14/21 2004  HGB 10.4* 10.1* 9.6*  --   HCT 33.2* 32.1* 29.7*  --   PLT 143* 120* 93*  --   APTT  --   --   --  126*  HEPARINUNFRC 0.31  --   --   --   CREATININE  --  1.26* 1.10*  --      Estimated Creatinine Clearance: 44.5 mL/min (A) (by C-G formula based on SCr of 1.1 mg/dL (H)).   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  Assessment: patient is a 66 year old female recently diagnosed with a DVT about 3 weeks ago who is on Eliquis PTA now presents with some left upper abdominal pain and back pain for several weeks.  On 2/7, CT positive for pulmonary emboli.   On 2/8 PM, patient with new embolic stroke. MRI brain: "Compared to the recent prior MRI, there are multiple new small foci of cortical and subcortical restricted diffusion in both cerebral hemispheres primarily in the frontal lobes. Some of  these have an appearance most suggestive of subacute embolic infarcts"  On 2/9: Per d/w Heme/Onc and neuro will transition heparin to argatroban during HITT w/u.  Patient's 4T score of 5-6 points Intermediate/High risk (PLT >50% drop & Nadir >20 -- 2; Fall in 5-10d -- 2; progressive DVT>PE -- 1; TCP alternative causes likely/definite -- 1).   PF4 Ab ordered -- pending.   Goal of Therapy:  aPTT 50-90s seconds Monitor platelets by anticoagulation protocol: Yes   Plan:  aPTT supratherapeutic Discussed with RN - hold infusion for one hour Decrease argatrobaninfusion to 0.5 mcg/kg/min Check aPTT 2/10 at 0300 Continue to monitor CBC daily  Tawnya Crook, PharmD, BCPS Clinical Pharmacist 11/14/2021 9:19 PM

## 2021-11-14 NOTE — Progress Notes (Addendum)
PROGRESS NOTE    Cheryl Schroeder  GYF:[id number] DOB: 03-01-1956 DOA: 11/06/2021 PCP: Lynnell Jude, MD    Brief Narrative:  66 year old woman PMH recent outpatient diagnosis DVT treated with apixaban, presented with chest pain and frequent falls.  CT revealed acute saddle embolism and multiple PE as well as right middle lung mass and skeletal lesions concerning for malignancy.  Seen by pulmonology with recommendation for malignancy work-up. S/p right-sided thoracentesis. Seen by vascular surgery and underwent left pulmonary mechanical thrombectomy as well as placement of IVC.  Brain MRI concerning for metastatic disease as well as subacute infarcts.  Neurology consulted.  Awaiting cytology from thoracentesis.  Given need for anticoagulation, obtained liver biopsy prior to transition to oral anticoagulation. Patient had a new onset of right arm weakness on 2/8, MRI of the brain showed new embolic strokes.  Heparin drip started.  Assessment & Plan:   Principal Problem:   Acute pulmonary embolism (HCC) Active Problems:   Malignant pleural effusion   Mass of right lung   Bone lesion   Liver lesion   Demand ischemia (HCC)   Acquired thrombophilia (HCC)   DVT of leg (deep venous thrombosis) (HCC)   Thyroid nodule   Renal lesion   Brain mass   CVA (cerebral vascular accident) ([place])   Body mass index (BMI) 19.9 or less, adult   Liver hematoma   Liver metastases (HCC)   Pleural effusion   Orthostatic hypotension  New embolic stroke. Repeat MRI showed new embolic strokes.  Had extensive discussion with oncology and neurology, decision was made to change anticoagulation to heparin drip, then to Lovenox at the time of discharge.  Patient seems to be better today with the right arm.  Thrombocytopenia. Worsening leukocytosis. Anemia.  Patient platelet count was low 2/7, further decreased to 93 today.  Hematology/oncology is following, HIT is ordered. For now, will follow closely, may  consider DC heparin and transition to argatroban if HIT antibodies are positive. Leukocytosis did not appear to be infectious, chest x-ray no pneumonia.   Right lung mass. 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 Thyroid nodule. Renal lesion. Followed by oncology.     Orthostatic hypotension. Acute kidney injury. Hyperkalemia secondary to acute kidney injury. Hyponatremia. Potassium has normalized.  Renal function is getting better.  Sodium level 132.  Continue to follow.  Liver hematoma secondary to liver biopsy. Repeated right upper quadrant ultrasound did not show any hematoma, contusion has improved.   Acute pulm embolism. DVT. Acquired thrombophilia. Demand ischemia with elevated troponin secondary to PE. Heparin for now.  Malignant pleural effusion. S/p thoracentesis.  Urinary retention. Residual over 855ml, Foley catheter anchored, will start Flomax.  Goals of care discussion: Discussed with patient daughter together with the neurologist, patient long-term prognosis is very poor.  May need to consider palliative care/hospice after pathology from liver biopsy is reviewed by oncology.  Addendum: 1300. Spoke with hematology/oncology, will change to argatroban from heparin for concern of HIT.  DVT prophylaxis: Heparin Code Status: DNR Family Communication: Daughter updated Disposition Plan:    Status is: Inpatient  Remains inpatient appropriate because: Severity of disease      I/O last 3 completed shifts: In: 472.2 [I.V.:472.2] Out: -  Total I/O In: 240 [P.O.:240] Out: -      Consultants:  Neurology and oncology.  Procedures: Liver biopsy  Antimicrobials:None  Subjective: Patient is still feeling okay, she has poor appetite.  Has a left-sided abdominal pain, no nausea  vomiting. No fever or chills.  Objective: Vitals:   11/13/21 1654 11/13/21 2057 11/14/21 0439 11/14/21 0753  BP: 118/76 112/68 103/68  118/81  Pulse: (!) 101 (!) 104 (!) 105 (!) 101  Resp: 18 20 18 16   Temp: 99.2 F (37.3 C) 99.1 F (37.3 C) 99.6 F (37.6 C) 98.1 F (36.7 C)  TempSrc: Oral Oral Oral Oral  SpO2: 94% 92%  95%  Weight:      Height:        Intake/Output Summary (Last 24 hours) at 11/14/2021 1048 Last data filed at 11/14/2021 0900 Gross per 24 hour  Intake 712.19 ml  Output --  Net 712.19 ml   Filed Weights   11/07/21 0413 11/09/21 0500 11/11/21 0458  Weight: 57 kg 54.6 kg 56 kg    Examination:  General exam: Appears calm and comfortable  Respiratory system: Clear to auscultation. Respiratory effort normal. Cardiovascular system: S1 & S2 heard, RRR. No JVD, murmurs, rubs, gallops or clicks. No pedal edema. Gastrointestinal system: Abdomen is nondistended, soft and left tender. No organomegaly or masses felt. Normal bowel sounds heard. Central nervous system: Alert and oriented x3. No focal neurological deficits. Extremities: Symmetric 5 x 5 power. Skin: No rashes, lesions or ulcers Psychiatry: Judgement and insight appear normal. Mood & affect appropriate.     Data Reviewed: I have personally reviewed following labs and imaging studies  CBC: Recent Labs  Lab 11/10/21 0441 11/11/21 0551 11/12/21 0552 11/13/21 0609 11/14/21 0510  WBC 10.1 8.2 15.1* 14.6* 20.1*  HGB 10.5* 9.3* 10.4* 10.1* 9.6*  HCT 32.9* 29.2* 33.2* 32.1* 29.7*  MCV 92.2 93.3 94.1 93.0 92.0  PLT 249 235 143* 120* 93*   Basic Metabolic Panel: Recent Labs  Lab 11/08/21 0556 11/09/21 0219 11/10/21 0441 11/11/21 0551 11/13/21 0609 11/13/21 1221 11/14/21 0510  NA 138 136 135 140 136  --  132*  K 4.2 5.0 4.8 4.1 5.3* 4.4 4.3  CL 104 102 102 107 101  --  96*  CO2 27 28 28 30 26   --  25  GLUCOSE 92 112* 102* 103* 115*  --  138*  BUN 12 17 14 19  37*  --  35*  CREATININE 0.89 0.95 0.88 0.82 1.26*  --  1.10*  CALCIUM 9.4 9.0 9.3 9.0 9.6  --  9.0  MG 2.1  --   --   --  2.4  --  2.4  PHOS 4.2  --   --   --   --   --    --    GFR: Estimated Creatinine Clearance: 44.5 mL/min (A) (by C-G formula based on SCr of 1.1 mg/dL (H)). Liver Function Tests: No results for input(s): AST, ALT, ALKPHOS, BILITOT, PROT, ALBUMIN in the last 168 hours. No results for input(s): LIPASE, AMYLASE in the last 168 hours. No results for input(s): AMMONIA in the last 168 hours. Coagulation Profile: No results for input(s): INR, PROTIME in the last 168 hours. Cardiac Enzymes: No results for input(s): CKTOTAL, CKMB, CKMBINDEX, TROPONINI in the last 168 hours. BNP (last 3 results) No results for input(s): PROBNP in the last 8760 hours. HbA1C: No results for input(s): HGBA1C in the last 72 hours. CBG: No results for input(s): GLUCAP in the last 168 hours. Lipid Profile: No results for input(s): CHOL, HDL, LDLCALC, TRIG, CHOLHDL, LDLDIRECT in the last 72 hours. Thyroid Function Tests: No results for input(s): TSH, T4TOTAL, FREET4, T3FREE, THYROIDAB in the last 72 hours. Anemia Panel: Recent Labs  11/14/21 0902  FOLATE 15.7   Sepsis Labs: No results for input(s): PROCALCITON, LATICACIDVEN in the last 168 hours.  Recent Results (from the past 240 hour(s))  Resp Panel by RT-PCR (Flu A&B, Covid) Nasopharyngeal Swab     Status: None   Collection Time: 11/06/21  3:49 PM   Specimen: Nasopharyngeal Swab; Nasopharyngeal(NP) swabs in vial transport medium  Result Value Ref Range Status   SARS Coronavirus 2 by RT PCR NEGATIVE NEGATIVE Final    Comment: (NOTE) SARS-CoV-2 target nucleic acids are NOT DETECTED.  The SARS-CoV-2 RNA is generally detectable in upper respiratory specimens during the acute phase of infection. The lowest concentration of SARS-CoV-2 viral copies this assay can detect is 138 copies/mL. A negative result does not preclude SARS-Cov-2 infection and should not be used as the sole basis for treatment or other patient management decisions. A negative result may occur with  improper specimen  collection/handling, submission of specimen other than nasopharyngeal swab, presence of viral mutation(s) within the areas targeted by this assay, and inadequate number of viral copies(<138 copies/mL). A negative result must be combined with clinical observations, patient history, and epidemiological information. The expected result is Negative.  Fact Sheet for Patients:  EntrepreneurPulse.com.au  Fact Sheet for Healthcare Providers:  IncredibleEmployment.be  This test is no t yet approved or cleared by the [place] FDA and  has been authorized for detection and/or diagnosis of SARS-CoV-2 by FDA under an Emergency Use Authorization (EUA). This EUA will remain  in effect (meaning this test can be used) for the duration of the COVID-19 declaration under Section 564(b)(1) of the Act, 21 U.S.C.section 360bbb-3(b)(1), unless the authorization is terminated  or revoked sooner.       Influenza A by PCR NEGATIVE NEGATIVE Final   Influenza B by PCR NEGATIVE NEGATIVE Final    Comment: (NOTE) The Xpert Xpress SARS-CoV-2/FLU/RSV plus assay is intended as an aid in the diagnosis of influenza from Nasopharyngeal swab specimens and should not be used as a sole basis for treatment. Nasal washings and aspirates are unacceptable for Xpert Xpress SARS-CoV-2/FLU/RSV testing.  Fact Sheet for Patients: EntrepreneurPulse.com.au  Fact Sheet for Healthcare Providers: IncredibleEmployment.be  This test is not yet approved or cleared by the [place] FDA and has been authorized for detection and/or diagnosis of SARS-CoV-2 by FDA under an Emergency Use Authorization (EUA). This EUA will remain in effect (meaning this test can be used) for the duration of the COVID-19 declaration under Section 564(b)(1) of the Act, 21 U.S.C. section 360bbb-3(b)(1), unless the authorization is terminated or revoked.  Performed at [facility], [address] [place], [place] [address]   CULTURE, BLOOD (ROUTINE X 2) w Reflex to ID Panel     Status: None (Preliminary result)   Collection Time: 11/07/21  4:37 PM   Specimen: BLOOD  Result Value Ref Range Status   Specimen Description BLOOD LEFT ANTECUBITAL  Final   Special Requests   Final    BOTTLES DRAWN AEROBIC AND ANAEROBIC Blood Culture adequate volume   Culture   Final    NO GROWTH 4 DAYS Performed at [facility], [address] [place], [place] [address]    Report Status PENDING  Incomplete  CULTURE, BLOOD (ROUTINE X 2) w Reflex to ID Panel     Status: None (Preliminary result)   Collection Time: 11/07/21  4:38 PM   Specimen: BLOOD  Result Value Ref Range Status   Specimen Description BLOOD BLOOD LEFT ARM  Final  Special Requests   Final    BOTTLES DRAWN AEROBIC AND ANAEROBIC Blood Culture adequate volume   Culture   Final    NO GROWTH 4 DAYS Performed at [facility], [address] [place], [place] [address]    Report Status PENDING  Incomplete         Radiology Studies: DG Chest 2 View  Result Date: 11/14/2021 CLINICAL DATA:  Pneumonia EXAM: CHEST - 2 VIEW COMPARISON:  Chest radiograph 11/07/2021 FINDINGS: Normal cardiac silhouette. RIGHT perihilar mass measuring 5 cm again noted. Mild atelectasis along the LEFT midlung fissure. Lytic lesion in the LEFT humerus. IMPRESSION: 1. No acute findings. 2. Large RIGHT perihilar mass 3. Lytic lesion in the LEFT humerus. Electronically Signed   By: Suzy Bouchard M.D.   On: 11/14/2021 08:32   MR BRAIN W WO CONTRAST  Result Date: 11/13/2021 CLINICAL DATA:  Brain metastases suspected. EXAM: MRI HEAD WITHOUT AND WITH CONTRAST TECHNIQUE: Multiplanar, multiecho pulse sequences of the brain and surrounding structures were obtained without and with intravenous contrast. CONTRAST:  78mL GADAVIST GADOBUTROL 1 MMOL/ML IV SOLN COMPARISON:  Head MRI 11/09/2021 FINDINGS: Brain: Compared to the  recent prior MRI, there are multiple new small foci of cortical and subcortical restricted diffusion in both cerebral hemispheres primarily in the frontal lobes (with examples annotated with arrows on series 5), and there are also new small foci of restricted diffusion in both cerebellar hemispheres. These are nonenhancing and consistent with interval acute infarcts. Numerous other small foci of milder diffusion weighted signal abnormality are again seen throughout both cerebral hemispheres and cerebellum as described on the recent prior MRI, and multiple small foci of enhancement are again seen throughout both cerebral hemispheres, some of which correspond to some of the areas of mild diffusion weighted signal abnormality while many do not. These enhancing foci demonstrate mild mixed interval changes compared to the recent prior MRI, with some appearing slightly larger, others appearing slightly regressed, and others not as well seen due to motion artifact on the current examination. A 10 mm ring-enhancing lesion with hemosiderin deposition in the inferior left frontal lobe is unchanged (series 18, image 86), as is a 6 mm ring-enhancing lesion in the inferior right temporal lobe (series 18, image 52). No midline shift or extra-axial fluid collection is evident. The ventricles and sulci are normal. There is a background of mild chronic small vessel ischemia in the cerebral white matter. Vascular: Hypoplastic, poorly visualized distal right vertebral artery more fully evaluated on recent CTA. Other major intracranial vascular flow voids are preserved. Skull and upper cervical spine: Multiple bone metastases in the cervical spine, more fully evaluated on today's separate dedicated spine MRI. Unchanged subcentimeter enhancing focus in the right occipital skull. Sinuses/Orbits: Unremarkable orbits.  Small right mastoid effusion. Other: None. IMPRESSION: 1. Multiple small acute cerebral and cerebellar infarcts which are  new from 11/09/2021. 2. Numerous small foci of enhancement throughout both cerebral hemispheres with very mild mixed interval changes since 11/09/2021. Some of these have an appearance most suggestive of subacute embolic infarcts, while others are more suspicious for metastatic disease (specifically ring-enhancing lesions in the inferior left frontal lobe and right temporal lobe). Short interval imaging follow-up may be helpful for differentiation. 3. Known osseous metastatic disease. Electronically Signed   By: Logan Bores M.D.   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 Recent pulmonary embolism. EXAM: MRI  CERVICAL SPINE WITHOUT AND WITH CONTRAST TECHNIQUE: Multiplanar and multiecho pulse sequences of the cervical spine, to include the craniocervical junction and cervicothoracic junction, were obtained without and with intravenous contrast. CONTRAST:  58mL GADAVIST GADOBUTROL 1 MMOL/ML IV SOLN COMPARISON:  Chest CT 05/26/2022. Cervical MRI 11/10/2008. Cranial MRI 11/09/2021 and today. FINDINGS: Alignment: Physiologic. Vertebrae: Multifocal osseous metastatic disease throughout the cervical and upper thoracic spine, involving all segments except C3. Probable associated pathologic fractures at C5, C6 and T3 without significant osseous retropulsion or epidural tumor. Probable necrotic metastasis with peripheral enhancement at the right T2 costovertebral junction, measuring up to 1.9 cm on image 2/9. Cord: Normal in signal and caliber. No abnormal intradural enhancement. 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 See separate MRI brain report.Bilateral vertebral artery flow voids. There is a complex enhancing mass involving the left thyroid lobe, incompletely visualized this measures up to 2.8 cm  on sagittal image 12/15. Disc levels: Relatively mild multilevel spondylosis with disc bulging and uncinate spurring greatest at C5-6 and C6-7. There are scattered facet degenerative changes. No evidence of cord compression. Mild multilevel foraminal narrowing, greatest on the left at C4-5 and on the right at C6-7. IMPRESSION: 1. Multifocal osseous metastatic disease within the visualized cervicothoracic spine with probable pathologic fractures at C5, C6 and T3. No cord compression. 2. Relatively mild multilevel spondylosis with scattered mild foraminal narrowing. 3. Right cerebellar vermian infarcts.  See separate MRI head report. 4. Complex enhancing left thyroid nodule. Given comorbidities, this may not be clinically significant, although could be further evaluated with thyroid ultrasound and biopsy as clinically warranted. Electronically Signed   By: Richardean Sale M.D.   On: 11/13/2021 15:59   US Abdomen Limited RUQ (LIVER/GB)  Result Date: 11/13/2021 CLINICAL DATA:  Status post liver biopsy 11/11/2021. Evaluate for hepatic hematoma. EXAM: ULTRASOUND ABDOMEN LIMITED RIGHT UPPER QUADRANT COMPARISON:  Biopsy 11/11/2021.  CT 11/06/2021. FINDINGS: Gallbladder: No gallstones or wall thickening visualized. No sonographic Murphy sign noted by sonographer. Common bile duct: Diameter: Normal, 2 mm. Liver: No evidence of intraparenchymal or perihepatic hematoma. Again identified are multiple liver lesions, including at up to 1.2 cm in the right liver lobe. These demonstrate hypoechogenicity with central echogenicity. Portal vein is patent on color Doppler imaging with normal direction of blood flow towards the liver. Other: None. IMPRESSION: No evidence of procedure related intraparenchymal or perihepatic hematoma. Multiple liver lesions, as before. These demonstrate a morphology which is suspicious for metastatic disease. Electronically Signed   By: Abigail Miyamoto M.D.   On: 11/13/2021 16:24        Scheduled  Meds:  (feeding supplement) PROSource Plus  30 mL Oral BID BM   FLUoxetine  40 mg Oral Daily   fluticasone  1 spray Each Nare Daily   gabapentin  300 mg Oral TID   lamoTRIgine  200 mg Oral Daily   multivitamin with minerals  1 tablet Oral Daily   pantoprazole  40 mg Oral Daily   polyethylene glycol  17 g Oral Daily   pravastatin  40 mg Oral QHS   senna-docusate  2 tablet Oral BID   sodium chloride flush  3 mL Intravenous Q12H   Continuous Infusions:  sodium chloride     heparin 1,000 Units/hr (11/14/21 0758)     LOS: 8 days    Time spent: 35 minutes    Sharen Hones, MD [facility]   To contact the attending provider between  7A-7P or the covering provider during after hours 7P-7A, please log into the web site [email or web address] and access using universal [place] password for that web site. If you do not have the password, please call the hospital operator.  11/14/2021, 10:48 AM

## 2021-11-14 NOTE — Progress Notes (Signed)
OT Cancellation Note  Patient Details Name: Cheryl Schroeder MRN: [id number] DOB: April 13, 1956   Cancelled Treatment:    Reason Eval/Treat Not Completed: Other (comment) Upon presenting to pt room this AM, nursing had just concluded bladder scan and are prepping to cath patient. Will f/u at later date/time as able. Thank you.  Gerrianne Scale, [place], OTR/L ascom [phone] 11/14/21, 9:13 AM

## 2021-11-14 NOTE — Consult Note (Signed)
[place] for Heparin dosing Indication: h/o DVT >> presenting w/ pulmonary embolus and NEW embolic stroke  Allergies  Allergen Reactions   Sulfa Antibiotics Nausea Only and Nausea And Vomiting    Patient Measurements: Height: 5' 5.51" (166.4 cm) Weight: 56 kg (123 lb 7.3 oz) IBW/kg (Calculated) : 58.18  Vital Signs: Temp: 98.3 F (36.8 C) (02/09 1132) Temp Source: Oral (02/09 1132) BP: 116/65 (02/09 1132) Pulse Rate: 97 (02/09 1132)  Labs: Recent Labs    11/12/21 0552 11/13/21 0609 11/14/21 0510  HGB 10.4* 10.1* 9.6*  HCT 33.2* 32.1* 29.7*  PLT 143* 120* 93*  HEPARINUNFRC 0.31  --   --   CREATININE  --  1.26* 1.10*     Estimated Creatinine Clearance: 44.5 mL/min (A) (by C-G formula based on SCr of 1.1 mg/dL (H)).   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  Assessment: patient is a 65 year old female recently diagnosed with a DVT about 3 weeks ago who is on Eliquis PTA now presents with some left upper abdominal pain and back pain for several weeks.  On 2/7, CT positive for pulmonary emboli.   On 2/8 PM, patient with new embolic stroke. MRI brain: "Compared to the recent prior MRI, there are multiple new small foci of cortical and subcortical restricted diffusion in both cerebral hemispheres primarily in the frontal lobes. Some of these have an appearance most suggestive of subacute embolic  infarcts"  On 2/9: Per d/w Heme/Onc and neuro will transition heparin to argatroban during HITT w/u.  Patient's 4T score of 5-6 points Intermediate/High risk (PLT >50% drop & Nadir >20 -- 2; Fall in 5-10d -- 2; progressive DVT>PE -- 1; TCP alternative causes likely/definite -- 1).  Goal of Therapy:  aPTT 50-90s seconds Monitor platelets by anticoagulation protocol: Yes   Plan:   PF4 Ab ordered -- pending. Heparin gtt stopped >> argatroban gtt started. Initiate argatroban gtt @1mcg /kg/min Check first aPTT in 4hrs after infusion start, then after consecutive therapeutic levels, consider q12-24h aPTT's based on pt's clinical & laboratory stability. CTM CBC daily  Lorna Dibble, PharmD, [facility] Clinical Pharmacist 11/14/2021 2:14 PM

## 2021-11-14 NOTE — Evaluation (Signed)
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   Acute pulmonary embolism ([place]) 11/06/2021   Bipolar 2 disorder ([facility])    Body mass index (BMI) 19.9 or less, adult 11/09/2021   CVA (cerebral vascular accident) ([place]) 11/09/2021   DVT of leg (deep venous thrombosis) ([place]) 11/09/2021   Dysplastic nevus    R calf, txted in past, pt states ~2002   Fibromyalgia    Psoriasis    Thyroid nodule 11/09/2021   Past Surgical History:  Past Surgical History:  Procedure Laterality Date   ABDOMINAL HYSTERECTOMY     ADENOIDECTOMY     BREAST BIOPSY Right 1990   cyst   PULMONARY THROMBECTOMY Left 11/08/2021   Procedure: PULMONARY THROMBECTOMY;  Surgeon: Katha Cabal, MD;  Location: [facility];  Service: Cardiovascular;  Laterality: Left;   TONSILLECTOMY     HPI:  Per 54 H&P "Cheryl Schroeder is a 66 y.o. female with medical history significant of recently diagnosed left leg DVT 3 weeks ago on Eliquis, fibromyalgia, bipolar disorder, HLD, chronic peripheral neuropathy, presented with worsening of chest pains, and frequent falls.     Symptoms started about 3 weeks ago, symptoms almost developed the same time, which includes left leg swelling and pain, " tenderness of bilateral rib cages" pleural chest pain worsening with deep breath and cough, and frequent feeling of lightheadedness and to fall several times last week.  No loss of conscious no head injury.  Denies any fever chills, no cough.  She has been taking increasing dose of hydrocodone for the chest pain with little help.     ED Course: No hypoxia, no hypotension no tachycardia.     CTA  positive for acute saddle embolism in the left main pulm artery extending to the left upper lobe and left lower branches, and there are few small filling defects in the subsegmental branches in the right middle lobe and right lower lobe.  Also there is a 4.8 cm homogenous opacity in the anterior right middle lung field, suspicious for primary neoplasm.  Multiple hypodensity lesions on ribs, thoracic and lumbar spine suspicious for metastasis."   MRI Brain 11/13/21 "1. Multiple small acute cerebral and cerebellar infarcts which are new from 11/09/2021. 2. Numerous small foci of enhancement throughout both cerebral hemispheres with very mild mixed interval changes since 11/09/2021. Some of these have an appearance most suggestive of subacute embolic infarcts, while others are more suspicious for metastatic disease (specifically ring-enhancing lesions in the inferior left frontal lobe and right temporal lobe). Short interval imaging follow-up may be helpful for differentiation. 3. Known osseous metastatic disease."  MRI C-Spine 11/13/21 "1. Multifocal osseous metastatic disease within the visualized cervicothoracic spine with probable pathologic fractures at C5, C6 and T3. No cord compression. 2. Relatively mild multilevel spondylosis with scattered mild foraminal narrowing. 3. Right cerebellar vermian infarcts.  See separate MRI head report. 4. Complex enhancing left thyroid nodule. Given comorbidities, this may not be clinically significant, although could be further evaluated with thyroid ultrasound and biopsy as clinically warranted."  CXR 11/14/21 "1. No acute findings. 2. Large RIGHT perihilar mass 3. Lytic lesion in the LEFT humerus."  Assessment / Plan / Recommendation  Clinical Impression  Pt seen for clinical swallowing evaluation. Pt alert, slow to respond at times. Sister at bedside. Sister noted pt with increased confusion and RUE weakness yesterday as well as pt holding  pills in mouth overnight. Pt consuming breakfast upon SLP entrance to room.  Per chart review, temp WFL and WBC elevated. CXR 11/14/21 "1. No acute findings. 2. Large RIGHT perihilar mass 3. Lytic lesion in the LEFT humerus." Pt on room air.   Pt observed with consistencies from breakfast tray. Intermittent assistance provided with feeding by sister due to recent onset of RUE weakness. Pt demonstrated a functional oral swallow. No overt or subtle s/sx pharyngeal dysphagia. Swallow initiation appeared timely. No change to vocal quality across trials.   Recommend continuation of a regular diet with thin liquids and safe swallowing strategies/aspiration precautions as outlined below. Recommend pills whole vs crushed with applesauce for ease of swallowing.    SLP to sign off at this level of care. Recommend follow up SLP at SNF for higher level cognitive-linguisitic deficits as identified on evaluation on 11/10/21.   Pt and sister made aware of diet recommendations, safe swallowing strategies/aspiration precautions, and SLP POC. Both verbalized understanding/agreement. RN also made aware of results, recommendations, and SLP POC.    SLP Visit Diagnosis: Dysphagia, unspecified (R13.10)    Aspiration Risk  Mild aspiration risk    Diet Recommendation Regular;Thin liquid   Medication Administration: Whole meds with puree (vs crushed) Supervision: Patient able to self feed;Staff to assist with self feeding Compensations: Slow rate;Small sips/bites Postural Changes: Seated upright at 90 degrees;Remain upright for at least 30 minutes after po intake    Other  Recommendations Oral Care Recommendations: Oral care BID;Staff/trained caregiver to provide oral care    Recommendations for follow up therapy are one component of a multi-disciplinary discharge planning process, led by the attending physician.  Recommendations may be updated based on patient status, additional functional criteria and insurance  authorization.  Follow up Recommendations Skilled nursing-short term rehab (<3 hours/day) (for higher level cognition)      Assistance Recommended at Discharge Frequent or constant Supervision/Assistance  Functional Status Assessment Patient has had a recent decline in their functional status and demonstrates the ability to make significant improvements in function in a reasonable and predictable amount of time. (for cognition; no f/u swallowing needs)      Prognosis Prognosis for Safe Diet Advancement: Good      Swallow Study   General Date of Onset: 11/13/21 (for pill dysphagia) HPI: Per 18 H&P "PEGEEN STIGER is a 66 y.o. female with medical history significant of recently diagnosed left leg DVT 3 weeks ago on Eliquis, fibromyalgia, bipolar disorder, HLD, chronic peripheral neuropathy, presented with worsening of chest pains, and frequent falls.     Symptoms started about 3 weeks ago, symptoms almost developed the same time, which includes left leg swelling and pain, " tenderness of bilateral rib cages" pleural chest pain worsening with deep breath and cough, and frequent feeling of lightheadedness and to fall several times last week.  No loss of conscious no head injury.  Denies any fever chills, no cough.  She has been taking increasing dose of hydrocodone for the chest pain with little help.     ED Course: No hypoxia, no hypotension no tachycardia.     CTA positive for acute saddle embolism in the left main pulm artery extending to the left upper lobe and left lower branches, and there are  few small filling defects in the subsegmental branches in the right middle lobe and right lower lobe.  Also there is a 4.8 cm homogenous opacity in the anterior right middle lung field, suspicious for primary neoplasm.  Multiple hypodensity lesions on ribs, thoracic and lumbar spine suspicious for metastasis." Type of Study: Bedside Swallow Evaluation Previous Swallow Assessment: unknown Diet  Prior to this Study: Regular;Thin liquids Temperature Spikes Noted: No Respiratory Status: Room air History of Recent Intubation: No Behavior/Cognition: Alert;Cooperative Oral Cavity Assessment: Within Functional Limits Oral Care Completed by SLP: Recent completion by staff Oral Cavity - Dentition:  (adequate) Vision: Functional for self-feeding Self-Feeding Abilities:  (sister feeding pt upon SLP entrance to room due to onset on RUE weakness yesterday) Patient Positioning: Upright in bed Baseline Vocal Quality: Normal Volitional Cough: Strong Volitional Swallow: Able to elicit    Oral/Motor/Sensory Function Overall Oral Motor/Sensory Function: Within functional limits   Thin Liquid Thin Liquid: Within functional limits Presentation: Straw Other Comments: ~6 oz    Solid     Solid: Within functional limits Presentation: Self Fed (intermittent assistance from sister) Other Comments: scrambled eggs, muffin     Cherrie Gauze, M.S., [facility] [phone] (ASCOM)   Clearnce Sorrel Ellakate Gonsalves 11/14/2021,10:31 AM

## 2021-11-15 ENCOUNTER — Inpatient Hospital Stay: Payer: Medicare HMO

## 2021-11-15 DIAGNOSIS — K5903 Drug induced constipation: Secondary | ICD-10-CM

## 2021-11-15 DIAGNOSIS — C3490 Malignant neoplasm of unspecified part of unspecified bronchus or lung: Secondary | ICD-10-CM

## 2021-11-15 LAB — CBC
HCT: 28 % — ABNORMAL LOW (ref 36.0–46.0)
Hemoglobin: 9.2 g/dL — ABNORMAL LOW (ref 12.0–15.0)
MCH: 30.3 pg (ref 26.0–34.0)
MCHC: 32.9 g/dL (ref 30.0–36.0)
MCV: 92.1 fL (ref 80.0–100.0)
Platelets: 127 10*3/uL — ABNORMAL LOW (ref 150–400)
RBC: 3.04 MIL/uL — ABNORMAL LOW (ref 3.87–5.11)
RDW: 13.6 % (ref 11.5–15.5)
WBC: 21 10*3/uL — ABNORMAL HIGH (ref 4.0–10.5)
nRBC: 0 % (ref 0.0–0.2)

## 2021-11-15 LAB — BASIC METABOLIC PANEL
Anion gap: 9 (ref 5–15)
BUN: 39 mg/dL — ABNORMAL HIGH (ref 8–23)
CO2: 26 mmol/L (ref 22–32)
Calcium: 8.5 mg/dL — ABNORMAL LOW (ref 8.9–10.3)
Chloride: 95 mmol/L — ABNORMAL LOW (ref 98–111)
Creatinine, Ser: 1.26 mg/dL — ABNORMAL HIGH (ref 0.44–1.00)
GFR, Estimated: 47 mL/min — ABNORMAL LOW (ref >60)
Glucose, Bld: 133 mg/dL — ABNORMAL HIGH (ref 70–99)
Potassium: 4 mmol/L (ref 3.5–5.1)
Sodium: 130 mmol/L — ABNORMAL LOW (ref 135–145)

## 2021-11-15 LAB — HEPARIN INDUCED PLATELET AB (HIT ANTIBODY): Heparin Induced Plt Ab: 0.114 OD (ref 0.000–0.400)

## 2021-11-15 LAB — MAGNESIUM: Magnesium: 2.5 mg/dL — ABNORMAL HIGH (ref 1.7–2.4)

## 2021-11-15 LAB — APTT
aPTT: 94 seconds — ABNORMAL HIGH (ref 24–36)
aPTT: 96 seconds — ABNORMAL HIGH (ref 24–36)

## 2021-11-15 MED ORDER — HEPARIN (PORCINE) 25000 UT/250ML-% IV SOLN
900.0000 [IU]/h | INTRAVENOUS | Status: DC
  Administered 2021-11-15: 18:00:00 900 [IU]/h via INTRAVENOUS
  Filled 2021-11-15: qty 250

## 2021-11-15 MED ORDER — SODIUM CHLORIDE 1 G PO TABS
1.0000 g | ORAL_TABLET | Freq: Two times a day (BID) | ORAL | Status: DC
  Administered 2021-11-15 – 2021-11-17 (×4): 1 g via ORAL
  Filled 2021-11-15 (×4): qty 1

## 2021-11-15 MED ORDER — CHLORHEXIDINE GLUCONATE CLOTH 2 % EX PADS
6.0000 | MEDICATED_PAD | Freq: Every day | CUTANEOUS | Status: DC
  Administered 2021-11-16 – 2021-11-17 (×3): 6 via TOPICAL

## 2021-11-15 MED ORDER — DOCUSATE SODIUM 100 MG PO CAPS
100.0000 mg | ORAL_CAPSULE | Freq: Two times a day (BID) | ORAL | Status: DC
  Administered 2021-11-15 – 2021-11-17 (×4): 100 mg via ORAL
  Filled 2021-11-15 (×4): qty 1

## 2021-11-15 MED ORDER — LACTULOSE 10 GM/15ML PO SOLN
20.0000 g | Freq: Once | ORAL | Status: AC
  Administered 2021-11-15: 16:00:00 20 g via ORAL
  Filled 2021-11-15: qty 30

## 2021-11-15 MED ORDER — OXYCODONE HCL 5 MG PO TABS
10.0000 mg | ORAL_TABLET | ORAL | Status: DC | PRN
  Administered 2021-11-15 – 2021-11-16 (×2): 10 mg via ORAL
  Filled 2021-11-15 (×3): qty 2

## 2021-11-15 NOTE — Progress Notes (Signed)
Hematology/Oncology Progress note [phone] [phone]     Patient Care Team: Lynnell Jude, MD as PCP - General (Family Medicine) Telford Nab, RN as Oncology Nurse Navigator   Name of the patient: Cheryl Schroeder  [id number]  1956/09/21  Date of visit: 11/15/21   INTERVAL HISTORY-  11/09/2021, MRI brain with and without contrast showed 2 brain lesions suspicious for metastatic disease.  Numerous foci of lesions suspecting acute and subacute infarcts.  Osseous metastasis of C1 and C2.  And right occipital calvarium  11/11/2021, status post ultrasound guided liver biopsy.  Patient had a liver hematoma.  Anticoagulation is temporarily held until tomorrow. Patient was seen by neurology for stroke and completed a stroke work-up including echocardiogram, CT angiogram of the neck  11/13/2021 patient reports new onset of right upper extremity weakness since yesterday.  One of her sisters were at the bedside.  Neurology and I were notified.  Recommend MRI brain, MRI spine for further evaluation.  Patient was known to have 2 small brain metastatic lesions [ 47mm, 39mm], numerous additional punctate linear area of enhancement, may represent a metastasis or subacute infarcts.  She also had multiple acute and subacute infarct on MRI done on 11/09/2021.  No report of vasogenic edema, acute hemorrhage or midline shift on that MRI.  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  NGS pending.. 11/15/2021 platelet improves today. On Argatroban, HIT panel came back negative. .  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/09/2021   DVT of leg (deep venous thrombosis) ([place]) 11/09/2021   Thyroid nodule 11/09/2021   Renal lesion 11/09/2021   Brain mass 11/09/2021   CVA (cerebral vascular accident) ([place]) 11/09/2021   Body mass index (BMI) 19.9 or less, adult 11/09/2021   Malignant pleural effusion    Mass of right lung    Bone lesion    Liver lesion    Acute pulmonary embolism ([place]) 11/06/2021   Pulmonary emboli ([place]) 11/06/2021   Plantar fasciitis 07/09/2014     Past Medical History:  Diagnosis Date   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   Acute pulmonary embolism ([place]) 11/06/2021   Bipolar 2 disorder (HCC)    Body mass index (BMI) 19.9 or less, adult 11/09/2021   CVA (cerebral vascular accident) ([place]) 11/09/2021   DVT of leg (deep venous thrombosis) ([place]) 11/09/2021   Dysplastic nevus    R calf, txted in past, pt states ~2002   Fibromyalgia    Psoriasis    Thyroid nodule 11/09/2021     Past Surgical History:  Procedure Laterality Date   ABDOMINAL HYSTERECTOMY     ADENOIDECTOMY     BREAST BIOPSY Right 1990   cyst   PULMONARY THROMBECTOMY Left 11/08/2021   Procedure: PULMONARY THROMBECTOMY;  Surgeon: Katha Cabal, MD;  Location: [facility];  Service: Cardiovascular;  Laterality: Left;   TONSILLECTOMY      Social History   Socioeconomic History   Marital status: Single    Spouse  name: Not on file   Number of children: Not on file   Years of education: Not on file   Highest education level: Not on file  Occupational History   Not on file  Tobacco Use   Smoking status: Never   Smokeless tobacco: Never  Vaping Use   Vaping Use: Never used  Substance and Sexual Activity   Alcohol use: No   Drug use: No   Sexual activity: Not on file  Other Topics Concern   Not on file  Social History Narrative    Not on file   Social Determinants of Health   Financial Resource Strain: Not on file  Food Insecurity: Not on file  Transportation Needs: Not on file  Physical Activity: Not on file  Stress: Not on file  Social Connections: Not on file  Intimate Partner Violence: Not on file     Family History  Problem Relation Age of Onset   Breast cancer Maternal Aunt 72   Lung cancer Father      Current Facility-Administered Medications:    (feeding supplement) PROSource Plus liquid 30 mL, 30 mL, Oral, BID BM, Roosevelt Locks, Dekui, MD, 30 mL at 11/15/21 1609   0.9 %  sodium chloride infusion, 250 mL, Intravenous, PRN, Schnier, Dolores Lory, MD   acetaminophen (TYLENOL) tablet 650 mg, 650 mg, Oral, Q4H PRN, Schnier, Dolores Lory, MD, 650 mg at 11/06/21 2019   ALPRAZolam (XANAX) tablet 0.5-1 mg, 0.5-1 mg, Oral, QHS PRN, Schnier, Dolores Lory, MD, 1 mg at 11/13/21 1357   argatroban 1 mg/mL infusion, 0.3 mcg/kg/min, Intravenous, Continuous, Ellington, Abby K, RPH, Last Rate: 1.01 mL/hr at 11/15/21 1145, 0.3 mcg/kg/min at 11/15/21 1145   FLUoxetine (PROZAC) capsule 40 mg, 40 mg, Oral, Daily, Schnier, Dolores Lory, MD, 40 mg at 11/15/21 0958   fluticasone (FLONASE) 50 MCG/ACT nasal spray 1 spray, 1 spray, Each Nare, Daily, Schnier, Dolores Lory, MD   gabapentin (NEURONTIN) capsule 300 mg, 300 mg, Oral, TID, Schnier, Dolores Lory, MD, 300 mg at 11/15/21 1607   HYDROcodone-acetaminophen (NORCO/VICODIN) 5-325 MG per tablet 1 tablet, 1 tablet, Oral, Q4H PRN, Schnier, Dolores Lory, MD, 1 tablet at 11/11/21 1929   HYDROmorphone (DILAUDID) injection 0.5 mg, 0.5 mg, Intravenous, Q4H PRN, Schnier, Dolores Lory, MD, 0.5 mg at 11/15/21 1615   lamoTRIgine (LAMICTAL) tablet 200 mg, 200 mg, Oral, Daily, Schnier, Dolores Lory, MD, 200 mg at 11/15/21 0957   midazolam (VERSED) 5 MG/5ML injection, , Intravenous, PRN, El-Abd, Joesph Fillers, MD, 1 mg at 11/11/21 1515   multivitamin with minerals tablet 1 tablet, 1 tablet, Oral, Daily, Schnier, Dolores Lory, MD, 1  tablet at 11/15/21 0958   ondansetron (ZOFRAN) injection 4 mg, 4 mg, Intravenous, Q6H PRN, Schnier, Dolores Lory, MD, 4 mg at 11/15/21 2119   oxyCODONE (Oxy IR/ROXICODONE) immediate release tablet 10 mg, 10 mg, Oral, Q4H PRN, Sharen Hones, MD, 10 mg at 11/15/21 1410   pantoprazole (PROTONIX) EC tablet 40 mg, 40 mg, Oral, Daily, Schnier, Dolores Lory, MD, 40 mg at 11/15/21 0958   polyethylene glycol (MIRALAX / GLYCOLAX) packet 17 g, 17 g, Oral, Daily, Schnier, Dolores Lory, MD, 17 g at 11/15/21 0959   pravastatin (PRAVACHOL) tablet 40 mg, 40 mg, Oral, QHS, Schnier, Dolores Lory, MD, 40 mg at 11/14/21 2240   senna-docusate (Senokot-S) tablet 2 tablet, 2 tablet, Oral, BID, Schnier, Dolores Lory, MD, 2 tablet at 11/15/21 0957   sodium chloride flush (NS) 0.9 % injection 3 mL, 3 mL, Intravenous, Q12H, Schnier, Dolores Lory, MD,  3 mL at 11/15/21 1008   sodium chloride flush (NS) 0.9 % injection 3 mL, 3 mL, Intravenous, PRN, Schnier, Dolores Lory, MD   sodium chloride tablet 1 g, 1 g, Oral, BID WC, Sharen Hones, MD   tamsulosin [facility]) capsule 0.4 mg, 0.4 mg, Oral, QPC supper, Sharen Hones, MD, 0.4 mg at 11/14/21 1642   Physical exam:  Vitals:   11/15/21 0014 11/15/21 0446 11/15/21 0725 11/15/21 1314  BP: 121/63 108/72 109/68 (!) 100/55  Pulse: 96 98 100 99  Resp: 16 16 19 16   Temp: 99 F (37.2 C) 99.2 F (37.3 C) 98.5 F (36.9 C) 98.4 F (36.9 C)  TempSrc:  Oral    SpO2: 95% 95% 94% 93%  Weight:      Height:       Physical Exam Constitutional:      General: She is not in acute distress.    Comments: Frail appearance  HENT:     Head: Normocephalic and atraumatic.  Eyes:     General: No scleral icterus. Cardiovascular:     Rate and Rhythm: Normal rate.     Heart sounds: No murmur heard. Pulmonary:     Effort: Pulmonary effort is normal. No respiratory distress.  Abdominal:     General: There is no distension.  Musculoskeletal:        General: Normal range of motion.     Cervical back: Normal range  of motion and neck supple.  Skin:    General: Skin is warm and dry.     Findings: No erythema.  Neurological:     Mental Status: She is alert and oriented to person, place, and time.     Cranial Nerves: No cranial nerve deficit.     Motor: No abnormal muscle tone.     Coordination: Coordination normal.     Comments: RUE strength, 3/5.    Psychiatric:        Mood and Affect: Affect normal.     CMP Latest Ref Rng & Units 11/15/2021  Glucose 70 - 99 mg/dL 133(H)  BUN 8 - 23 mg/dL 39(H)  Creatinine 0.44 - 1.00 mg/dL 1.26(H)  Sodium 135 - 145 mmol/L 130(L)  Potassium 3.5 - 5.1 mmol/L 4.0  Chloride 98 - 111 mmol/L 95(L)  CO2 22 - 32 mmol/L 26  Calcium 8.9 - 10.3 mg/dL 8.5(L)  Total Protein 6.5 - 8.1 g/dL -  Total Bilirubin 0.3 - 1.2 mg/dL -  Alkaline Phos 38 - 126 U/L -  AST 15 - 41 U/L -  ALT 0 - 44 U/L -   CBC Latest Ref Rng & Units 11/15/2021  WBC 4.0 - 10.5 K/uL 21.0(H)  Hemoglobin 12.0 - 15.0 g/dL 9.2(L)  Hematocrit 36.0 - 46.0 % 28.0(L)  Platelets 150 - 400 K/uL 127(L)    RADIOGRAPHIC STUDIES: I have personally reviewed the radiological images as listed and agreed with the findings in the report. CT ANGIO HEAD NECK W WO CM  Result Date: 11/09/2021 CLINICAL DATA:  Stroke suspected EXAM: CT ANGIOGRAPHY HEAD AND NECK TECHNIQUE: Multidetector CT imaging of the head and neck was performed using the standard protocol during bolus administration of intravenous contrast. Multiplanar CT image reconstructions and MIPs were obtained to evaluate the vascular anatomy. Carotid stenosis measurements (when applicable) are obtained utilizing NASCET criteria, using the distal internal carotid diameter as the denominator. RADIATION DOSE REDUCTION: This exam was performed according to the departmental dose-optimization program which includes automated exposure control, adjustment of the mA and/or kV according  to patient size and/or use of iterative reconstruction technique. CONTRAST:  49mL  OMNIPAQUE IOHEXOL 350 MG/ML SOLN COMPARISON:  No prior CT or CTA clinical correlation is made with MRI 11/09/2021; FINDINGS: CT HEAD FINDINGS Brain: No evidence of acute cortical infarction, hemorrhage, cerebral edema, mass, mass effect, or midline shift. No hydrocephalus or extra-axial fluid collection. Vascular: No hyperdense vessel. Skull: Normal. Negative for fracture or focal lesion. Sinuses/Orbits: No acute finding. Other: Fluid in the right mastoid air cells. CTA NECK FINDINGS Aortic arch: Standard branching. Imaged portion shows no evidence of aneurysm or dissection. No significant stenosis of the major arch vessel origins. Right carotid system: No evidence of dissection, stenosis (50% or greater) or occlusion. Left carotid system: No evidence of dissection, stenosis (50% or greater) or occlusion. Vertebral arteries: The left vertebral artery is patent from its origin to the skull base. The right vertebral artery is diminutive, likely congenitally given the size of the vertebral artery foramen, and is patent to the skull base. No evidence of dissection, occlusion, or hemodynamically significant stenosis (greater than 50%). Skeleton: Numerous lytic lesions in the cervical and imaged thoracic spine. Possible mild vertebral body height loss at C6. Other neck: Hypoenhancing lesion in the left thyroid lobe measures up to 2.2 cm. Upper chest: Redemonstrated saddle embolus in the left main pulmonary artery, extending into the branch vessels, which was better evaluated on the 11/06/2021 CTA. Slight decrease in the size of a right pleural effusion, with new trace left pleural effusion. New ground-glass and patchy opacities in the right upper lobe (series 9, images 234, 237, 275, and 305) and superior right lower lobe (series 9, image 310), with redemonstrated opacities in the left upper lobe (series 9, image 232) and medial right upper lobe (series 9, image 295). Partially imaged inferior right upper lobe mass.  Review of the MIP images confirms the above findings CTA HEAD FINDINGS Anterior circulation: Both internal carotid arteries are patent to the termini, without significant stenosis. A1 segments patent. Normal anterior communicating artery. Anterior cerebral arteries are patent to their distal aspects. No M1 stenosis or occlusion. Normal MCA bifurcations. Distal MCA branches perfused and symmetric. Posterior circulation: The right vertebral artery is quite diminutive in the proximal V4 segment and further decreased in caliber after the takeoff of the PICA (series 9, image 129); while this may represent a degree of stenosis, it could also be due to normal branching of an already diminutive vessel. The dominant left vertebral artery is patent to the vertebrobasilar junction. Posterior inferior cerebral arteries patent bilaterally. Basilar patent to its distal aspect. Superior cerebellar arteries patent bilaterally. Patent right posterior communicating artery, with a hypoplastic right P1 and near fetal origin of the right PCA. The left P1 is patent, with a small patent left posterior communicating artery. PCAs perfused to their distal aspects without stenosis. Venous sinuses: As permitted by contrast timing, patent. Anatomic variants: Near fetal origin of the right PCA. Review of the MIP images confirms the above findings IMPRESSION: 1. Diminutive extracranial right vertebral artery, likely congenital, with further diminution of the intracranial right V4 after the takeoff of the right PICA, which may reflect a degree of stenosis and/or normal post-branching caliber of an already diminutive vessel. The left vertebral artery is dominant and patent. No intracranial large vessel occlusion or other hemodynamically significant stenosis. 2.  No hemodynamically significant stenosis in the neck. 3. Partially imaged right mid lung mass, with increased ground-glass opacities in the imaged right upper and lower lobes, concerning for  worsening multifocal infection. 4. Slightly decreased right pleural effusion, with new trace left pleural effusion. 5. Lytic lesions in the cervical spine, with possible mild vertebral body height loss in C6. 6. Hypoenhancing lesion in the left thyroid lobe, which measures up to 2.2 cm. If this has not previously been evaluated, a non-emergent ultrasound of the thyroid is recommended. (Reference: J Am Coll Radiol. 2015 Feb;12(2): 143-50) 7. Electronically Signed   By: Merilyn Baba M.D.   On: 11/09/2021 21:09   DG Chest 2 View  Result Date: 11/14/2021 CLINICAL DATA:  Pneumonia EXAM: CHEST - 2 VIEW COMPARISON:  Chest radiograph 11/07/2021 FINDINGS: Normal cardiac silhouette. RIGHT perihilar mass measuring 5 cm again noted. Mild atelectasis along the LEFT midlung fissure. Lytic lesion in the LEFT humerus. IMPRESSION: 1. No acute findings. 2. Large RIGHT perihilar mass 3. Lytic lesion in the LEFT humerus. Electronically Signed   By: Suzy Bouchard M.D.   On: 11/14/2021 08:32   DG Chest 2 View  Result Date: 10/22/2021 CLINICAL DATA:  Contusion of the rib on the left side. Left rib pain. EXAM: CHEST - 2 VIEW COMPARISON:  None. FINDINGS: The heart size and mediastinal contours are within normal limits. Soft tissue density mass with area of atelectasis in the right mid lung measuring approximately 3.0 x 4.9 cm. Bibasilar atelectasis. Hyperinflated lungs. No pleural effusion or pneumothorax. The visualized skeletal structures are unremarkable. IMPRESSION: 1. Soft tissue density mass in the right mid lung measuring at least 3.0 x 4.9 cm, it may represent pneumonia, loculated pleural effusion or pulmonary mass. Further evaluation with CT examination would be helpful. Follow-up to resolution is recommended. 2. Hyperinflated lungs concerning for COPD. Bibasilar atelectasis. No pleural effusion or pneumothorax. An attempt was made to reach out to the provider Dr. Clemmie Krill without response from our office at the time of  dictation. Electronically Signed   By: Keane Police D.O.   On: 10/22/2021 16:07   DG Ribs Unilateral W/Chest Left  Result Date: 11/06/2021 CLINICAL DATA:  Fall, left rib pain EXAM: LEFT RIBS AND CHEST - 3+ VIEW COMPARISON:  Chest x-ray 10/22/2021 FINDINGS: No acute fracture identified in the left ribs. Cardiomediastinal silhouette is unchanged. Persistent ovoid masslike opacity adjacent to the fissure in the right lung measuring approximately 5 x 3.2 cm, grossly unchanged. Linear opacities in the left lower lung zone likely represent subsegmental atelectasis. Hyperinflated lungs with mildly prominent interstitial lung markings. No pleural effusion or pneumothorax visualized. IMPRESSION: 1. No left rib fractures identified. 2. Persistent and unchanged ovoid perifissural masslike opacity in the right lung. 3. COPD. Electronically Signed   By: Ofilia Neas M.D.   On: 11/06/2021 12:16   CT Angio Chest PE W and/or Wo Contrast  Result Date: 11/06/2021 CLINICAL DATA:  Chest pain, recent trauma EXAM: CT ANGIOGRAPHY CHEST WITH CONTRAST TECHNIQUE: Multidetector CT imaging of the chest was performed using the standard protocol during bolus administration of intravenous contrast. Multiplanar CT image reconstructions and MIPs were obtained to evaluate the vascular anatomy. RADIATION DOSE REDUCTION: This exam was performed according to the departmental dose-optimization program which includes automated exposure control, adjustment of the mA and/or kV according to patient size and/or use of iterative reconstruction technique. CONTRAST:  152mL OMNIPAQUE IOHEXOL 350 MG/ML SOLN COMPARISON:  Chest radiographs done earlier today FINDINGS: Cardiovascular: There is homogeneous enhancement in the thoracic aorta. There is mild ectasia of main pulmonary artery measuring 3.2 cm. There is saddle embolus in the left main pulmonary artery extending into the left upper  lobe and left lower lobe branches. There are few small filling  defects in the subsegmental branches in the right middle lobe and right lower lobe. There are no imaging signs of right ventricular strain. Mediastinum/Nodes: No significant lymphadenopathy seen. There is 2.5 cm low-density lesion in the enlarged left lobe of thyroid. Lungs/Pleura: There is moderate right pleural effusion. There are small patchy infiltrates in the medial aspect of right upper lobe in the right upper lung fields. There is 5.2 x 4.1 cm homogeneous density inseparable from interlobar fissure in the anterior right parahilar region. There is narrowing of the lumen of the bronchus leading to this homogeneous opacity in the anteromedial right mid lung fields. Linear patchy infiltrates are seen in both lower lung fields, more so on the right side. In image 13 of series 6, there is 2 cm ground-glass density in the anterior left apex. There is moderate right pleural effusion. There is no significant left pleural effusion. There is no pneumothorax. Upper Abdomen: Fatty liver. Musculoskeletal: There are numerous lytic lesions of varying sizes in the left ribs, thoracic spine and lumbar spine. There is decrease in height of bodies of C6 vertebra. There is also decrease in height of bodies of T6, T7, T8 and T9 vertebrae. Review of the MIP images confirms the above findings. IMPRESSION: There is evidence of acute pulmonary embolism with small to moderate thrombus burden. There are no imaging signs of acute right ventricular strain. There is no evidence of thoracic aortic dissection. There is 4.8 cm homogeneous opacity in the anterior right mid lung fields. This lesion has to be considered primary malignant neoplasm until proven otherwise. There is narrowing of bronchus leading to this parahilar density. PET-CT and biopsy as warranted should be considered. There are numerous lytic lesions of varying sizes along with compression fractures in the cervical and thoracic spine as described in the body of the report  consistent with extensive skeletal metastatic disease. Moderate right pleural effusion. There are ground-glass and patchy alveolar infiltrates in both lungs as described in the body of the report suggesting multifocal pneumonia. There is 2.5 cm low-density nodule in the left lobe of thyroid. When the patient's clinical condition permits, thyroid sonogram may be considered. Electronically Signed   By: Elmer Picker M.D.   On: 11/06/2021 15:21   MR BRAIN W WO CONTRAST  Result Date: 11/13/2021 CLINICAL DATA:  Brain metastases suspected. EXAM: MRI HEAD WITHOUT AND WITH CONTRAST TECHNIQUE: Multiplanar, multiecho pulse sequences of the brain and surrounding structures were obtained without and with intravenous contrast. CONTRAST:  14mL GADAVIST GADOBUTROL 1 MMOL/ML IV SOLN COMPARISON:  Head MRI 11/09/2021 FINDINGS: Brain: Compared to the recent prior MRI, there are multiple new small foci of cortical and subcortical restricted diffusion in both cerebral hemispheres primarily in the frontal lobes (with examples annotated with arrows on series 5), and there are also new small foci of restricted diffusion in both cerebellar hemispheres. These are nonenhancing and consistent with interval acute infarcts. Numerous other small foci of milder diffusion weighted signal abnormality are again seen throughout both cerebral hemispheres and cerebellum as described on the recent prior MRI, and multiple small foci of enhancement are again seen throughout both cerebral hemispheres, some of which correspond to some of the areas of mild diffusion weighted signal abnormality while many do not. These enhancing foci demonstrate mild mixed interval changes compared to the recent prior MRI, with some appearing slightly larger, others appearing slightly regressed, and others not as well seen due to motion  artifact on the current examination. A 10 mm ring-enhancing lesion with hemosiderin deposition in the inferior left frontal lobe is  unchanged (series 18, image 86), as is a 6 mm ring-enhancing lesion in the inferior right temporal lobe (series 18, image 52). No midline shift or extra-axial fluid collection is evident. The ventricles and sulci are normal. There is a background of mild chronic small vessel ischemia in the cerebral white matter. Vascular: Hypoplastic, poorly visualized distal right vertebral artery more fully evaluated on recent CTA. Other major intracranial vascular flow voids are preserved. Skull and upper cervical spine: Multiple bone metastases in the cervical spine, more fully evaluated on today's separate dedicated spine MRI. Unchanged subcentimeter enhancing focus in the right occipital skull. Sinuses/Orbits: Unremarkable orbits.  Small right mastoid effusion. Other: None. IMPRESSION: 1. Multiple small acute cerebral and cerebellar infarcts which are new from 11/09/2021. 2. Numerous small foci of enhancement throughout both cerebral hemispheres with very mild mixed interval changes since 11/09/2021. Some of these have an appearance most suggestive of subacute embolic infarcts, while others are more suspicious for metastatic disease (specifically ring-enhancing lesions in the inferior left frontal lobe and right temporal lobe). Short interval imaging follow-up may be helpful for differentiation. 3. Known osseous metastatic disease. Electronically Signed   By: Logan Bores M.D.   On: 11/13/2021 16:18   MR BRAIN W WO CONTRAST  Result Date: 11/09/2021 CLINICAL DATA:  Weight loss, brain metastases suspected EXAM: MRI HEAD WITHOUT AND WITH CONTRAST TECHNIQUE: Multiplanar, multiecho pulse sequences of the brain and surrounding structures were obtained without and with intravenous contrast. CONTRAST:  81mL GADAVIST GADOBUTROL 1 MMOL/ML IV SOLN COMPARISON:  None. FINDINGS: Brain: Round, peripherally enhancing lesion in the left inferior frontal lobe, which measures up to 9 x 9 mm (series 18, image 17), associated hemosiderin  deposition, concerning for a hemorrhagic metastasis. This area is associated with hemosiderin deposition. Peripherally enhancing lesion in the left frontal lobe measures up to 4 mm (series 18, image 127). Multiple additional enhancing foci are also seen, some of which correlate with areas of restricted diffusion, described below, while some appear more masslike. These enhancing foci are seen on series 18 on the following images: 136, 120, 117, 115 113, 111, 108, 106, 104, 95, 94, 93, 92, 90, 87, 84, 80, 78, 77, 76, 70, and 59. Scattered foci of increased signal on diffusion-weighted imaging throughout the bilateral cerebral and cerebellar hemispheres, some of which demonstrate ADC correlates; for example several cortical foci and white matter areas in the posterior right frontal lobe (series 5, image 32-34), in the left anterior frontal lobe (series 5, image 27-28), areas along the right insula (series 5, image 27), left caudate (series 5, image 28), and bilateral cerebellar hemispheres (series 5, image 12-14). Some foci of increased signal on diffusion-weighted imaging do not demonstrate ADC correlates, and may reflect subacute infarcts with normalization of the ADC. However, some of these correlate with foci of enhancement such as on series 18, image 104. No acute hemorrhage or midline shift. No hydrocephalus or extra-axial collection Vascular: Normal flow voids. Skull and upper cervical spine: Abnormal enhancement in the arch (series 18, image 15) and bilateral lateral masses of C1 (series 18, image 8 and 10), as well as the anterior aspect of C2 (series 18, image 1). Small enhancing focus in the right occipital calvarium (series 18, image 72 Sinuses/Orbits: Negative. Other: Fluid throughout the right mastoid air cells. IMPRESSION: 1. Two round, peripherally enhancing lesions in the left frontal lobe, concerning for  metastatic disease. Numerous additional punctate and linear areas of enhancement are also seen,  some of which may represent additional metastatic lesions, but some of which may represent enhancing, subacute infarcts. 2. Numerous foci of restricted diffusion in the bilateral cerebral and cerebellar hemispheres, some of which demonstrate ADC correlates and some of which may have normalized ADC values. These are concerning for acute and subacute infarcts, likely embolic in etiology given multiple vascular territories. 3. Abnormal enhancement in C1, C2, and the right occipital calvarium, concerning for osseous metastatic disease. These results were called by telephone at the time of interpretation on 11/09/2021 at 3:58 am to provider [facility] , who verbally acknowledged these results. Electronically Signed   By: Merilyn Baba M.D.   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 Recent pulmonary embolism. EXAM: MRI CERVICAL SPINE WITHOUT AND WITH CONTRAST TECHNIQUE: Multiplanar and multiecho pulse sequences of the cervical spine, to include the craniocervical junction and cervicothoracic junction, were obtained without and with intravenous contrast. CONTRAST:  78mL GADAVIST GADOBUTROL 1 MMOL/ML IV SOLN COMPARISON:  Chest CT 05/26/2022. Cervical MRI 11/10/2008. Cranial MRI 11/09/2021 and today. FINDINGS: Alignment: Physiologic. Vertebrae: Multifocal osseous metastatic disease throughout the cervical and upper thoracic spine, involving all segments except C3. Probable associated pathologic fractures at C5, C6 and T3 without significant osseous retropulsion or epidural tumor. Probable necrotic metastasis with peripheral enhancement at the right T2 costovertebral junction, measuring up to 1.9 cm on image 2/9. Cord: Normal in signal and caliber. No abnormal intradural enhancement. Posterior Fossa, vertebral arteries, paraspinal tissues: Subacute infarct in the right cerebellar vermis with underlying Chiari  1 malformation. See separate MRI brain report.Bilateral vertebral artery flow voids. There is a complex enhancing mass involving the left thyroid lobe, incompletely visualized this measures up to 2.8 cm on sagittal image 12/15. Disc levels: Relatively mild multilevel spondylosis with disc bulging and uncinate spurring greatest at C5-6 and C6-7. There are scattered facet degenerative changes. No evidence of cord compression. Mild multilevel foraminal narrowing, greatest on the left at C4-5 and on the right at C6-7. IMPRESSION: 1. Multifocal osseous metastatic disease within the visualized cervicothoracic spine with probable pathologic fractures at C5, C6 and T3. No cord compression. 2. Relatively mild multilevel spondylosis with scattered mild foraminal narrowing. 3. Right cerebellar vermian infarcts.  See separate MRI head report. 4. Complex enhancing left thyroid nodule. Given comorbidities, this may not be clinically significant, although could be further evaluated with thyroid ultrasound and biopsy as clinically warranted. Electronically Signed   By: Richardean Sale M.D.   On: 11/13/2021 15:59   CT ABDOMEN PELVIS W CONTRAST  Result Date: 11/06/2021 CLINICAL DATA:  Abdominal pain, acute, nonlocalized. Status post fall EXAM: CT ABDOMEN AND PELVIS WITH CONTRAST TECHNIQUE: Multidetector CT imaging of the abdomen and pelvis was performed using the standard protocol following bolus administration of intravenous contrast. RADIATION DOSE REDUCTION: This exam was performed according to the departmental dose-optimization program which includes automated exposure control, adjustment of the mA and/or kV according to patient size and/or use of iterative reconstruction technique. CONTRAST:  172mL OMNIPAQUE IOHEXOL 350 MG/ML SOLN COMPARISON:  None. FINDINGS: Lower chest: Right pleural effusion. Please see separately dictated CT angiography chest 11/06/2021. Ports and Devices: None. Liver: Not enlarged. Several vague  hypodense right hepatic lobe lesions measuring 0.9, 1.4, 0.7, 0.5 cm. No laceration or subcapsular hematoma. Biliary System: The gallbladder is otherwise unremarkable with no  radio-opaque gallstones. No biliary ductal dilatation. Pancreas: Normal pancreatic contour. No main pancreatic duct dilatation. Spleen: Not enlarged. No focal lesion. No laceration, subcapsular hematoma, or vascular injury. Adrenal Glands: No nodularity bilaterally. Kidneys: Bilateral kidneys enhance symmetrically. No hydronephrosis. Subcentimeter hypodensities are too small characterize. A 1.1 cm vague wedge-shaped hypodensity along the left superior renal pole that likely represents a poorly defined lesion with a density of 45 Hounsfield units. No contusion, laceration, or subcapsular hematoma. No injury to the vascular structures or collecting systems. No hydroureter. The urinary bladder is unremarkable. Bowel: No small or large bowel wall thickening or dilatation. Non-visualization of the appendix. The appendix is not definitely identified with no inflammatory changes in the right lower quadrant to suggest acute appendicitis. Mesentery, Omentum, and Peritoneum: No simple free fluid ascites. No pneumoperitoneum. No hemoperitoneum. No mesenteric hematoma identified. No organized fluid collection. Pelvic Organs: Normal. Lymph Nodes: No abdominal, pelvic, inguinal lymphadenopathy. Vasculature: No abdominal aorta or iliac aneurysm. No active contrast extravasation or pseudoaneurysm. Musculoskeletal: No significant soft tissue hematoma. No acute pelvic fracture. No spinal fracture. Diffuse appendicular and axial skeleton lytic lesions. IMPRESSION: 1.  No acute traumatic injury to the abdomen, or pelvis. 2. No acute fracture or traumatic malalignment of the lumbar spine. 3. Several vague indeterminate hypodense right hepatic lobe lesions measuring 0.9, 1.4, 0.7, 0.5 cm. Findings could represent metastases. 4. Diffuse appendicular and axial  skeleton lytic metastases. 5. An indeterminate 1.1 cm vague slightly wedge-shaped hypodensity along the left superior renal pole. Finding could represent an underlying poorly defined lesion, small infarction, less likely focal pyelonephritis. 6. Please see separately dictated CT angiography chest 11/06/2021. Electronically Signed   By: Iven Finn M.D.   On: 11/06/2021 15:15   PERIPHERAL VASCULAR CATHETERIZATION  Result Date: 11/08/2021 See surgical note for result.  US BIOPSY (LIVER)  Result Date: 11/11/2021 INDICATION: Liver lesion EXAM: Ultrasound-guided core needle biopsy of liver lesion in the right hepatic lobe MEDICATIONS: None. ANESTHESIA/SEDATION: Moderate (conscious) sedation was employed during this procedure. A total of Versed 1 mg and Fentanyl 50 mcg was administered intravenously by the radiology nurse. Total intra-service moderate Sedation Time: 10 minutes. The patient's level of consciousness and vital signs were monitored continuously by radiology nursing throughout the procedure under my direct supervision. FLUOROSCOPY: N/a COMPLICATIONS: None immediate. PROCEDURE: Informed written consent was obtained from the patient after a thorough discussion of the procedural risks, benefits and alternatives. All questions were addressed. Maximal Sterile Barrier Technique was utilized including caps, mask, sterile gowns, sterile gloves, sterile drape, hand hygiene and skin antiseptic. A timeout was performed prior to the initiation of the procedure. The patient was placed supine on the exam table. Limited ultrasound exam of the liver was performed, which again demonstrated an approximately 1.1 cm hypoechoic nodule in the right hepatic lobe, compatible with recent CT demonstrating multiple small liver lesions. Skin entry site was marked, and the overlying skin was prepped and draped in a standard sterile fashion. Local analgesia was obtained with 1% lidocaine. Under ultrasound guidance, a 17 gauge  introducer needle was advanced towards the identified lesion in the right hepatic lobe. Subsequently, core needle biopsy was performed using an 18 gauge core biopsy device x4 passes. Specimens were submitted in formalin to pathology for further handling. Limited postprocedure imaging demonstrated a small hypoechoic area deep to the lesion, suggestive of a small intraparenchymal hematoma. Review of biopsy images demonstrated likely injury to a small portal vein branch. This area was found to be stable over a short observation  period. No subcapsular hematoma or bleeding into the hepatorenal space was identified. A clean dressing was placed after manual hemostasis. The patient tolerated the procedure well. IMPRESSION: 1. Successful ultrasound-guided core needle biopsy of a 1.1 cm lesion in the right hepatic lobe. 2. Postprocedure images demonstrate a small intraparenchymal hematoma which was stable over a short observation period. Interventional radiology will follow up with this patient in the morning. IV heparin is to be held pending following day AM CBC demonstrating stable hemoglobin. Electronically Signed   By: Albin Felling M.D.   On: 11/11/2021 16:12   DG Chest Port 1 View  Result Date: 11/07/2021 CLINICAL DATA:  Status post thorenthesis EXAM: PORTABLE CHEST 1 VIEW COMPARISON:  Chest radiograph 10/22/2021. Chest CT November 06, 2021. FINDINGS: Similar versus mildly increased right midlung masslike opacity. There is some fluid tracking along the right minor fissure. No visible pneumothorax. No consolidation. Similar cardiomediastinal silhouette. IMPRESSION: 1. Similar versus mildly increased right midlung masslike opacity, further characterized on recent chest CT. There is some fluid tracking along the right minor fissure. 2. No visible pneumothorax status post thoracentesis. Electronically Signed   By: Margaretha Sheffield M.D.   On: 11/07/2021 14:43   ECHOCARDIOGRAM COMPLETE  Result Date: 11/07/2021     ECHOCARDIOGRAM REPORT   Patient Name:   FEVEN ALDERFER Date of Exam: 11/07/2021 Medical Rec #:  [id number]      Height:       65.5 in Accession #:    [id number]     Weight:       125.7 lb Date of Birth:  10/16/1955      BSA:          1.633 m Patient Age:    63 years       BP:           124/80 mmHg Patient Gender: F              HR:           78 bpm. Exam Location:  [facility] Procedure: 2D Echo, Cardiac Doppler and Color Doppler Indications:     Pulmonary Embolism I26.09  History:         Patient has no prior history of Echocardiogram examinations.                  Pulmonary emboli, acute pulmonary embolism.  Sonographer:     Sherrie Sport Referring Phys:  [id number] Lequita Halt Diagnosing Phys: Donnelly Angelica IMPRESSIONS  1. Left ventricular ejection fraction, by estimation, is 60 to 65%. The left ventricle has normal function. The left ventricle has no regional wall motion abnormalities. Left ventricular diastolic parameters are consistent with Grade I diastolic dysfunction (impaired relaxation).  2. Right ventricular systolic function is normal. The right ventricular size is mildly enlarged. There is mildly elevated pulmonary artery systolic pressure. The estimated right ventricular systolic pressure is 27.0 mmHg.  3. Focal thickening of MV leaflet. Correlate clinically. . The mitral valve is degenerative. Mild mitral valve regurgitation. No evidence of mitral stenosis.  4. The aortic valve is normal in structure. Aortic valve regurgitation is not visualized. No aortic stenosis is present.  5. The inferior vena cava is normal in size with <50% respiratory variability, suggesting right atrial pressure of 8 mmHg. FINDINGS  Left Ventricle: Left ventricular ejection fraction, by estimation, is 60 to 65%. The left ventricle has normal function. The left ventricle has no regional wall motion abnormalities. The left ventricular internal cavity size was normal  in size. There is  no left ventricular hypertrophy. Left ventricular diastolic  parameters are consistent with Grade I diastolic dysfunction (impaired relaxation). Right Ventricle: The right ventricular size is mildly enlarged. No increase in right ventricular wall thickness. Right ventricular systolic function is normal. There is mildly elevated pulmonary artery systolic pressure. The tricuspid regurgitant velocity is 2.81 m/s, and with an assumed right atrial pressure of 8 mmHg, the estimated right ventricular systolic pressure is 80.9 mmHg. Left Atrium: Left atrial size was normal in size. Right Atrium: Right atrial size was normal in size. Pericardium: There is no evidence of pericardial effusion. Mitral Valve: Focal thickening of MV leaflet. Correlate clinically. The mitral valve is degenerative in appearance. Mild mitral valve regurgitation. No evidence of mitral valve stenosis. MV peak gradient, 6.7 mmHg. The mean mitral valve gradient is 3.0 mmHg. Tricuspid Valve: The tricuspid valve is normal in structure. Tricuspid valve regurgitation is mild . No evidence of tricuspid stenosis. Aortic Valve: The aortic valve is normal in structure. Aortic valve regurgitation is not visualized. No aortic stenosis is present. Aortic valve mean gradient measures 3.0 mmHg. Aortic valve peak gradient measures 4.8 mmHg. Aortic valve area, by VTI measures 2.53 cm. Pulmonic Valve: The pulmonic valve was normal in structure. Pulmonic valve regurgitation is not visualized. No evidence of pulmonic stenosis. Aorta: The aortic root is normal in size and structure. Venous: The inferior vena cava is normal in size with less than 50% respiratory variability, suggesting right atrial pressure of 8 mmHg. IAS/Shunts: No atrial level shunt detected by color flow Doppler.  LEFT VENTRICLE PLAX 2D LVIDd:         3.70 cm   Diastology LVIDs:         2.30 cm   LV e' medial:    5.11 cm/s LV PW:         1.40 cm   LV E/e' medial:  15.9 LV IVS:        1.10 cm   LV e' lateral:   4.35 cm/s LVOT diam:     2.00 cm   LV E/e' lateral:  18.7 LV SV:         52 LV SV Index:   32 LVOT Area:     3.14 cm  RIGHT VENTRICLE RV Basal diam:  2.90 cm RV S prime:     13.10 cm/s TAPSE (M-mode): 2.1 cm LEFT ATRIUM             Index        RIGHT ATRIUM           Index LA diam:        2.50 cm 1.53 cm/m   RA Area:     14.40 cm LA Vol (A2C):   55.7 ml 34.11 ml/m  RA Volume:   34.30 ml  21.01 ml/m LA Vol (A4C):   50.6 ml 30.99 ml/m LA Biplane Vol: 53.7 ml 32.89 ml/m  AORTIC VALVE                    PULMONIC VALVE AV Area (Vmax):    2.69 cm     PV Vmax:        0.70 m/s AV Area (Vmean):   2.50 cm     PV Vmean:       47.133 cm/s AV Area (VTI):     2.53 cm     PV VTI:         0.128 m AV Vmax:  109.00 cm/s  PV Peak grad:   1.9 mmHg AV Vmean:          74.700 cm/s  PV Mean grad:   1.0 mmHg AV VTI:            0.206 m      RVOT Peak grad: 3 mmHg AV Peak Grad:      4.8 mmHg AV Mean Grad:      3.0 mmHg LVOT Vmax:         93.30 cm/s LVOT Vmean:        59.400 cm/s LVOT VTI:          0.166 m LVOT/AV VTI ratio: 0.81  AORTA Ao Root diam: 3.17 cm MITRAL VALVE                TRICUSPID VALVE MV Area (PHT): 3.17 cm     TR Peak grad:   31.6 mmHg MV Area VTI:   1.83 cm     TR Vmax:        281.00 cm/s MV Peak grad:  6.7 mmHg MV Mean grad:  3.0 mmHg     SHUNTS MV Vmax:       1.29 m/s     Systemic VTI:  0.17 m MV Vmean:      75.9 cm/s    Systemic Diam: 2.00 cm MV Decel Time: 239 msec     Pulmonic VTI:  0.158 m MV E velocity: 81.40 cm/s MV A velocity: 109.00 cm/s MV E/A ratio:  0.75 Donnelly Angelica Electronically signed by Donnelly Angelica Signature Date/Time: 11/07/2021/1:06:16 PM    Final    US Abdomen Limited RUQ (LIVER/GB)  Result Date: 11/13/2021 CLINICAL DATA:  Status post liver biopsy 11/11/2021. Evaluate for hepatic hematoma. EXAM: ULTRASOUND ABDOMEN LIMITED RIGHT UPPER QUADRANT COMPARISON:  Biopsy 11/11/2021.  CT 11/06/2021. FINDINGS: Gallbladder: No gallstones or wall thickening visualized. No sonographic Murphy sign noted by sonographer. Common bile duct: Diameter:  Normal, 2 mm. Liver: No evidence of intraparenchymal or perihepatic hematoma. Again identified are multiple liver lesions, including at up to 1.2 cm in the right liver lobe. These demonstrate hypoechogenicity with central echogenicity. Portal vein is patent on color Doppler imaging with normal direction of blood flow towards the liver. Other: None. IMPRESSION: No evidence of procedure related intraparenchymal or perihepatic hematoma. Multiple liver lesions, as before. These demonstrate a morphology which is suspicious for metastatic disease. Electronically Signed   By: Abigail Miyamoto M.D.   On: 11/13/2021 16:24   US THORACENTESIS ASP PLEURAL SPACE W/IMG GUIDE  Result Date: 11/07/2021 INDICATION: 66 year old with right lung mass and concern for metastatic disease. EXAM: ULTRASOUND GUIDED RIGHT THORACENTESIS MEDICATIONS: None. COMPLICATIONS: None immediate. PROCEDURE: An ultrasound guided thoracentesis was thoroughly discussed with the patient and questions answered. The benefits, risks, alternatives and complications were also discussed. The patient understands and wishes to proceed with the procedure. Written consent was obtained. Ultrasound was performed to localize and mark an adequate pocket of fluid in the right chest. The area was then prepped and draped in the normal sterile fashion. 1% Lidocaine was used for local anesthesia. Under ultrasound guidance a 6 Fr Safe-T-Centesis catheter was introduced. Thoracentesis was performed. The catheter was removed and a dressing applied. FINDINGS: A total of approximately 350 mL of amber colored fluid was removed. Samples were sent to the laboratory as requested by the clinical team. IMPRESSION: Successful ultrasound guided right thoracentesis yielding 350 mL of pleural fluid. Electronically Signed   By: Scherrie Gerlach.D.  On: 11/07/2021 15:01    Assessment and plan-   Patient is a 66 y.o. female with known DVT on Eliquis presented with chest pain and unsteady gait.    work-up showed pulmonary embolism, right pleural effusion, lung mass with skeletal liver lesions, metastatic brain lesions and stroke.   #Thrombocytopenia, Acute pulmonary embolism, lower extremity DVT, recurrent embolic stroke, hypercoagulable state secondary to extensive malignancy, recommend patient to continue anticoagulation. HIT antibody came back negative, ok to switch back to heparin gtt. Recommend continue heparin gtt for now and transition to Lovenox 1mg /kg BID at discharge.   #Stage IV lung cancer with liver, bone, brain metastasis.   Patient prefers to continue current scope of treatment, wait for NGS results. I encourage her to improve nutrition, participate in physical therapy, if stable to be discharge, she will follow up [facility] for possible immunotherapy+/- chemotherapy. She will also need brain RT.  If she has targetable mutation, will switch to target therapy   # Pain control, oxycodone 10mg  Q4h PRN, Dilaudid PRN # Drug induced constipation Add colace 100mg  BID. Continue Miralax PRN CODE STATUS DNR/DNI.  #acute kidney failure, IV hydration.  #Leukocytosis, chest x-ray showed no pneumonia.  Likely reactive to multiple infarcts.  PT/OT.   Thank you for to allowing me to participate in the care of this patient.   Earlie Server, MD, PhD Hematology Oncology 11/15/2021

## 2021-11-15 NOTE — Consult Note (Signed)
ANTICOAGULATION CONSULT NOTE   Pharmacy Consult for argatroban Indication: h/o DVT >> presenting w/ pulmonary embolus and NEW embolic stroke  Allergies  Allergen Reactions   Sulfa Antibiotics Nausea Only and Nausea And Vomiting    Patient Measurements: Height: 5' 5.51" (166.4 cm) Weight: 56 kg (123 lb 7.3 oz) IBW/kg (Calculated) : 58.18  Vital Signs: Temp: 98.5 F (36.9 C) (02/10 0725) Temp Source: Oral (02/10 0446) BP: 109/68 (02/10 0725) Pulse Rate: 100 (02/10 0725)  Labs: Recent Labs    11/13/21 0609 11/14/21 0510 11/14/21 2004 11/15/21 0317 11/15/21 0942  HGB 10.1* 9.6*  --  9.2*  --   HCT 32.1* 29.7*  --  28.0*  --   PLT 120* 93*  --  127*  --   APTT  --   --  126* 94* 96*  CREATININE 1.26* 1.10*  --  1.26*  --      Estimated Creatinine Clearance: 38.8 mL/min (A) (by C-G formula based on SCr of 1.26 mg/dL (H)).   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  Assessment: patient is a 66 year old female recently diagnosed with a DVT about 3 weeks ago who is on Eliquis PTA now presents with some left upper abdominal pain and back pain for several weeks.  On 2/7, CT positive for pulmonary emboli.   On 2/8 PM, patient with new embolic stroke. MRI brain: "Compared to the recent prior MRI, there are multiple new small foci of cortical and subcortical restricted diffusion in both cerebral hemispheres primarily in the frontal lobes. Some  of these have an appearance most suggestive of subacute embolic infarcts"  On 2/9: Per d/w Heme/Onc and neuro will transition heparin to argatroban during HITT w/u.  Patient's 4T score of 5-6 points Intermediate/High risk (PLT >50% drop & Nadir >20 -- 2; Fall in 5-10d -- 2; progressive DVT>PE -- 1; TCP alternative causes likely/definite -- 1).   PF4 Ab ordered -- pending.   Goal of Therapy:  aPTT 50-90s seconds Monitor platelets by anticoagulation protocol: Yes  2/09 2004 aPTT 126, supratherapeutic 2/10 0317 aPTT 94, supratherapeutic 2/10 0942 aPTT 96, supratherapeutic   Plan:  aPTT supratherapeutic Decrease argatroban infusion to 0.3 mcg/kg/min Recheck aPTT 4 hr after rate change Continue to monitor CBC daily  Tawnya Crook, PharmD, BCPS Clinical Pharmacist 11/15/2021 10:59 AM

## 2021-11-15 NOTE — Progress Notes (Signed)
Physical Therapy Treatment Patient Details Name: Cheryl Schroeder MRN: [id number] DOB: Nov 15, 1955 Today's Date: 11/15/2021   History of Present Illness Patient is a 66 year old female who reports to [facility] ED with complaints of continuous epigastric pain for the past 3 weeks. Patient admitted for acute pulmonary embolism. Left pulmonary thrombectomy for PE/DVT completed on 11/08/21. Patient has a PMH (+) for bipolar disorder, fibromyalgia, abdominal hysterectomy.    PT Comments    Pt was long sitting in bed with supportive sister and niece in room. Pt is lethargic but with encouragement agrees to session. Author knows pt well outside of hospital. She is not currently at baseline cognition however author suspects its due to medications. She is extremely weak and is currently having more deficits with R side extremities than left. With encouragement she was able to exit L side of bed, stand, and take several labored steps to [facility]. Attempted to have BM but was unable. She stood and took several steps back to bed. Pt is extremely weak however per lengthy discussion, does not want to go to rehab. Per sister, "We will take her home when she is able." Author discussed equipment needs to safely manage at home. All are in agreement for equipment when/if pt becomes stable enough to return home.    Recommendations for follow up therapy are one component of a multi-disciplinary discharge planning process, led by the attending physician.  Recommendations may be updated based on patient status, additional functional criteria and insurance authorization.  Follow Up Recommendations  Other (comment) (Pt/family do not want to persue rehab as option at DC. They want pt to DC home with all neccessary equipment and assistance available. Family is aware of the amount of assistance she will require. Any and all HH services recommended.)     Assistance Recommended at Discharge Frequent or constant Supervision/Assistance      Equipment Recommendations  Rolling walker (2 wheels);BSC/3in1;Wheelchair (measurements PT);Wheelchair cushion (measurements PT);Hospital bed       Precautions / Restrictions Precautions Precautions: Fall Restrictions Weight Bearing Restrictions: No     Mobility  Bed Mobility Overal bed mobility: Needs Assistance Bed Mobility: Supine to Sit, Sit to Supine     Supine to sit: Mod assist, Max assist, HOB elevated Sit to supine: Max assist, HOB elevated   General bed mobility comments: Pt is extremely weak. required more assistance to exit bed and to return to bed after OOB activity.    Transfers Overall transfer level: Needs assistance Equipment used: Rolling walker (2 wheels) Transfers: Sit to/from Stand Sit to Stand: Min assist, Mod assist, From elevated surface           General transfer comment: Pt required more assistance to stand today. she is very weak throughout all extremities however was willing to get OOB to [facility] to attempt BM. unsuccessful BM.    Ambulation/Gait Ambulation/Gait assistance: Min assist Gait Distance (Feet): 3 Feet Assistive device: Rolling walker (2 wheels) Gait Pattern/deviations: Step-to pattern, Shuffle, Trunk flexed Gait velocity: mildly decreased     General Gait Details: Pt is very weak and requiring more assistance with all mobility, transfers, and gait.    Balance Overall balance assessment: Needs assistance Sitting-balance support: Feet supported, Bilateral upper extremity supported Sitting balance-Leahy Scale: Poor Sitting balance - Comments: pt very weak and somewhat lethargic today. poor sitting balance at EOB   Standing balance support: Bilateral upper extremity supported, During functional activity Standing balance-Leahy Scale: Poor Standing balance comment: high fall risk even with BUE  support      Cognition Arousal/Alertness: Lethargic, Suspect due to medications Behavior During Therapy: WFL for tasks  assessed/performed Overall Cognitive Status: Within Functional Limits for tasks assessed      General Comments: Pt is A and oriented x 3. Author knows pt well outside of hospital. Cognition is not at baseline.               Pertinent Vitals/Pain Pain Assessment Pain Assessment: 0-10 Pain Score: 7  Pain Location: R side/ stomach pan (needs to have BM) Pain Descriptors / Indicators: Aching, Constant Pain Intervention(s): Limited activity within patient's tolerance, Monitored during session, Premedicated before session, Repositioned     PT Goals (current goals can now be found in the care plan section) Acute Rehab PT Goals Patient Stated Goal: to go home Progress towards PT goals: Not progressing toward goals - comment (Pt has a medical decline in status since previous session. May need to be re-eval next session due to these changes.)    Frequency    Min 2X/week      PT Plan Current plan remains appropriate       AM-PAC PT "6 Clicks" Mobility   Outcome Measure  Help needed turning from your back to your side while in a flat bed without using bedrails?: A Lot Help needed moving from lying on your back to sitting on the side of a flat bed without using bedrails?: A Lot Help needed moving to and from a bed to a chair (including a wheelchair)?: A Lot Help needed standing up from a chair using your arms (e.g., wheelchair or bedside chair)?: A Lot Help needed to walk in hospital room?: A Lot Help needed climbing 3-5 steps with a railing? : Total 6 Click Score: 11    End of Session Equipment Utilized During Treatment: Gait belt Activity Tolerance: Patient limited by fatigue;Patient limited by lethargy;Patient limited by pain Patient left: in bed;with call bell/phone within reach;with bed alarm set Nurse Communication: Mobility status PT Visit Diagnosis: Muscle weakness (generalized) (M62.81);Unsteadiness on feet (R26.81);Difficulty in walking, not elsewhere classified  (R26.2);Other symptoms and signs involving the nervous system (N05.397)     Time: 6734-1937 PT Time Calculation (min) (ACUTE ONLY): 40 min  Charges:  $Therapeutic Activity: 38-52 mins                     Julaine Fusi PTA 11/15/21, 2:03 PM

## 2021-11-15 NOTE — Progress Notes (Signed)
PROGRESS NOTE    Cheryl Schroeder  WJX:[id number] DOB: 1956-08-18 DOA: 11/06/2021 PCP: Lynnell Jude, MD    Brief Narrative:  66 year old woman PMH recent outpatient diagnosis DVT treated with apixaban, presented with chest pain and frequent falls.  CT revealed acute saddle embolism and multiple PE as well as right middle lung mass and skeletal lesions concerning for malignancy.  Seen by pulmonology with recommendation for malignancy work-up. S/p right-sided thoracentesis. Seen by vascular surgery and underwent left pulmonary mechanical thrombectomy as well as placement of IVC.  Brain MRI concerning for metastatic disease as well as subacute infarcts.  Neurology consulted.  Awaiting cytology from thoracentesis.  Given need for anticoagulation, obtained liver biopsy prior to transition to oral anticoagulation.  Patient had a new onset of right arm weakness on 2/8, MRI of the brain showed new embolic strokes.  Heparin drip started, but due to significant drop in platelets, anticoagulation changed to argatroban 2/9.   Assessment & Plan:   Principal Problem:   Acute pulmonary embolism (HCC) Active Problems:   Malignant pleural effusion   Mass of right lung   Bone lesion   Liver lesion   Demand ischemia (HCC)   Acquired thrombophilia (HCC)   DVT of leg (deep venous thrombosis) (HCC)   Thyroid nodule   Renal lesion   Brain mass   CVA (cerebral vascular accident) ([place])   Body mass index (BMI) 19.9 or less, adult   Liver hematoma   Liver metastases (HCC)   Pleural effusion   Orthostatic hypotension   Palliative care encounter  New embolic stroke. Anticoagulation was changed to argatroban. Patient right arm weakness is getting better.   Thrombocytopenia. Worsening leukocytosis. Anemia.  No evidence of bacterial infection.  HIT antibody sent out by oncology, anticoagulation changed to argatroban.     Right lung mass. Brain metastasis with right arm weakness. Metastasis to the  liver and the bone. Thyroid nodule. Renal lesion. Patient is a followed up by oncology, still considering chemotherapy and immunotherapy. Still has significant abdominal pain with some constipation.  We will give a dose of lactulose.  Pain medicine was also increased dose.     Orthostatic hypotension. Acute kidney injury. Hyperkalemia secondary to acute kidney injury. Hyponatremia. Sodium level is low today at 130, renal function still stable. I will add a salt tablet.  Liver hematoma secondary to liver biopsy. Repeated right upper quadrant ultrasound did not show any hematoma, contusion has improved.   Acute pulm embolism. DVT. Acquired thrombophilia. Demand ischemia with elevated troponin secondary to PE. Continue argatroban while waiting for HIT antibody.  Malignant pleural effusion. S/p thoracentesis.   Urinary retention. Residual over 831ml, Foley catheter anchored,  continue Flomax.     DVT prophylaxis: Argatroban Code Status: DNR Family Communication: Sister updated Disposition Plan:      Status is: Inpatient   Remains inpatient appropriate because: Severity of disease     I/O last 3 completed shifts: In: 296.2 [P.O.:240; I.V.:56.2] Out: 1500 [Urine:1500] No intake/output data recorded.     Consultants:  Oncology  Procedures: Liver biopsy.  Antimicrobials: None  Subjective: Patient still has significant pain in the left side abdomen, she has some constipation.  She is tolerating diet, no nausea vomiting. Denies any chest pain or shortness of breath.   Objective: Vitals:   11/14/21 2025 11/15/21 0014 11/15/21 0446 11/15/21 0725  BP: 108/62 121/63 108/72 109/68  Pulse: (!) 108 96 98 100  Resp: 16 16 16 19   Temp: 99.6 F (37.6 C)  99 F (37.2 C) 99.2 F (37.3 C) 98.5 F (36.9 C)  TempSrc: Oral  Oral   SpO2:  95% 95% 94%  Weight:      Height:        Intake/Output Summary (Last 24 hours) at 11/15/2021 1104 Last data filed at 11/15/2021  0451 Gross per 24 hour  Intake 56.15 ml  Output 1500 ml  Net -1443.85 ml   Filed Weights   11/07/21 0413 11/09/21 0500 11/11/21 0458  Weight: 57 kg 54.6 kg 56 kg    Examination:  General exam: Appears calm and comfortable  Respiratory system: Clear to auscultation. Respiratory effort normal. Cardiovascular system: S1 & S2 heard, RRR. No JVD, murmurs, rubs, gallops or clicks. No pedal edema. Gastrointestinal system: Abdomen is nondistended, soft and Left mid abdomen tender. No organomegaly or masses felt. Normal bowel sounds heard. Central nervous system: Alert and oriented. No focal neurological deficits. Extremities: Symmetric 5 x 5 power. Skin: No rashes, lesions or ulcers Psychiatry: Judgement and insight appear normal. Mood & affect appropriate.     Data Reviewed: I have personally reviewed following labs and imaging studies  CBC: Recent Labs  Lab 11/11/21 0551 11/12/21 0552 11/13/21 0609 11/14/21 0510 11/15/21 0317  WBC 8.2 15.1* 14.6* 20.1* 21.0*  HGB 9.3* 10.4* 10.1* 9.6* 9.2*  HCT 29.2* 33.2* 32.1* 29.7* 28.0*  MCV 93.3 94.1 93.0 92.0 92.1  PLT 235 143* 120* 93* 024*   Basic Metabolic Panel: Recent Labs  Lab 11/10/21 0441 11/11/21 0551 11/13/21 0609 11/13/21 1221 11/14/21 0510 11/15/21 0317  NA 135 140 136  --  132* 130*  K 4.8 4.1 5.3* 4.4 4.3 4.0  CL 102 107 101  --  96* 95*  CO2 28 30 26   --  25 26  GLUCOSE 102* 103* 115*  --  138* 133*  BUN 14 19 37*  --  35* 39*  CREATININE 0.88 0.82 1.26*  --  1.10* 1.26*  CALCIUM 9.3 9.0 9.6  --  9.0 8.5*  MG  --   --  2.4  --  2.4 2.5*   GFR: Estimated Creatinine Clearance: 38.8 mL/min (A) (by C-G formula based on SCr of 1.26 mg/dL (H)). Liver Function Tests: No results for input(s): AST, ALT, ALKPHOS, BILITOT, PROT, ALBUMIN in the last 168 hours. No results for input(s): LIPASE, AMYLASE in the last 168 hours. No results for input(s): AMMONIA in the last 168 hours. Coagulation Profile: No results for  input(s): INR, PROTIME in the last 168 hours. Cardiac Enzymes: No results for input(s): CKTOTAL, CKMB, CKMBINDEX, TROPONINI in the last 168 hours. BNP (last 3 results) No results for input(s): PROBNP in the last 8760 hours. HbA1C: No results for input(s): HGBA1C in the last 72 hours. CBG: No results for input(s): GLUCAP in the last 168 hours. Lipid Profile: No results for input(s): CHOL, HDL, LDLCALC, TRIG, CHOLHDL, LDLDIRECT in the last 72 hours. Thyroid Function Tests: No results for input(s): TSH, T4TOTAL, FREET4, T3FREE, THYROIDAB in the last 72 hours. Anemia Panel: Recent Labs    11/14/21 0902  VITAMINB12 913  FOLATE 15.7   Sepsis Labs: No results for input(s): PROCALCITON, LATICACIDVEN in the last 168 hours.  Recent Results (from the past 240 hour(s))  Resp Panel by RT-PCR (Flu A&B, Covid) Nasopharyngeal Swab     Status: None   Collection Time: 11/06/21  3:49 PM   Specimen: Nasopharyngeal Swab; Nasopharyngeal(NP) swabs in vial transport medium  Result Value Ref Range Status   SARS Coronavirus 2 by  RT PCR NEGATIVE NEGATIVE Final    Comment: (NOTE) SARS-CoV-2 target nucleic acids are NOT DETECTED.  The SARS-CoV-2 RNA is generally detectable in upper respiratory specimens during the acute phase of infection. The lowest concentration of SARS-CoV-2 viral copies this assay can detect is 138 copies/mL. A negative result does not preclude SARS-Cov-2 infection and should not be used as the sole basis for treatment or other patient management decisions. A negative result may occur with  improper specimen collection/handling, submission of specimen other than nasopharyngeal swab, presence of viral mutation(s) within the areas targeted by this assay, and inadequate number of viral copies(<138 copies/mL). A negative result must be combined with clinical observations, patient history, and epidemiological information. The expected result is Negative.  Fact Sheet for Patients:   EntrepreneurPulse.com.au  Fact Sheet for Healthcare Providers:  IncredibleEmployment.be  This test is no t yet approved or cleared by the [place] FDA and  has been authorized for detection and/or diagnosis of SARS-CoV-2 by FDA under an Emergency Use Authorization (EUA). This EUA will remain  in effect (meaning this test can be used) for the duration of the COVID-19 declaration under Section 564(b)(1) of the Act, 21 U.S.C.section 360bbb-3(b)(1), unless the authorization is terminated  or revoked sooner.       Influenza A by PCR NEGATIVE NEGATIVE Final   Influenza B by PCR NEGATIVE NEGATIVE Final    Comment: (NOTE) The Xpert Xpress SARS-CoV-2/FLU/RSV plus assay is intended as an aid in the diagnosis of influenza from Nasopharyngeal swab specimens and should not be used as a sole basis for treatment. Nasal washings and aspirates are unacceptable for Xpert Xpress SARS-CoV-2/FLU/RSV testing.  Fact Sheet for Patients: EntrepreneurPulse.com.au  Fact Sheet for Healthcare Providers: IncredibleEmployment.be  This test is not yet approved or cleared by the [place] FDA and has been authorized for detection and/or diagnosis of SARS-CoV-2 by FDA under an Emergency Use Authorization (EUA). This EUA will remain in effect (meaning this test can be used) for the duration of the COVID-19 declaration under Section 564(b)(1) of the Act, 21 U.S.C. section 360bbb-3(b)(1), unless the authorization is terminated or revoked.  Performed at [facility], [address] [place], [place] [address]   CULTURE, BLOOD (ROUTINE X 2) w Reflex to ID Panel     Status: None (Preliminary result)   Collection Time: 11/07/21  4:37 PM   Specimen: BLOOD  Result Value Ref Range Status   Specimen Description BLOOD LEFT ANTECUBITAL  Final   Special Requests   Final    BOTTLES DRAWN AEROBIC AND ANAEROBIC Blood Culture  adequate volume   Culture   Final    NO GROWTH 4 DAYS Performed at [facility], [address] [place], [place] [address]    Report Status PENDING  Incomplete  CULTURE, BLOOD (ROUTINE X 2) w Reflex to ID Panel     Status: None (Preliminary result)   Collection Time: 11/07/21  4:38 PM   Specimen: BLOOD  Result Value Ref Range Status   Specimen Description BLOOD BLOOD LEFT ARM  Final   Special Requests   Final    BOTTLES DRAWN AEROBIC AND ANAEROBIC Blood Culture adequate volume   Culture   Final    NO GROWTH 4 DAYS Performed at [facility], [address] [place], [place] [address]    Report Status PENDING  Incomplete         Radiology Studies: DG Chest 2 View  Result Date: 11/14/2021 CLINICAL DATA:  Pneumonia EXAM: CHEST -  2 VIEW COMPARISON:  Chest radiograph 11/07/2021 FINDINGS: Normal cardiac silhouette. RIGHT perihilar mass measuring 5 cm again noted. Mild atelectasis along the LEFT midlung fissure. Lytic lesion in the LEFT humerus. IMPRESSION: 1. No acute findings. 2. Large RIGHT perihilar mass 3. Lytic lesion in the LEFT humerus. Electronically Signed   By: Suzy Bouchard M.D.   On: 11/14/2021 08:32   MR BRAIN W WO CONTRAST  Result Date: 11/13/2021 CLINICAL DATA:  Brain metastases suspected. EXAM: MRI HEAD WITHOUT AND WITH CONTRAST TECHNIQUE: Multiplanar, multiecho pulse sequences of the brain and surrounding structures were obtained without and with intravenous contrast. CONTRAST:  81mL GADAVIST GADOBUTROL 1 MMOL/ML IV SOLN COMPARISON:  Head MRI 11/09/2021 FINDINGS: Brain: Compared to the recent prior MRI, there are multiple new small foci of cortical and subcortical restricted diffusion in both cerebral hemispheres primarily in the frontal lobes (with examples annotated with arrows on series 5), and there are also new small foci of restricted diffusion in both cerebellar hemispheres. These are nonenhancing and consistent with interval acute infarcts.  Numerous other small foci of milder diffusion weighted signal abnormality are again seen throughout both cerebral hemispheres and cerebellum as described on the recent prior MRI, and multiple small foci of enhancement are again seen throughout both cerebral hemispheres, some of which correspond to some of the areas of mild diffusion weighted signal abnormality while many do not. These enhancing foci demonstrate mild mixed interval changes compared to the recent prior MRI, with some appearing slightly larger, others appearing slightly regressed, and others not as well seen due to motion artifact on the current examination. A 10 mm ring-enhancing lesion with hemosiderin deposition in the inferior left frontal lobe is unchanged (series 18, image 86), as is a 6 mm ring-enhancing lesion in the inferior right temporal lobe (series 18, image 52). No midline shift or extra-axial fluid collection is evident. The ventricles and sulci are normal. There is a background of mild chronic small vessel ischemia in the cerebral white matter. Vascular: Hypoplastic, poorly visualized distal right vertebral artery more fully evaluated on recent CTA. Other major intracranial vascular flow voids are preserved. Skull and upper cervical spine: Multiple bone metastases in the cervical spine, more fully evaluated on today's separate dedicated spine MRI. Unchanged subcentimeter enhancing focus in the right occipital skull. Sinuses/Orbits: Unremarkable orbits.  Small right mastoid effusion. Other: None. IMPRESSION: 1. Multiple small acute cerebral and cerebellar infarcts which are new from 11/09/2021. 2. Numerous small foci of enhancement throughout both cerebral hemispheres with very mild mixed interval changes since 11/09/2021. Some of these have an appearance most suggestive of subacute embolic infarcts, while others are more suspicious for metastatic disease (specifically ring-enhancing lesions in the inferior left frontal lobe and right  temporal lobe). Short interval imaging follow-up may be helpful for differentiation. 3. Known osseous metastatic disease. Electronically Signed   By: Logan Bores M.D.   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 Recent pulmonary embolism. EXAM: MRI CERVICAL SPINE WITHOUT AND WITH CONTRAST TECHNIQUE: Multiplanar and multiecho pulse sequences of the cervical spine, to include the craniocervical junction and cervicothoracic junction, were obtained without and with intravenous contrast. CONTRAST:  55mL GADAVIST GADOBUTROL 1 MMOL/ML IV SOLN COMPARISON:  Chest CT 05/26/2022. Cervical MRI 11/10/2008. Cranial MRI 11/09/2021 and today. FINDINGS: Alignment: Physiologic. Vertebrae: Multifocal osseous metastatic disease throughout the cervical and upper thoracic spine, involving all segments except C3. Probable associated  pathologic fractures at C5, C6 and T3 without significant osseous retropulsion or epidural tumor. Probable necrotic metastasis with peripheral enhancement at the right T2 costovertebral junction, measuring up to 1.9 cm on image 2/9. Cord: Normal in signal and caliber. No abnormal intradural enhancement. 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 See separate MRI brain report.Bilateral vertebral artery flow voids. There is a complex enhancing mass involving the left thyroid lobe, incompletely visualized this measures up to 2.8 cm on sagittal image 12/15. Disc levels: Relatively mild multilevel spondylosis with disc bulging and uncinate spurring greatest at C5-6 and C6-7. There are scattered facet degenerative changes. No evidence of cord compression. Mild multilevel foraminal narrowing, greatest on the left at C4-5 and on the right at C6-7. IMPRESSION: 1. Multifocal osseous metastatic disease  within the visualized cervicothoracic spine with probable pathologic fractures at C5, C6 and T3. No cord compression. 2. Relatively mild multilevel spondylosis with scattered mild foraminal narrowing. 3. Right cerebellar vermian infarcts.  See separate MRI head report. 4. Complex enhancing left thyroid nodule. Given comorbidities, this may not be clinically significant, although could be further evaluated with thyroid ultrasound and biopsy as clinically warranted. Electronically Signed   By: Richardean Sale M.D.   On: 11/13/2021 15:59   US Abdomen Limited RUQ (LIVER/GB)  Result Date: 11/13/2021 CLINICAL DATA:  Status post liver biopsy 11/11/2021. Evaluate for hepatic hematoma. EXAM: ULTRASOUND ABDOMEN LIMITED RIGHT UPPER QUADRANT COMPARISON:  Biopsy 11/11/2021.  CT 11/06/2021. FINDINGS: Gallbladder: No gallstones or wall thickening visualized. No sonographic Murphy sign noted by sonographer. Common bile duct: Diameter: Normal, 2 mm. Liver: No evidence of intraparenchymal or perihepatic hematoma. Again identified are multiple liver lesions, including at up to 1.2 cm in the right liver lobe. These demonstrate hypoechogenicity with central echogenicity. Portal vein is patent on color Doppler imaging with normal direction of blood flow towards the liver. Other: None. IMPRESSION: No evidence of procedure related intraparenchymal or perihepatic hematoma. Multiple liver lesions, as before. These demonstrate a morphology which is suspicious for metastatic disease. Electronically Signed   By: Abigail Miyamoto M.D.   On: 11/13/2021 16:24        Scheduled Meds:  (feeding supplement) PROSource Plus  30 mL Oral BID BM   FLUoxetine  40 mg Oral Daily   fluticasone  1 spray Each Nare Daily   gabapentin  300 mg Oral TID   lamoTRIgine  200 mg Oral Daily   multivitamin with minerals  1 tablet Oral Daily   pantoprazole  40 mg Oral Daily   polyethylene glycol  17 g Oral Daily   pravastatin  40 mg Oral QHS   senna-docusate   2 tablet Oral BID   sodium chloride flush  3 mL Intravenous Q12H   tamsulosin  0.4 mg Oral QPC supper   Continuous Infusions:  sodium chloride     argatroban 0.4 mcg/kg/min (11/15/21 0424)     LOS: 9 days    Time spent: 35 minutes    Sharen Hones, MD [facility]   To contact the attending provider between 7A-7P or the covering provider during after hours 7P-7A, please log into the web site [email or web address] and access using universal [place] password for that web site. If you do not have the password, please call the hospital operator.  11/15/2021, 11:04 AM

## 2021-11-15 NOTE — Consult Note (Signed)
ANTICOAGULATION CONSULT NOTE   Pharmacy Consult for heparin Indication: h/o DVT >> presenting w/ pulmonary embolus and NEW embolic stroke  Allergies  Allergen Reactions   Sulfa Antibiotics Nausea Only and Nausea And Vomiting    Patient Measurements: Height: 5' 5.51" (166.4 cm) Weight: 56 kg (123 lb 7.3 oz) IBW/kg (Calculated) : 58.18 Heparin Dosing weight: 55.5 kg  Vital Signs: Temp: 98.4 F (36.9 C) (02/10 1314) Temp Source: Oral (02/10 0446) BP: 100/55 (02/10 1314) Pulse Rate: 99 (02/10 1314)  Labs: Recent Labs    11/13/21 0609 11/14/21 0510 11/14/21 2004 11/15/21 0317 11/15/21 0942  HGB 10.1* 9.6*  --  9.2*  --   HCT 32.1* 29.7*  --  28.0*  --   PLT 120* 93*  --  127*  --   APTT  --   --  126* 94* 96*  CREATININE 1.26* 1.10*  --  1.26*  --      Estimated Creatinine Clearance: 38.8 mL/min (A) (by C-G formula based on SCr of 1.26 mg/dL (H)).   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   Acute pulmonary embolism ([place]) 11/06/2021   Bipolar 2 disorder (HCC)    Body mass index (BMI) 19.9 or less, adult 11/09/2021   CVA (cerebral vascular accident) ([place]) 11/09/2021   DVT of leg (deep venous thrombosis) ([place]) 11/09/2021   Dysplastic nevus    R calf, txted in past, pt states ~2002   Fibromyalgia    Psoriasis    Thyroid nodule 11/09/2021    Medications:  Apixaban 5mg  BID (for DVT lower extremity) >> xarelto >> heparin >> argatroban >> heparin  Assessment: patient is a 66 year old female recently diagnosed with a DVT about 3 weeks ago who is on Eliquis PTA now presents with some left upper abdominal pain and back pain for several weeks.  On 2/7, CT positive for pulmonary emboli.   On 2/8 PM, patient with new embolic stroke. MRI brain: "Compared to the recent prior MRI, there are multiple new small foci of cortical and subcortical restricted diffusion in both cerebral hemispheres  primarily in the frontal lobes. Some of these have an appearance most suggestive of subacute embolic infarcts"  On 2/9: Per d/w Heme/Onc and neuro will transition heparin to argatroban during HITT w/u.  Patient's 4T score of 5-6 points Intermediate/High risk (PLT >50% drop & Nadir >20 -- 2; Fall in 5-10d -- 2; progressive DVT>PE -- 1; TCP alternative causes likely/definite -- 1).  2/10: HIT panel negative, per d/w Heme/Onc will transition back to heparin drip.   Goal of Therapy:  Heparin level 0.3-0.7 units/ml Monitor platelets by anticoagulation protocol: Yes  Date Time HL/aPTT Rate/comment 2/09  2004  aPTT 126 supratherapeutic 2/10  0317  aPTT 94 supratherapeutic 2/10  0942  aPTT 96 supratherapeutic   Plan:  Stop argatroban at 1700 Start heparin infusion at 900 units/hr with no bolus Check aPTT and heparin level in 6 hours. If levels correlate may continue to monitor by heparin level only.  Continue to monitor H&H and platelets  Darnelle Bos, PharmD Clinical Pharmacist 11/15/2021 4:18 PM

## 2021-11-15 NOTE — Care Management Important Message (Signed)
Important Message  Patient Details  Name: TRISA CRANOR MRN: [id number] Date of Birth: 1956/03/08   Medicare Important Message Given:  Yes     Juliann Pulse A Joshuwa Vecchio 11/15/2021, 2:58 PM

## 2021-11-15 NOTE — Consult Note (Signed)
ANTICOAGULATION CONSULT NOTE   Pharmacy Consult for argatroban Indication: h/o DVT >> presenting w/ pulmonary embolus and NEW embolic stroke  Allergies  Allergen Reactions   Sulfa Antibiotics Nausea Only and Nausea And Vomiting    Patient Measurements: Height: 5' 5.51" (166.4 cm) Weight: 56 kg (123 lb 7.3 oz) IBW/kg (Calculated) : 58.18  Vital Signs: Temp: 99 F (37.2 C) (02/10 0014) Temp Source: Oral (02/09 2025) BP: 121/63 (02/10 0014) Pulse Rate: 96 (02/10 0014)  Labs: Recent Labs    11/12/21 0552 11/13/21 0609 11/14/21 0510 11/14/21 2004 11/15/21 0317  HGB 10.4* 10.1* 9.6*  --   --   HCT 33.2* 32.1* 29.7*  --   --   PLT 143* 120* 93*  --   --   APTT  --   --   --  126* 94*  HEPARINUNFRC 0.31  --   --   --   --   CREATININE  --  1.26* 1.10*  --  1.26*     Estimated Creatinine Clearance: 38.8 mL/min (A) (by C-G formula based on SCr of 1.26 mg/dL (H)).   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  Assessment: patient is a 66 year old female recently diagnosed with a DVT about 3 weeks ago who is on Eliquis PTA now presents with some left upper abdominal pain and back pain for several weeks.  On 2/7, CT positive for pulmonary emboli.   On 2/8 PM, patient with new embolic stroke. MRI brain: "Compared to the recent prior MRI, there are multiple new small foci of cortical and subcortical restricted diffusion in both cerebral hemispheres  primarily in the frontal lobes. Some of these have an appearance most suggestive of subacute embolic infarcts"  On 2/9: Per d/w Heme/Onc and neuro will transition heparin to argatroban during HITT w/u.  Patient's 4T score of 5-6 points Intermediate/High risk (PLT >50% drop & Nadir >20 -- 2; Fall in 5-10d -- 2; progressive DVT>PE -- 1; TCP alternative causes likely/definite -- 1).   PF4 Ab ordered -- pending.   Goal of Therapy:  aPTT 50-90s seconds Monitor platelets by anticoagulation protocol: Yes  2/10 0317 aPTT 94, supratherapeutic   Plan:  aPTT supratherapeutic Decrease argatrobaninfusion to 0.4 mcg/kg/min Recheck aPTT in 4 hr after rate change Continue to monitor CBC daily  Renda Rolls, PharmD, [facility] 11/15/2021 4:22 AM

## 2021-11-16 DIAGNOSIS — C3490 Malignant neoplasm of unspecified part of unspecified bronchus or lung: Secondary | ICD-10-CM

## 2021-11-16 LAB — BASIC METABOLIC PANEL
Anion gap: 12 (ref 5–15)
BUN: 36 mg/dL — ABNORMAL HIGH (ref 8–23)
CO2: 27 mmol/L (ref 22–32)
Calcium: 8.5 mg/dL — ABNORMAL LOW (ref 8.9–10.3)
Chloride: 92 mmol/L — ABNORMAL LOW (ref 98–111)
Creatinine, Ser: 1.18 mg/dL — ABNORMAL HIGH (ref 0.44–1.00)
GFR, Estimated: 51 mL/min — ABNORMAL LOW (ref >60)
Glucose, Bld: 109 mg/dL — ABNORMAL HIGH (ref 70–99)
Potassium: 3.7 mmol/L (ref 3.5–5.1)
Sodium: 131 mmol/L — ABNORMAL LOW (ref 135–145)

## 2021-11-16 LAB — CBC
HCT: 26.7 % — ABNORMAL LOW (ref 36.0–46.0)
Hemoglobin: 8.5 g/dL — ABNORMAL LOW (ref 12.0–15.0)
MCH: 30.4 pg (ref 26.0–34.0)
MCHC: 31.8 g/dL (ref 30.0–36.0)
MCV: 95.4 fL (ref 80.0–100.0)
Platelets: 139 10*3/uL — ABNORMAL LOW (ref 150–400)
RBC: 2.8 MIL/uL — ABNORMAL LOW (ref 3.87–5.11)
RDW: 13.6 % (ref 11.5–15.5)
WBC: 17.4 10*3/uL — ABNORMAL HIGH (ref 4.0–10.5)
nRBC: 0 % (ref 0.0–0.2)

## 2021-11-16 LAB — APTT
aPTT: 82 seconds — ABNORMAL HIGH (ref 24–36)
aPTT: 72 seconds — ABNORMAL HIGH (ref 24–36)

## 2021-11-16 LAB — MAGNESIUM: Magnesium: 2.8 mg/dL — ABNORMAL HIGH (ref 1.7–2.4)

## 2021-11-16 LAB — HEPARIN LEVEL (UNFRACTIONATED): Heparin Unfractionated: >1.1 IU/mL — ABNORMAL HIGH (ref 0.30–0.70)

## 2021-11-16 MED ORDER — HYDROMORPHONE HCL 2 MG PO TABS
4.0000 mg | ORAL_TABLET | ORAL | Status: DC | PRN
  Administered 2021-11-16 – 2021-11-17 (×4): 4 mg via ORAL
  Filled 2021-11-16 (×4): qty 2

## 2021-11-16 MED ORDER — SODIUM CHLORIDE 0.9 % IV BOLUS
250.0000 mL | Freq: Once | INTRAVENOUS | Status: AC
  Administered 2021-11-16: 15:00:00 250 mL via INTRAVENOUS

## 2021-11-16 MED ORDER — LACTULOSE 10 GM/15ML PO SOLN
20.0000 g | Freq: Two times a day (BID) | ORAL | Status: DC
  Administered 2021-11-16 – 2021-11-17 (×3): 20 g via ORAL
  Filled 2021-11-16 (×3): qty 30

## 2021-11-16 MED ORDER — OXYCODONE HCL ER 15 MG PO T12A
15.0000 mg | EXTENDED_RELEASE_TABLET | Freq: Three times a day (TID) | ORAL | Status: DC
  Administered 2021-11-16 – 2021-11-17 (×4): 15 mg via ORAL
  Filled 2021-11-16 (×5): qty 1

## 2021-11-16 MED ORDER — ENOXAPARIN SODIUM 80 MG/0.8ML IJ SOSY
1.0000 mg/kg | PREFILLED_SYRINGE | Freq: Two times a day (BID) | INTRAMUSCULAR | Status: DC
  Administered 2021-11-16 – 2021-11-17 (×3): 62.5 mg via SUBCUTANEOUS
  Filled 2021-11-16 (×4): qty 0.63

## 2021-11-16 MED ORDER — SODIUM CHLORIDE 0.9 % IV BOLUS
500.0000 mL | Freq: Once | INTRAVENOUS | Status: AC
Start: 2021-11-16 — End: 2021-11-16
  Administered 2021-11-16: 500 mL via INTRAVENOUS

## 2021-11-16 MED ORDER — SODIUM CHLORIDE 0.9 % IV SOLN: INTRAVENOUS | Status: DC

## 2021-11-16 NOTE — Progress Notes (Signed)
Cross Cover Patient complaining of 10/10 generalized legs,  back, abdomen.  borderline low. Am labs with slight decrease sodium, increase creatinine even after small NS bolus given for pressure Starting maintenance IV fluids at 100. Oxycodone previously ordered for pain

## 2021-11-16 NOTE — Consult Note (Signed)
ANTICOAGULATION CONSULT NOTE   Pharmacy Consult for heparin Indication: h/o DVT >> presenting w/ pulmonary embolus and NEW embolic stroke  Allergies  Allergen Reactions   Sulfa Antibiotics Nausea Only and Nausea And Vomiting    Patient Measurements: Height: 5' 5.51" (166.4 cm) Weight: 56 kg (123 lb 7.3 oz) IBW/kg (Calculated) : 58.18 Heparin Dosing weight: 55.5 kg  Vital Signs: Temp: 98.2 F (36.8 C) (02/11 0028) Temp Source: Oral (02/11 0028) BP: 90/54 (02/11 0028) Pulse Rate: 87 (02/11 0028)  Labs: Recent Labs    11/13/21 0609 11/14/21 0510 11/14/21 2004 11/15/21 0317 11/15/21 0942 11/16/21 0057  HGB 10.1* 9.6*  --  9.2*  --   --   HCT 32.1* 29.7*  --  28.0*  --   --   PLT 120* 93*  --  127*  --   --   APTT  --   --    < > 94* 96* 82*  HEPARINUNFRC  --   --   --   --   --  >1.10*  CREATININE 1.26* 1.10*  --  1.26*  --   --    < > = values in this interval not displayed.     Estimated Creatinine Clearance: 38.8 mL/min (A) (by C-G formula based on SCr of 1.26 mg/dL (H)).   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   Acute pulmonary embolism ([place]) 11/06/2021   Bipolar 2 disorder (HCC)    Body mass index (BMI) 19.9 or less, adult 11/09/2021   CVA (cerebral vascular accident) ([place]) 11/09/2021   DVT of leg (deep venous thrombosis) ([place]) 11/09/2021   Dysplastic nevus    R calf, txted in past, pt states ~2002   Fibromyalgia    Psoriasis    Thyroid nodule 11/09/2021    Medications:  Apixaban 5mg  BID (for DVT lower extremity) >> xarelto >> heparin >> argatroban >> heparin  Assessment: patient is a 66 year old female recently diagnosed with a DVT about 3 weeks ago who is on Eliquis PTA now presents with some left upper abdominal pain and back pain for several weeks.  On 2/7, CT positive for pulmonary emboli.   On 2/8 PM, patient with new embolic stroke. MRI brain: "Compared to  the recent prior MRI, there are multiple new small foci of cortical and subcortical restricted diffusion in both cerebral hemispheres primarily in the frontal lobes. Some of these have an appearance most suggestive of subacute embolic infarcts"  On 2/9: Per d/w Heme/Onc and neuro will transition heparin to argatroban during HITT w/u.  Patient's 4T score of 5-6 points Intermediate/High risk (PLT >50% drop & Nadir >20 -- 2; Fall in 5-10d -- 2; progressive DVT>PE -- 1; TCP alternative causes likely/definite -- 1).  2/10: HIT panel negative, per d/w Heme/Onc will transition back to heparin drip.   Goal of Therapy:  Heparin level 0.3-0.7 units/ml Monitor platelets by anticoagulation protocol: Yes aPTT goal 66-102  Date Time HL/aPTT Rate/comment 2/09  2004  aPTT 126 supratherapeutic 2/10  0317  aPTT 94 supratherapeutic 2/10  0942  aPTT 96 Supratherapeutic 2/11 0057 aPTT 82, therapeutic  HL > 1.10   Plan:  Start heparin infusion at 900 units/hr with no bolus Recheck aPTT in 6 hours to confirm. If levels correlate may continue to monitor by heparin level only.  HL and CBC daily while on heparin.  Renda Rolls, PharmD, MBA 11/16/2021 2:25 AM

## 2021-11-16 NOTE — Progress Notes (Signed)
Cross Cover Repeat head CT w/o contrast ordered for reports of change in NIH with new BLE weakness, ataxia and extinction CT results IMPRESSION: 1. No acute intracranial pathology. 2. Faint small white matter hypodensities in the left frontal convexity and right cerebellum corresponds to the findings of the MRI. These were better seen on the MRI exam BP relatively low and may account for her neuro changes. Creatinine prior increast is notable Fluid  bolus odrered

## 2021-11-16 NOTE — Progress Notes (Signed)
PT Cancellation Note  Patient Details Name: Cheryl Schroeder MRN: [id number] DOB: [birth year]   Cancelled Treatment:    Reason Eval/Treat Not Completed: Other (comment). Per MD, plan is for patient to transition home with hospice, PT to sign off.   Lieutenant Diego PT, DPT 1:17 PM,11/16/21

## 2021-11-16 NOTE — Progress Notes (Addendum)
PROGRESS NOTE    Cheryl Schroeder  OZH:[id number] DOB: 1956/02/24 DOA: 11/06/2021 PCP: Lynnell Jude, MD    Brief Narrative:  66 year old woman PMH recent outpatient diagnosis DVT treated with apixaban, presented with chest pain and frequent falls.  CT revealed acute saddle embolism and multiple PE as well as right middle lung mass and skeletal lesions concerning for malignancy.  Seen by pulmonology with recommendation for malignancy work-up. S/p right-sided thoracentesis. Seen by vascular surgery and underwent left pulmonary mechanical thrombectomy as well as placement of IVC.  Brain MRI concerning for metastatic disease as well as subacute infarcts.  Neurology consulted.  Awaiting cytology from thoracentesis.  Given need for anticoagulation, obtained liver biopsy prior to transition to oral anticoagulation.   Patient had a new onset of right arm weakness on 2/8, MRI of the brain showed new embolic strokes.  Heparin drip started, but due to significant drop in platelets, anticoagulation changed to argatroban 2/9.  2/10.  HIT antibody negative, anticoagulation changed to heparin.   Assessment & Plan:   Principal Problem:   Acute pulmonary embolism (HCC) Active Problems:   Malignant pleural effusion   Mass of right lung   Bone lesion   Liver lesion   Demand ischemia (HCC)   Acquired thrombophilia (HCC)   DVT of leg (deep venous thrombosis) (HCC)   Thyroid nodule   Renal lesion   Brain mass   CVA (cerebral vascular accident) ([place])   Body mass index (BMI) 19.9 or less, adult   Liver hematoma   Liver metastases (HCC)   Pleural effusion   Orthostatic hypotension   Palliative care encounter   Drug-induced constipation   Primary malignant neoplasm of lung metastatic to other site [facility])   New embolic stroke. Patient HIT antibody was negative, continue anticoagulation with Lovenox.   Thrombocytopenia. Worsening leukocytosis. Anemia.  HIT antibody came back negative, discontinued  Ditropan, restart anticoagulation with Lovenox.     Right lung mass. 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 Thyroid nodule. Discussed with the patient and family, they have not made a decision against any future chemotherapy.  They have discussed with oncology about it.  They prefer transition to hospice care at home.  Prognosis is probably less than a months.  Discussed with the social worker, will arrange home hospice.     Orthostatic hypotension. Acute kidney injury. Hyperkalemia secondary to acute kidney injury. Hyponatremia. Patient had an episode of confusion, blood pressure was soft last night, she was started IV fluid again.  Currently she feels better, mental status has improved.  I will discontinue IV fluids.  Liver hematoma secondary to liver biopsy. Repeated right upper quadrant ultrasound did not show any hematoma, contusion has improved.   Acute pulm embolism. DVT. Acquired thrombophilia. Demand ischemia with elevated troponin secondary to PE. Was started on heparin drip yesterday, will change to Lovenox today.  Malignant pleural effusion. S/p thoracentesis.   Urinary retention. Patient probably will keep Foley catheter at time of discharge.  Continue Flomax for now.  Constipation. No bowel movement for 3 days, currently on scheduled MiraLAX, will add lactulose 20 g twice a day for now.     DVT prophylaxis: Lovenox. Code Status: DNR Family Communication: Sister updated Disposition Plan:      Status is: Inpatient   Remains inpatient appropriate because: Severity of disease     I/O last 3 completed shifts: In: 39.5 [I.V.:39.5] Out: 1000 [Urine:1000] Total I/O In: 120 [P.O.:120] Out: -  Consultants:  Oncology.  Procedures: Liver biopsy.  Antimicrobials: None  Subjective: Had a secondary weakness last night, blood pressure was soft, she was given IV fluids.  She feels much better this morning, she  is eating food and drinking water.  No abdominal pain or nausea vomiting. No short of breath or cough.  Objective: Vitals:   11/16/21 0459 11/16/21 0500 11/16/21 0610 11/16/21 0828  BP: (!) 90/51  (!) 96/50 (!) 94/52  Pulse: 99   89  Resp: 16   18  Temp: 98.8 F (37.1 C)   99.3 F (37.4 C)  TempSrc: Oral   Oral  SpO2: 93%   93%  Weight:  62.5 kg    Height:        Intake/Output Summary (Last 24 hours) at 11/16/2021 1217 Last data filed at 11/16/2021 0900 Gross per 24 hour  Intake 159.5 ml  Output 500 ml  Net -340.5 ml   Filed Weights   11/09/21 0500 11/11/21 0458 11/16/21 0500  Weight: 54.6 kg 56 kg 62.5 kg    Examination:  General exam: Appears calm and comfortable  Respiratory system: Clear to auscultation. Respiratory effort normal. Cardiovascular system: S1 & S2 heard, RRR. No JVD, murmurs, rubs, gallops or clicks. No pedal edema. Gastrointestinal system: Abdomen is nondistended, soft and nontender. No organomegaly or masses felt. Normal bowel sounds heard. Central nervous system: Alert and oriented x3. No focal neurological deficits. Extremities: Symmetric 5 x 5 power. Skin: No rashes, lesions or ulcers Psychiatry: Judgement and insight appear normal. Mood & affect appropriate.     Data Reviewed:   CBC: Recent Labs  Lab 11/12/21 0552 11/13/21 0609 11/14/21 0510 11/15/21 0317 11/16/21 0646  WBC 15.1* 14.6* 20.1* 21.0* 17.4*  HGB 10.4* 10.1* 9.6* 9.2* 8.5*  HCT 33.2* 32.1* 29.7* 28.0* 26.7*  MCV 94.1 93.0 92.0 92.1 95.4  PLT 143* 120* 93* 127* 875*   Basic Metabolic Panel: Recent Labs  Lab 11/11/21 0551 11/13/21 0609 11/13/21 1221 11/14/21 0510 11/15/21 0317 11/16/21 0646  NA 140 136  --  132* 130* 131*  K 4.1 5.3* 4.4 4.3 4.0 3.7  CL 107 101  --  96* 95* 92*  CO2 30 26  --  25 26 27   GLUCOSE 103* 115*  --  138* 133* 109*  BUN 19 37*  --  35* 39* 36*  CREATININE 0.82 1.26*  --  1.10* 1.26* 1.18*  CALCIUM 9.0 9.6  --  9.0 8.5* 8.5*  MG  --   2.4  --  2.4 2.5* 2.8*   GFR: Estimated Creatinine Clearance: 43.1 mL/min (A) (by C-G formula based on SCr of 1.18 mg/dL (H)). Liver Function Tests: No results for input(s): AST, ALT, ALKPHOS, BILITOT, PROT, ALBUMIN in the last 168 hours. No results for input(s): LIPASE, AMYLASE in the last 168 hours. No results for input(s): AMMONIA in the last 168 hours. Coagulation Profile: No results for input(s): INR, PROTIME in the last 168 hours. Cardiac Enzymes: No results for input(s): CKTOTAL, CKMB, CKMBINDEX, TROPONINI in the last 168 hours. BNP (last 3 results) No results for input(s): PROBNP in the last 8760 hours. HbA1C: No results for input(s): HGBA1C in the last 72 hours. CBG: No results for input(s): GLUCAP in the last 168 hours. Lipid Profile: No results for input(s): CHOL, HDL, LDLCALC, TRIG, CHOLHDL, LDLDIRECT in the last 72 hours. Thyroid Function Tests: No results for input(s): TSH, T4TOTAL, FREET4, T3FREE, THYROIDAB in the last 72 hours. Anemia Panel: Recent Labs    11/14/21  0902  VITAMINB12 913  FOLATE 15.7   Sepsis Labs: No results for input(s): PROCALCITON, LATICACIDVEN in the last 168 hours.  Recent Results (from the past 240 hour(s))  Resp Panel by RT-PCR (Flu A&B, Covid) Nasopharyngeal Swab     Status: None   Collection Time: 11/06/21  3:49 PM   Specimen: Nasopharyngeal Swab; Nasopharyngeal(NP) swabs in vial transport medium  Result Value Ref Range Status   SARS Coronavirus 2 by RT PCR NEGATIVE NEGATIVE Final    Comment: (NOTE) SARS-CoV-2 target nucleic acids are NOT DETECTED.  The SARS-CoV-2 RNA is generally detectable in upper respiratory specimens during the acute phase of infection. The lowest concentration of SARS-CoV-2 viral copies this assay can detect is 138 copies/mL. A negative result does not preclude SARS-Cov-2 infection and should not be used as the sole basis for treatment or other patient management decisions. A negative result may occur with   improper specimen collection/handling, submission of specimen other than nasopharyngeal swab, presence of viral mutation(s) within the areas targeted by this assay, and inadequate number of viral copies(<138 copies/mL). A negative result must be combined with clinical observations, patient history, and epidemiological information. The expected result is Negative.  Fact Sheet for Patients:  EntrepreneurPulse.com.au  Fact Sheet for Healthcare Providers:  IncredibleEmployment.be  This test is no t yet approved or cleared by the [place] FDA and  has been authorized for detection and/or diagnosis of SARS-CoV-2 by FDA under an Emergency Use Authorization (EUA). This EUA will remain  in effect (meaning this test can be used) for the duration of the COVID-19 declaration under Section 564(b)(1) of the Act, 21 U.S.C.section 360bbb-3(b)(1), unless the authorization is terminated  or revoked sooner.       Influenza A by PCR NEGATIVE NEGATIVE Final   Influenza B by PCR NEGATIVE NEGATIVE Final    Comment: (NOTE) The Xpert Xpress SARS-CoV-2/FLU/RSV plus assay is intended as an aid in the diagnosis of influenza from Nasopharyngeal swab specimens and should not be used as a sole basis for treatment. Nasal washings and aspirates are unacceptable for Xpert Xpress SARS-CoV-2/FLU/RSV testing.  Fact Sheet for Patients: EntrepreneurPulse.com.au  Fact Sheet for Healthcare Providers: IncredibleEmployment.be  This test is not yet approved or cleared by the [place] FDA and has been authorized for detection and/or diagnosis of SARS-CoV-2 by FDA under an Emergency Use Authorization (EUA). This EUA will remain in effect (meaning this test can be used) for the duration of the COVID-19 declaration under Section 564(b)(1) of the Act, 21 U.S.C. section 360bbb-3(b)(1), unless the authorization is terminated  or revoked.  Performed at [facility], [address] [place], [place] [address]   CULTURE, BLOOD (ROUTINE X 2) w Reflex to ID Panel     Status: None (Preliminary result)   Collection Time: 11/07/21  4:37 PM   Specimen: BLOOD  Result Value Ref Range Status   Specimen Description BLOOD LEFT ANTECUBITAL  Final   Special Requests   Final    BOTTLES DRAWN AEROBIC AND ANAEROBIC Blood Culture adequate volume   Culture   Final    NO GROWTH 4 DAYS Performed at [facility], [address] [place], [place] [address]    Report Status PENDING  Incomplete  CULTURE, BLOOD (ROUTINE X 2) w Reflex to ID Panel     Status: None (Preliminary result)   Collection Time: 11/07/21  4:38 PM   Specimen: BLOOD  Result Value Ref Range Status   Specimen Description BLOOD BLOOD LEFT ARM  Final   Special Requests   Final    BOTTLES DRAWN AEROBIC AND ANAEROBIC Blood Culture adequate volume   Culture   Final    NO GROWTH 4 DAYS Performed at [facility], [address] [place], [place] [address]    Report Status PENDING  Incomplete         Radiology Studies: CT HEAD WO CONTRAST (5MM)  Result Date: 11/15/2021 CLINICAL DATA:  Followup embolic stroke.  Decline in mental status. EXAM: CT HEAD WITHOUT CONTRAST TECHNIQUE: Contiguous axial images were obtained from the base of the skull through the vertex without intravenous contrast. RADIATION DOSE REDUCTION: This exam was performed according to the departmental dose-optimization program which includes automated exposure control, adjustment of the mA and/or kV according to patient size and/or use of iterative reconstruction technique. COMPARISON:  CT dated 11/09/2021.  MRI dated 11/13/2021 FINDINGS: Brain: The ventricles and sulci appropriate size for patient's age. Faint small white matter hypodensities in the left frontal convexity (18/2) as well as additional hypodense focus in the right cerebellum 6 corresponds to the findings  of the MRI. These were better seen on the MRI exam. No acute intracranial hemorrhage. No mass effect or midline shift. No extra-axial fluid collection. Vascular: No hyperdense vessel or unexpected calcification. Skull: Normal. Negative for fracture or focal lesion. Sinuses/Orbits: No acute finding. Other: None IMPRESSION: 1. No acute intracranial pathology. 2. Faint small white matter hypodensities in the left frontal convexity and right cerebellum corresponds to the findings of the MRI. These were better seen on the MRI exam. Electronically Signed   By: Anner Crete M.D.   On: 11/15/2021 23:08        Scheduled Meds:  (feeding supplement) PROSource Plus  30 mL Oral BID BM   Chlorhexidine Gluconate Cloth  6 each Topical Daily   docusate sodium  100 mg Oral BID   enoxaparin (LOVENOX) injection  1 mg/kg Subcutaneous Q12H   FLUoxetine  40 mg Oral Daily   fluticasone  1 spray Each Nare Daily   gabapentin  300 mg Oral TID   lactulose  20 g Oral BID   lamoTRIgine  200 mg Oral Daily   multivitamin with minerals  1 tablet Oral Daily   pantoprazole  40 mg Oral Daily   polyethylene glycol  17 g Oral Daily   pravastatin  40 mg Oral QHS   senna-docusate  2 tablet Oral BID   sodium chloride flush  3 mL Intravenous Q12H   sodium chloride  1 g Oral BID WC   tamsulosin  0.4 mg Oral QPC supper   Continuous Infusions:  sodium chloride       LOS: 10 days    Time spent: 50 minutes, more than 50% time involved in direct patient care.    Sharen Hones, MD [facility]   To contact the attending provider between 7A-7P or the covering provider during after hours 7P-7A, please log into the web site [email or web address] and access using universal [place] password for that web site. If you do not have the password, please call the hospital operator.  11/16/2021, 12:17 PM

## 2021-11-16 NOTE — Consult Note (Addendum)
ANTICOAGULATION CONSULT NOTE   Pharmacy Consult for enoxaparin Indication: h/o DVT >> presenting w/ pulmonary embolus and NEW embolic stroke  Allergies  Allergen Reactions   Sulfa Antibiotics Nausea Only and Nausea And Vomiting    Patient Measurements: Height: 5' 5.51" (166.4 cm) Weight: 62.5 kg (137 lb 12.6 oz) IBW/kg (Calculated) : 58.18 Heparin Dosing weight: 55.5 kg  Vital Signs: Temp: 98.8 F (37.1 C) (02/11 0459) Temp Source: Oral (02/11 0459) BP: 96/50 (02/11 0610) Pulse Rate: 99 (02/11 0459)  Labs: Recent Labs    11/14/21 0510 11/14/21 2004 11/15/21 0317 11/15/21 0942 11/16/21 0057 11/16/21 0646  HGB 9.6*  --  9.2*  --   --  8.5*  HCT 29.7*  --  28.0*  --   --  26.7*  PLT 93*  --  127*  --   --  139*  APTT  --    < > 94* 96* 82* 72*  HEPARINUNFRC  --   --   --   --  >1.10*  --   CREATININE 1.10*  --  1.26*  --   --  1.18*   < > = values in this interval not displayed.     Estimated Creatinine Clearance: 43.1 mL/min (A) (by C-G formula based on SCr of 1.18 mg/dL (H)).   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   Acute pulmonary embolism ([place]) 11/06/2021   Bipolar 2 disorder ([facility])    Body mass index (BMI) 19.9 or less, adult 11/09/2021   CVA (cerebral vascular accident) ([place]) 11/09/2021   DVT of leg (deep venous thrombosis) ([place]) 11/09/2021   Dysplastic nevus    R calf, txted in past, pt states ~2002   Fibromyalgia    Psoriasis    Thyroid nodule 11/09/2021    Medications:  Apixaban 5mg  BID (for DVT lower extremity) >> xarelto >> heparin >> argatroban >> heparin  Assessment: patient is a 66 year old female recently diagnosed with a DVT about 3 weeks ago who is on Eliquis PTA now presents with some left upper abdominal pain and back pain for several weeks.  On 2/7, CT positive for pulmonary emboli.   On 2/8 PM, patient with new embolic stroke. MRI brain: "Compared  to the recent prior MRI, there are multiple new small foci of cortical and subcortical restricted diffusion in both cerebral hemispheres primarily in the frontal lobes. Some of these have an appearance most suggestive of subacute embolic infarcts"  On 2/9: Per d/w Heme/Onc and neuro will transition heparin to argatroban during HITT w/u.  Patient's 4T score of 5-6 points Intermediate/High risk (PLT >50% drop & Nadir >20 -- 2; Fall in 5-10d -- 2; progressive DVT>PE -- 1; TCP alternative causes likely/definite -- 1).  2/10: HIT panel negative, per d/w Heme/Onc will transition back to heparin drip.   Goal of Therapy:  Monitor platelets by anticoagulation protocol: Yes   Date Time HL/aPTT Rate/comment 2/09  2004  aPTT 126 supratherapeutic 2/10  0317  aPTT 94 supratherapeutic 2/10  0942  aPTT 96 Supratherapeutic 2/11 0057 aPTT 82, therapeutic, HL > 1.10 2/11 0646 aPTT 72 Therapeutic x 1  2/11: per MD, will transition patient from heparin to enoxaparin    Plan:  Enoxaparin 62.5mg  BID(1 mg/kg dosing) HL and CBC daily while on heparin.  Pearla Dubonnet, PharmD Clinical Pharmacist 11/16/2021 8:04 AM

## 2021-11-17 LAB — BASIC METABOLIC PANEL
Anion gap: 9 (ref 5–15)
BUN: 28 mg/dL — ABNORMAL HIGH (ref 8–23)
CO2: 27 mmol/L (ref 22–32)
Calcium: 8.6 mg/dL — ABNORMAL LOW (ref 8.9–10.3)
Chloride: 95 mmol/L — ABNORMAL LOW (ref 98–111)
Creatinine, Ser: 0.99 mg/dL (ref 0.44–1.00)
GFR, Estimated: >60 mL/min (ref >60)
Glucose, Bld: 119 mg/dL — ABNORMAL HIGH (ref 70–99)
Potassium: 4.1 mmol/L (ref 3.5–5.1)
Sodium: 131 mmol/L — ABNORMAL LOW (ref 135–145)

## 2021-11-17 LAB — MAGNESIUM: Magnesium: 2.9 mg/dL — ABNORMAL HIGH (ref 1.7–2.4)

## 2021-11-17 LAB — CBC
HCT: 24.9 % — ABNORMAL LOW (ref 36.0–46.0)
Hemoglobin: 7.8 g/dL — ABNORMAL LOW (ref 12.0–15.0)
MCH: 29.1 pg (ref 26.0–34.0)
MCHC: 31.3 g/dL (ref 30.0–36.0)
MCV: 92.9 fL (ref 80.0–100.0)
Platelets: 211 10*3/uL (ref 150–400)
RBC: 2.68 MIL/uL — ABNORMAL LOW (ref 3.87–5.11)
RDW: 13.5 % (ref 11.5–15.5)
WBC: 14.3 10*3/uL — ABNORMAL HIGH (ref 4.0–10.5)
nRBC: 0 % (ref 0.0–0.2)

## 2021-11-17 MED ORDER — HYDROMORPHONE HCL 4 MG PO TABS: 4.0000 mg | ORAL_TABLET | ORAL | 0 refills | Status: AC | PRN

## 2021-11-17 MED ORDER — OXYCODONE HCL ER 15 MG PO T12A: 15.0000 mg | EXTENDED_RELEASE_TABLET | Freq: Three times a day (TID) | ORAL | 0 refills | Status: AC

## 2021-11-17 MED ORDER — LUBIPROSTONE 24 MCG PO CAPS: 24.0000 ug | ORAL_CAPSULE | Freq: Two times a day (BID) | ORAL | Status: DC

## 2021-11-17 MED ORDER — LUBIPROSTONE 24 MCG PO CAPS
24.0000 ug | ORAL_CAPSULE | Freq: Two times a day (BID) | ORAL | Status: DC
  Administered 2021-11-17: 13:00:00 24 ug via ORAL
  Filled 2021-11-17 (×3): qty 1

## 2021-11-17 MED ORDER — LINACLOTIDE 145 MCG PO CAPS
290.0000 ug | ORAL_CAPSULE | Freq: Every day | ORAL | Status: DC
  Filled 2021-11-17: qty 1

## 2021-11-17 NOTE — Discharge Summary (Signed)
Physician Discharge Summary   Patient: Cheryl Schroeder MRN: [id number] DOB: March 15, 1956  Admit date:     11/06/2021  Discharge date: 11/17/21  Discharge Physician: Sharen Hones   PCP: Lynnell Jude, MD    Discharge Diagnoses: Principal Problem:   Acute pulmonary embolism [facility]) Active Problems:   Malignant pleural effusion   Mass of right lung   Bone lesion   Liver lesion   Demand ischemia ([place])   Acquired thrombophilia ([place])   DVT of leg (deep venous thrombosis) ([facility])   Thyroid nodule   Renal lesion   Brain mass   CVA (cerebral vascular accident) ([place])   Body mass index (BMI) 19.9 or less, adult   Liver hematoma   Liver metastases ([facility])   Pleural effusion   Orthostatic hypotension   Palliative care encounter   Drug-induced constipation   Primary malignant neoplasm of lung metastatic to other site ([place])   Non-small cell lung cancer ([place])  Resolved Problems:   * No resolved hospital problems. *   Hospital Course: 66 year old woman PMH recent outpatient diagnosis DVT treated with apixaban, presented with chest pain and frequent falls.  CT revealed acute saddle embolism and multiple PE as well as right middle lung mass and skeletal lesions concerning for malignancy.  Seen by pulmonology with recommendation for malignancy work-up. S/p right-sided thoracentesis. Seen by vascular surgery and underwent left pulmonary mechanical thrombectomy as well as placement of IVC.  Brain MRI concerning for metastatic disease as well as subacute infarcts.  Neurology consulted.  Awaiting cytology from thoracentesis.  Given need for anticoagulation, obtained liver biopsy prior to transition to oral anticoagulation.   Patient had a new onset of right arm weakness on 2/8, MRI of the brain showed new embolic strokes.  Heparin drip started, but due to significant drop in platelets, anticoagulation changed to argatroban 2/9.   2/10.  HIT antibody negative, anticoagulation changed to  heparin.  Assessment and Plan: Opioid-induced constipation. Patient was given multiple stool softener laxatives, still no bowel movement.  We will give Amitiza.     New embolic stroke. Patient is transferred to inpatient hospice, will discontinue all treatment.   Thrombocytopenia. Worsening leukocytosis. Anemia.  Condition is more stable.     Right lung mass. 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 Thyroid nodule. Patient has a poor prognosis.  Family now request inpatient hospice.     Orthostatic hypotension. Acute kidney injury. Hyperkalemia secondary to acute kidney injury. Hyponatremia. No additional treatment for above.   Liver hematoma secondary to liver biopsy. Repeated right upper quadrant ultrasound did not show any hematoma, contusion has improved.   Acute pulm embolism. DVT. Acquired thrombophilia. Demand ischemia with elevated troponin secondary to PE. No additional treatment  Malignant pleural effusion. S/p thoracentesis.   Urinary retention. Continue Foley catheter, given patient terminal condition, will discontinue Flomax.     Patient condition has been deteriorating, after further discussion with family and hospice, family opted for inpatient hospice.  Will be transferred today.        Consultants: Oncology, neurology. Procedures performed: Liver biopsy Disposition: Hospice care Diet recommendation:  Discharge Diet Orders (From admission, onward)     Start     Ordered   11/17/21 0000  Diet - low sodium heart healthy        11/17/21 1514           Regular diet  DISCHARGE MEDICATION: Allergies as of 11/17/2021  Reactions   Sulfa Antibiotics Nausea Only, Nausea And Vomiting        Medication List     STOP taking these medications    acetaminophen 500 MG tablet Commonly known as: TYLENOL   ALPRAZolam 0.5 MG tablet Commonly known as: XANAX   amoxicillin-clavulanate 875-125  MG tablet Commonly known as: AUGMENTIN   CALCIUM + D PO   Eliquis 5 MG Tabs tablet Generic drug: apixaban   Fluocinolone Acetonide Body 0.01 % Oil   FLUoxetine 40 MG capsule Commonly known as: PROZAC   fluticasone 50 MCG/ACT nasal spray Commonly known as: FLONASE   gabapentin 300 MG capsule Commonly known as: NEURONTIN   HYDROcodone-acetaminophen 5-325 MG tablet Commonly known as: NORCO/VICODIN   lamoTRIgine 200 MG tablet Commonly known as: LAMICTAL   omeprazole 40 MG capsule Commonly known as: PRILOSEC   pravastatin 40 MG tablet Commonly known as: PRAVACHOL       TAKE these medications    HYDROmorphone 4 MG tablet Commonly known as: DILAUDID Take 1 tablet (4 mg total) by mouth every 4 (four) hours as needed for severe pain.   oxyCODONE 15 mg 12 hr tablet Commonly known as: OXYCONTIN Take 1 tablet (15 mg total) by mouth every 8 (eight) hours.               Discharge Care Instructions  (From admission, onward)           Start     Ordered   11/17/21 0000  Discharge wound care:       Comments: RN follow   11/17/21 1514            Follow-up Information     Lynnell Jude, MD In 2 days.   Specialty: Family Medicine Contact information: [place] [id number] [phone]                 Discharge Exam: Filed Weights   11/09/21 0500 11/11/21 0458 11/16/21 0500  Weight: 54.6 kg 56 kg 62.5 kg   General exam: Appears calm and comfortable  Respiratory system: Clear to auscultation. Respiratory effort normal. Cardiovascular system: S1 & S2 heard, RRR. No JVD, murmurs, rubs, gallops or clicks. No pedal edema. Gastrointestinal system: Abdomen is nondistended, soft and left abdominal tender. No organomegaly or masses felt. Normal bowel sounds heard. Central nervous system: Alert and oriented x2. No focal neurological deficits. Extremities: Symmetric 5 x 5 power. Skin: No rashes, lesions or ulcers Psychiatry: Judgement  and insight appear normal. Mood & affect appropriate.    Condition at discharge: poor  The results of significant diagnostics from this hospitalization (including imaging, microbiology, ancillary and laboratory) are listed below for reference.   Imaging Studies: CT ANGIO HEAD NECK W WO CM  Result Date: 11/09/2021 CLINICAL DATA:  Stroke suspected EXAM: CT ANGIOGRAPHY HEAD AND NECK TECHNIQUE: Multidetector CT imaging of the head and neck was performed using the standard protocol during bolus administration of intravenous contrast. Multiplanar CT image reconstructions and MIPs were obtained to evaluate the vascular anatomy. Carotid stenosis measurements (when applicable) are obtained utilizing NASCET criteria, using the distal internal carotid diameter as the denominator. RADIATION DOSE REDUCTION: This exam was performed according to the departmental dose-optimization program which includes automated exposure control, adjustment of the mA and/or kV according to patient size and/or use of iterative reconstruction technique. CONTRAST:  36mL OMNIPAQUE IOHEXOL 350 MG/ML SOLN COMPARISON:  No prior CT or CTA clinical correlation is made with MRI 11/09/2021; FINDINGS: CT HEAD FINDINGS  Brain: No evidence of acute cortical infarction, hemorrhage, cerebral edema, mass, mass effect, or midline shift. No hydrocephalus or extra-axial fluid collection. Vascular: No hyperdense vessel. Skull: Normal. Negative for fracture or focal lesion. Sinuses/Orbits: No acute finding. Other: Fluid in the right mastoid air cells. CTA NECK FINDINGS Aortic arch: Standard branching. Imaged portion shows no evidence of aneurysm or dissection. No significant stenosis of the major arch vessel origins. Right carotid system: No evidence of dissection, stenosis (50% or greater) or occlusion. Left carotid system: No evidence of dissection, stenosis (50% or greater) or occlusion. Vertebral arteries: The left vertebral artery is patent from its origin  to the skull base. The right vertebral artery is diminutive, likely congenitally given the size of the vertebral artery foramen, and is patent to the skull base. No evidence of dissection, occlusion, or hemodynamically significant stenosis (greater than 50%). Skeleton: Numerous lytic lesions in the cervical and imaged thoracic spine. Possible mild vertebral body height loss at C6. Other neck: Hypoenhancing lesion in the left thyroid lobe measures up to 2.2 cm. Upper chest: Redemonstrated saddle embolus in the left main pulmonary artery, extending into the branch vessels, which was better evaluated on the 11/06/2021 CTA. Slight decrease in the size of a right pleural effusion, with new trace left pleural effusion. New ground-glass and patchy opacities in the right upper lobe (series 9, images 234, 237, 275, and 305) and superior right lower lobe (series 9, image 310), with redemonstrated opacities in the left upper lobe (series 9, image 232) and medial right upper lobe (series 9, image 295). Partially imaged inferior right upper lobe mass. Review of the MIP images confirms the above findings CTA HEAD FINDINGS Anterior circulation: Both internal carotid arteries are patent to the termini, without significant stenosis. A1 segments patent. Normal anterior communicating artery. Anterior cerebral arteries are patent to their distal aspects. No M1 stenosis or occlusion. Normal MCA bifurcations. Distal MCA branches perfused and symmetric. Posterior circulation: The right vertebral artery is quite diminutive in the proximal V4 segment and further decreased in caliber after the takeoff of the PICA (series 9, image 129); while this may represent a degree of stenosis, it could also be due to normal branching of an already diminutive vessel. The dominant left vertebral artery is patent to the vertebrobasilar junction. Posterior inferior cerebral arteries patent bilaterally. Basilar patent to its distal aspect. Superior  cerebellar arteries patent bilaterally. Patent right posterior communicating artery, with a hypoplastic right P1 and near fetal origin of the right PCA. The left P1 is patent, with a small patent left posterior communicating artery. PCAs perfused to their distal aspects without stenosis. Venous sinuses: As permitted by contrast timing, patent. Anatomic variants: Near fetal origin of the right PCA. Review of the MIP images confirms the above findings IMPRESSION: 1. Diminutive extracranial right vertebral artery, likely congenital, with further diminution of the intracranial right V4 after the takeoff of the right PICA, which may reflect a degree of stenosis and/or normal post-branching caliber of an already diminutive vessel. The left vertebral artery is dominant and patent. No intracranial large vessel occlusion or other hemodynamically significant stenosis. 2.  No hemodynamically significant stenosis in the neck. 3. Partially imaged right mid lung mass, with increased ground-glass opacities in the imaged right upper and lower lobes, concerning for worsening multifocal infection. 4. Slightly decreased right pleural effusion, with new trace left pleural effusion. 5. Lytic lesions in the cervical spine, with possible mild vertebral body height loss in C6. 6. Hypoenhancing lesion in the  left thyroid lobe, which measures up to 2.2 cm. If this has not previously been evaluated, a non-emergent ultrasound of the thyroid is recommended. (Reference: J Am Coll Radiol. 2015 Feb;12(2): 143-50) 7. Electronically Signed   By: Merilyn Baba M.D.   On: 11/09/2021 21:09   DG Chest 2 View  Result Date: 11/14/2021 CLINICAL DATA:  Pneumonia EXAM: CHEST - 2 VIEW COMPARISON:  Chest radiograph 11/07/2021 FINDINGS: Normal cardiac silhouette. RIGHT perihilar mass measuring 5 cm again noted. Mild atelectasis along the LEFT midlung fissure. Lytic lesion in the LEFT humerus. IMPRESSION: 1. No acute findings. 2. Large RIGHT perihilar mass 3.  Lytic lesion in the LEFT humerus. Electronically Signed   By: Suzy Bouchard M.D.   On: 11/14/2021 08:32   DG Chest 2 View  Result Date: 10/22/2021 CLINICAL DATA:  Contusion of the rib on the left side. Left rib pain. EXAM: CHEST - 2 VIEW COMPARISON:  None. FINDINGS: The heart size and mediastinal contours are within normal limits. Soft tissue density mass with area of atelectasis in the right mid lung measuring approximately 3.0 x 4.9 cm. Bibasilar atelectasis. Hyperinflated lungs. No pleural effusion or pneumothorax. The visualized skeletal structures are unremarkable. IMPRESSION: 1. Soft tissue density mass in the right mid lung measuring at least 3.0 x 4.9 cm, it may represent pneumonia, loculated pleural effusion or pulmonary mass. Further evaluation with CT examination would be helpful. Follow-up to resolution is recommended. 2. Hyperinflated lungs concerning for COPD. Bibasilar atelectasis. No pleural effusion or pneumothorax. An attempt was made to reach out to the provider Dr. Clemmie Krill without response from our office at the time of dictation. Electronically Signed   By: Keane Police D.O.   On: 10/22/2021 16:07   DG Ribs Unilateral W/Chest Left  Result Date: 11/06/2021 CLINICAL DATA:  Fall, left rib pain EXAM: LEFT RIBS AND CHEST - 3+ VIEW COMPARISON:  Chest x-ray 10/22/2021 FINDINGS: No acute fracture identified in the left ribs. Cardiomediastinal silhouette is unchanged. Persistent ovoid masslike opacity adjacent to the fissure in the right lung measuring approximately 5 x 3.2 cm, grossly unchanged. Linear opacities in the left lower lung zone likely represent subsegmental atelectasis. Hyperinflated lungs with mildly prominent interstitial lung markings. No pleural effusion or pneumothorax visualized. IMPRESSION: 1. No left rib fractures identified. 2. Persistent and unchanged ovoid perifissural masslike opacity in the right lung. 3. COPD. Electronically Signed   By: Ofilia Neas M.D.   On:  11/06/2021 12:16   CT HEAD WO CONTRAST (5MM)  Result Date: 11/15/2021 CLINICAL DATA:  Followup embolic stroke.  Decline in mental status. EXAM: CT HEAD WITHOUT CONTRAST TECHNIQUE: Contiguous axial images were obtained from the base of the skull through the vertex without intravenous contrast. RADIATION DOSE REDUCTION: This exam was performed according to the departmental dose-optimization program which includes automated exposure control, adjustment of the mA and/or kV according to patient size and/or use of iterative reconstruction technique. COMPARISON:  CT dated 11/09/2021.  MRI dated 11/13/2021 FINDINGS: Brain: The ventricles and sulci appropriate size for patient's age. Faint small white matter hypodensities in the left frontal convexity (18/2) as well as additional hypodense focus in the right cerebellum 6 corresponds to the findings of the MRI. These were better seen on the MRI exam. No acute intracranial hemorrhage. No mass effect or midline shift. No extra-axial fluid collection. Vascular: No hyperdense vessel or unexpected calcification. Skull: Normal. Negative for fracture or focal lesion. Sinuses/Orbits: No acute finding. Other: None IMPRESSION: 1. No acute intracranial pathology. 2. Faint  small white matter hypodensities in the left frontal convexity and right cerebellum corresponds to the findings of the MRI. These were better seen on the MRI exam. Electronically Signed   By: Anner Crete M.D.   On: 11/15/2021 23:08   CT Angio Chest PE W and/or Wo Contrast  Result Date: 11/06/2021 CLINICAL DATA:  Chest pain, recent trauma EXAM: CT ANGIOGRAPHY CHEST WITH CONTRAST TECHNIQUE: Multidetector CT imaging of the chest was performed using the standard protocol during bolus administration of intravenous contrast. Multiplanar CT image reconstructions and MIPs were obtained to evaluate the vascular anatomy. RADIATION DOSE REDUCTION: This exam was performed according to the departmental dose-optimization  program which includes automated exposure control, adjustment of the mA and/or kV according to patient size and/or use of iterative reconstruction technique. CONTRAST:  154mL OMNIPAQUE IOHEXOL 350 MG/ML SOLN COMPARISON:  Chest radiographs done earlier today FINDINGS: Cardiovascular: There is homogeneous enhancement in the thoracic aorta. There is mild ectasia of main pulmonary artery measuring 3.2 cm. There is saddle embolus in the left main pulmonary artery extending into the left upper lobe and left lower lobe branches. There are few small filling defects in the subsegmental branches in the right middle lobe and right lower lobe. There are no imaging signs of right ventricular strain. Mediastinum/Nodes: No significant lymphadenopathy seen. There is 2.5 cm low-density lesion in the enlarged left lobe of thyroid. Lungs/Pleura: There is moderate right pleural effusion. There are small patchy infiltrates in the medial aspect of right upper lobe in the right upper lung fields. There is 5.2 x 4.1 cm homogeneous density inseparable from interlobar fissure in the anterior right parahilar region. There is narrowing of the lumen of the bronchus leading to this homogeneous opacity in the anteromedial right mid lung fields. Linear patchy infiltrates are seen in both lower lung fields, more so on the right side. In image 13 of series 6, there is 2 cm ground-glass density in the anterior left apex. There is moderate right pleural effusion. There is no significant left pleural effusion. There is no pneumothorax. Upper Abdomen: Fatty liver. Musculoskeletal: There are numerous lytic lesions of varying sizes in the left ribs, thoracic spine and lumbar spine. There is decrease in height of bodies of C6 vertebra. There is also decrease in height of bodies of T6, T7, T8 and T9 vertebrae. Review of the MIP images confirms the above findings. IMPRESSION: There is evidence of acute pulmonary embolism with small to moderate thrombus  burden. There are no imaging signs of acute right ventricular strain. There is no evidence of thoracic aortic dissection. There is 4.8 cm homogeneous opacity in the anterior right mid lung fields. This lesion has to be considered primary malignant neoplasm until proven otherwise. There is narrowing of bronchus leading to this parahilar density. PET-CT and biopsy as warranted should be considered. There are numerous lytic lesions of varying sizes along with compression fractures in the cervical and thoracic spine as described in the body of the report consistent with extensive skeletal metastatic disease. Moderate right pleural effusion. There are ground-glass and patchy alveolar infiltrates in both lungs as described in the body of the report suggesting multifocal pneumonia. There is 2.5 cm low-density nodule in the left lobe of thyroid. When the patient's clinical condition permits, thyroid sonogram may be considered. Electronically Signed   By: Elmer Picker M.D.   On: 11/06/2021 15:21   MR BRAIN W WO CONTRAST  Result Date: 11/13/2021 CLINICAL DATA:  Brain metastases suspected. EXAM: MRI HEAD WITHOUT  AND WITH CONTRAST TECHNIQUE: Multiplanar, multiecho pulse sequences of the brain and surrounding structures were obtained without and with intravenous contrast. CONTRAST:  78mL GADAVIST GADOBUTROL 1 MMOL/ML IV SOLN COMPARISON:  Head MRI 11/09/2021 FINDINGS: Brain: Compared to the recent prior MRI, there are multiple new small foci of cortical and subcortical restricted diffusion in both cerebral hemispheres primarily in the frontal lobes (with examples annotated with arrows on series 5), and there are also new small foci of restricted diffusion in both cerebellar hemispheres. These are nonenhancing and consistent with interval acute infarcts. Numerous other small foci of milder diffusion weighted signal abnormality are again seen throughout both cerebral hemispheres and cerebellum as described on the recent  prior MRI, and multiple small foci of enhancement are again seen throughout both cerebral hemispheres, some of which correspond to some of the areas of mild diffusion weighted signal abnormality while many do not. These enhancing foci demonstrate mild mixed interval changes compared to the recent prior MRI, with some appearing slightly larger, others appearing slightly regressed, and others not as well seen due to motion artifact on the current examination. A 10 mm ring-enhancing lesion with hemosiderin deposition in the inferior left frontal lobe is unchanged (series 18, image 86), as is a 6 mm ring-enhancing lesion in the inferior right temporal lobe (series 18, image 52). No midline shift or extra-axial fluid collection is evident. The ventricles and sulci are normal. There is a background of mild chronic small vessel ischemia in the cerebral white matter. Vascular: Hypoplastic, poorly visualized distal right vertebral artery more fully evaluated on recent CTA. Other major intracranial vascular flow voids are preserved. Skull and upper cervical spine: Multiple bone metastases in the cervical spine, more fully evaluated on today's separate dedicated spine MRI. Unchanged subcentimeter enhancing focus in the right occipital skull. Sinuses/Orbits: Unremarkable orbits.  Small right mastoid effusion. Other: None. IMPRESSION: 1. Multiple small acute cerebral and cerebellar infarcts which are new from 11/09/2021. 2. Numerous small foci of enhancement throughout both cerebral hemispheres with very mild mixed interval changes since 11/09/2021. Some of these have an appearance most suggestive of subacute embolic infarcts, while others are more suspicious for metastatic disease (specifically ring-enhancing lesions in the inferior left frontal lobe and right temporal lobe). Short interval imaging follow-up may be helpful for differentiation. 3. Known osseous metastatic disease. Electronically Signed   By: Logan Bores M.D.    On: 11/13/2021 16:18   MR BRAIN W WO CONTRAST  Result Date: 11/09/2021 CLINICAL DATA:  Weight loss, brain metastases suspected EXAM: MRI HEAD WITHOUT AND WITH CONTRAST TECHNIQUE: Multiplanar, multiecho pulse sequences of the brain and surrounding structures were obtained without and with intravenous contrast. CONTRAST:  44mL GADAVIST GADOBUTROL 1 MMOL/ML IV SOLN COMPARISON:  None. FINDINGS: Brain: Round, peripherally enhancing lesion in the left inferior frontal lobe, which measures up to 9 x 9 mm (series 18, image 17), associated hemosiderin deposition, concerning for a hemorrhagic metastasis. This area is associated with hemosiderin deposition. Peripherally enhancing lesion in the left frontal lobe measures up to 4 mm (series 18, image 127). Multiple additional enhancing foci are also seen, some of which correlate with areas of restricted diffusion, described below, while some appear more masslike. These enhancing foci are seen on series 18 on the following images: 136, 120, 117, 115 113, 111, 108, 106, 104, 95, 94, 93, 92, 90, 87, 84, 80, 78, 77, 76, 70, and 59. Scattered foci of increased signal on diffusion-weighted imaging throughout the bilateral cerebral and cerebellar hemispheres, some  of which demonstrate ADC correlates; for example several cortical foci and white matter areas in the posterior right frontal lobe (series 5, image 32-34), in the left anterior frontal lobe (series 5, image 27-28), areas along the right insula (series 5, image 27), left caudate (series 5, image 28), and bilateral cerebellar hemispheres (series 5, image 12-14). Some foci of increased signal on diffusion-weighted imaging do not demonstrate ADC correlates, and may reflect subacute infarcts with normalization of the ADC. However, some of these correlate with foci of enhancement such as on series 18, image 104. No acute hemorrhage or midline shift. No hydrocephalus or extra-axial collection Vascular: Normal flow voids. Skull and  upper cervical spine: Abnormal enhancement in the arch (series 18, image 15) and bilateral lateral masses of C1 (series 18, image 8 and 10), as well as the anterior aspect of C2 (series 18, image 1). Small enhancing focus in the right occipital calvarium (series 18, image 72 Sinuses/Orbits: Negative. Other: Fluid throughout the right mastoid air cells. IMPRESSION: 1. Two round, peripherally enhancing lesions in the left frontal lobe, concerning for metastatic disease. Numerous additional punctate and linear areas of enhancement are also seen, some of which may represent additional metastatic lesions, but some of which may represent enhancing, subacute infarcts. 2. Numerous foci of restricted diffusion in the bilateral cerebral and cerebellar hemispheres, some of which demonstrate ADC correlates and some of which may have normalized ADC values. These are concerning for acute and subacute infarcts, likely embolic in etiology given multiple vascular territories. 3. Abnormal enhancement in C1, C2, and the right occipital calvarium, concerning for osseous metastatic disease. These results were called by telephone at the time of interpretation on 11/09/2021 at 3:58 am to provider [facility] , who verbally acknowledged these results. Electronically Signed   By: Merilyn Baba M.D.   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 Recent pulmonary embolism. EXAM: MRI CERVICAL SPINE WITHOUT AND WITH CONTRAST TECHNIQUE: Multiplanar and multiecho pulse sequences of the cervical spine, to include the craniocervical junction and cervicothoracic junction, were obtained without and with intravenous contrast. CONTRAST:  64mL GADAVIST GADOBUTROL 1 MMOL/ML IV SOLN COMPARISON:  Chest CT 05/26/2022. Cervical MRI 11/10/2008. Cranial MRI 11/09/2021 and today. FINDINGS: Alignment: Physiologic. Vertebrae: Multifocal osseous  metastatic disease throughout the cervical and upper thoracic spine, involving all segments except C3. Probable associated pathologic fractures at C5, C6 and T3 without significant osseous retropulsion or epidural tumor. Probable necrotic metastasis with peripheral enhancement at the right T2 costovertebral junction, measuring up to 1.9 cm on image 2/9. Cord: Normal in signal and caliber. No abnormal intradural enhancement. 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 See separate MRI brain report.Bilateral vertebral artery flow voids. There is a complex enhancing mass involving the left thyroid lobe, incompletely visualized this measures up to 2.8 cm on sagittal image 12/15. Disc levels: Relatively mild multilevel spondylosis with disc bulging and uncinate spurring greatest at C5-6 and C6-7. There are scattered facet degenerative changes. No evidence of cord compression. Mild multilevel foraminal narrowing, greatest on the left at C4-5 and on the right at C6-7. IMPRESSION: 1. Multifocal osseous metastatic disease within the visualized cervicothoracic spine with probable pathologic fractures at C5, C6 and T3. No cord compression. 2. Relatively mild multilevel spondylosis with scattered mild foraminal narrowing. 3. Right cerebellar vermian infarcts.  See separate MRI head  report. 4. Complex enhancing left thyroid nodule. Given comorbidities, this may not be clinically significant, although could be further evaluated with thyroid ultrasound and biopsy as clinically warranted. Electronically Signed   By: Richardean Sale M.D.   On: 11/13/2021 15:59   CT ABDOMEN PELVIS W CONTRAST  Result Date: 11/06/2021 CLINICAL DATA:  Abdominal pain, acute, nonlocalized. Status post fall EXAM: CT ABDOMEN AND PELVIS WITH CONTRAST TECHNIQUE: Multidetector CT imaging of the abdomen and pelvis was performed using the standard protocol  following bolus administration of intravenous contrast. RADIATION DOSE REDUCTION: This exam was performed according to the departmental dose-optimization program which includes automated exposure control, adjustment of the mA and/or kV according to patient size and/or use of iterative reconstruction technique. CONTRAST:  160mL OMNIPAQUE IOHEXOL 350 MG/ML SOLN COMPARISON:  None. FINDINGS: Lower chest: Right pleural effusion. Please see separately dictated CT angiography chest 11/06/2021. Ports and Devices: None. Liver: Not enlarged. Several vague hypodense right hepatic lobe lesions measuring 0.9, 1.4, 0.7, 0.5 cm. No laceration or subcapsular hematoma. Biliary System: The gallbladder is otherwise unremarkable with no radio-opaque gallstones. No biliary ductal dilatation. Pancreas: Normal pancreatic contour. No main pancreatic duct dilatation. Spleen: Not enlarged. No focal lesion. No laceration, subcapsular hematoma, or vascular injury. Adrenal Glands: No nodularity bilaterally. Kidneys: Bilateral kidneys enhance symmetrically. No hydronephrosis. Subcentimeter hypodensities are too small characterize. A 1.1 cm vague wedge-shaped hypodensity along the left superior renal pole that likely represents a poorly defined lesion with a density of 45 Hounsfield units. No contusion, laceration, or subcapsular hematoma. No injury to the vascular structures or collecting systems. No hydroureter. The urinary bladder is unremarkable. Bowel: No small or large bowel wall thickening or dilatation. Non-visualization of the appendix. The appendix is not definitely identified with no inflammatory changes in the right lower quadrant to suggest acute appendicitis. Mesentery, Omentum, and Peritoneum: No simple free fluid ascites. No pneumoperitoneum. No hemoperitoneum. No mesenteric hematoma identified. No organized fluid collection. Pelvic Organs: Normal. Lymph Nodes: No abdominal, pelvic, inguinal lymphadenopathy. Vasculature: No  abdominal aorta or iliac aneurysm. No active contrast extravasation or pseudoaneurysm. Musculoskeletal: No significant soft tissue hematoma. No acute pelvic fracture. No spinal fracture. Diffuse appendicular and axial skeleton lytic lesions. IMPRESSION: 1.  No acute traumatic injury to the abdomen, or pelvis. 2. No acute fracture or traumatic malalignment of the lumbar spine. 3. Several vague indeterminate hypodense right hepatic lobe lesions measuring 0.9, 1.4, 0.7, 0.5 cm. Findings could represent metastases. 4. 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 5. An indeterminate 1.1 cm vague slightly wedge-shaped hypodensity along the left superior renal pole. Finding could represent an underlying poorly defined lesion, small infarction, less likely focal pyelonephritis. 6. Please see separately dictated CT angiography chest 11/06/2021. Electronically Signed   By: Iven Finn M.D.   On: 11/06/2021 15:15   PERIPHERAL VASCULAR CATHETERIZATION  Result Date: 11/08/2021 See surgical note for result.  US BIOPSY (LIVER)  Result Date: 11/11/2021 INDICATION: Liver lesion EXAM: Ultrasound-guided core needle biopsy of liver lesion in the right hepatic lobe MEDICATIONS: None. ANESTHESIA/SEDATION: Moderate (conscious) sedation was employed during this procedure. A total of Versed 1 mg and Fentanyl 50 mcg was administered intravenously by the radiology nurse. Total intra-service moderate Sedation Time: 10 minutes. The patient's level of consciousness and vital signs were monitored continuously by radiology nursing throughout the procedure under my direct supervision. FLUOROSCOPY: N/a COMPLICATIONS: None immediate. PROCEDURE: Informed written consent was obtained from the patient after a thorough discussion of the procedural risks, benefits and alternatives. All questions  were addressed. Maximal Sterile Barrier Technique was utilized including caps, mask, sterile gowns, sterile gloves, sterile drape, hand  hygiene and skin antiseptic. A timeout was performed prior to the initiation of the procedure. The patient was placed supine on the exam table. Limited ultrasound exam of the liver was performed, which again demonstrated an approximately 1.1 cm hypoechoic nodule in the right hepatic lobe, compatible with recent CT demonstrating multiple small liver lesions. Skin entry site was marked, and the overlying skin was prepped and draped in a standard sterile fashion. Local analgesia was obtained with 1% lidocaine. Under ultrasound guidance, a 17 gauge introducer needle was advanced towards the identified lesion in the right hepatic lobe. Subsequently, core needle biopsy was performed using an 18 gauge core biopsy device x4 passes. Specimens were submitted in formalin to pathology for further handling. Limited postprocedure imaging demonstrated a small hypoechoic area deep to the lesion, suggestive of a small intraparenchymal hematoma. Review of biopsy images demonstrated likely injury to a small portal vein branch. This area was found to be stable over a short observation period. No subcapsular hematoma or bleeding into the hepatorenal space was identified. A clean dressing was placed after manual hemostasis. The patient tolerated the procedure well. IMPRESSION: 1. Successful ultrasound-guided core needle biopsy of a 1.1 cm lesion in the right hepatic lobe. 2. Postprocedure images demonstrate a small intraparenchymal hematoma which was stable over a short observation period. Interventional radiology will follow up with this patient in the morning. IV heparin is to be held pending following day AM CBC demonstrating stable hemoglobin. Electronically Signed   By: Albin Felling M.D.   On: 11/11/2021 16:12   DG Chest Port 1 View  Result Date: 11/07/2021 CLINICAL DATA:  Status post thorenthesis EXAM: PORTABLE CHEST 1 VIEW COMPARISON:  Chest radiograph 10/22/2021. Chest CT November 06, 2021. FINDINGS: Similar versus mildly  increased right midlung masslike opacity. There is some fluid tracking along the right minor fissure. No visible pneumothorax. No consolidation. Similar cardiomediastinal silhouette. IMPRESSION: 1. Similar versus mildly increased right midlung masslike opacity, further characterized on recent chest CT. There is some fluid tracking along the right minor fissure. 2. No visible pneumothorax status post thoracentesis. Electronically Signed   By: Margaretha Sheffield M.D.   On: 11/07/2021 14:43   ECHOCARDIOGRAM COMPLETE  Result Date: 11/07/2021    ECHOCARDIOGRAM REPORT   Patient Name:   HAELY LEYLAND Date of Exam: 11/07/2021 Medical Rec #:  [id number]      Height:       65.5 in Accession #:    [id number]     Weight:       125.7 lb Date of Birth:  [birth year]      BSA:          1.633 m Patient Age:    55 years       BP:           124/80 mmHg Patient Gender: F              HR:           78 bpm. Exam Location:  [facility] Procedure: 2D Echo, Cardiac Doppler and Color Doppler Indications:     Pulmonary Embolism I26.09  History:         Patient has no prior history of Echocardiogram examinations.                  Pulmonary emboli, acute pulmonary embolism.  Sonographer:     Sherrie Sport Referring  Phys:  [id number] Lequita Halt Diagnosing Phys: Donnelly Angelica IMPRESSIONS  1. Left ventricular ejection fraction, by estimation, is 60 to 65%. The left ventricle has normal function. The left ventricle has no regional wall motion abnormalities. Left ventricular diastolic parameters are consistent with Grade I diastolic dysfunction (impaired relaxation).  2. Right ventricular systolic function is normal. The right ventricular size is mildly enlarged. There is mildly elevated pulmonary artery systolic pressure. The estimated right ventricular systolic pressure is 84.6 mmHg.  3. Focal thickening of MV leaflet. Correlate clinically. . The mitral valve is degenerative. Mild mitral valve regurgitation. No evidence of mitral stenosis.  4. The aortic valve  is normal in structure. Aortic valve regurgitation is not visualized. No aortic stenosis is present.  5. The inferior vena cava is normal in size with <50% respiratory variability, suggesting right atrial pressure of 8 mmHg. FINDINGS  Left Ventricle: Left ventricular ejection fraction, by estimation, is 60 to 65%. The left ventricle has normal function. The left ventricle has no regional wall motion abnormalities. The left ventricular internal cavity size was normal in size. There is  no left ventricular hypertrophy. Left ventricular diastolic parameters are consistent with Grade I diastolic dysfunction (impaired relaxation). Right Ventricle: The right ventricular size is mildly enlarged. No increase in right ventricular wall thickness. Right ventricular systolic function is normal. There is mildly elevated pulmonary artery systolic pressure. The tricuspid regurgitant velocity is 2.81 m/s, and with an assumed right atrial pressure of 8 mmHg, the estimated right ventricular systolic pressure is 96.2 mmHg. Left Atrium: Left atrial size was normal in size. Right Atrium: Right atrial size was normal in size. Pericardium: There is no evidence of pericardial effusion. Mitral Valve: Focal thickening of MV leaflet. Correlate clinically. The mitral valve is degenerative in appearance. Mild mitral valve regurgitation. No evidence of mitral valve stenosis. MV peak gradient, 6.7 mmHg. The mean mitral valve gradient is 3.0 mmHg. Tricuspid Valve: The tricuspid valve is normal in structure. Tricuspid valve regurgitation is mild . No evidence of tricuspid stenosis. Aortic Valve: The aortic valve is normal in structure. Aortic valve regurgitation is not visualized. No aortic stenosis is present. Aortic valve mean gradient measures 3.0 mmHg. Aortic valve peak gradient measures 4.8 mmHg. Aortic valve area, by VTI measures 2.53 cm. Pulmonic Valve: The pulmonic valve was normal in structure. Pulmonic valve regurgitation is not  visualized. No evidence of pulmonic stenosis. Aorta: The aortic root is normal in size and structure. Venous: The inferior vena cava is normal in size with less than 50% respiratory variability, suggesting right atrial pressure of 8 mmHg. IAS/Shunts: No atrial level shunt detected by color flow Doppler.  LEFT VENTRICLE PLAX 2D LVIDd:         3.70 cm   Diastology LVIDs:         2.30 cm   LV e' medial:    5.11 cm/s LV PW:         1.40 cm   LV E/e' medial:  15.9 LV IVS:        1.10 cm   LV e' lateral:   4.35 cm/s LVOT diam:     2.00 cm   LV E/e' lateral: 18.7 LV SV:         52 LV SV Index:   32 LVOT Area:     3.14 cm  RIGHT VENTRICLE RV Basal diam:  2.90 cm RV S prime:     13.10 cm/s TAPSE (M-mode): 2.1 cm LEFT ATRIUM  Index        RIGHT ATRIUM           Index LA diam:        2.50 cm 1.53 cm/m   RA Area:     14.40 cm LA Vol (A2C):   55.7 ml 34.11 ml/m  RA Volume:   34.30 ml  21.01 ml/m LA Vol (A4C):   50.6 ml 30.99 ml/m LA Biplane Vol: 53.7 ml 32.89 ml/m  AORTIC VALVE                    PULMONIC VALVE AV Area (Vmax):    2.69 cm     PV Vmax:        0.70 m/s AV Area (Vmean):   2.50 cm     PV Vmean:       47.133 cm/s AV Area (VTI):     2.53 cm     PV VTI:         0.128 m AV Vmax:           109.00 cm/s  PV Peak grad:   1.9 mmHg AV Vmean:          74.700 cm/s  PV Mean grad:   1.0 mmHg AV VTI:            0.206 m      RVOT Peak grad: 3 mmHg AV Peak Grad:      4.8 mmHg AV Mean Grad:      3.0 mmHg LVOT Vmax:         93.30 cm/s LVOT Vmean:        59.400 cm/s LVOT VTI:          0.166 m LVOT/AV VTI ratio: 0.81  AORTA Ao Root diam: 3.17 cm MITRAL VALVE                TRICUSPID VALVE MV Area (PHT): 3.17 cm     TR Peak grad:   31.6 mmHg MV Area VTI:   1.83 cm     TR Vmax:        281.00 cm/s MV Peak grad:  6.7 mmHg MV Mean grad:  3.0 mmHg     SHUNTS MV Vmax:       1.29 m/s     Systemic VTI:  0.17 m MV Vmean:      75.9 cm/s    Systemic Diam: 2.00 cm MV Decel Time: 239 msec     Pulmonic VTI:  0.158 m MV E  velocity: 81.40 cm/s MV A velocity: 109.00 cm/s MV E/A ratio:  0.75 Donnelly Angelica Electronically signed by Donnelly Angelica Signature Date/Time: 11/07/2021/1:06:16 PM    Final    US Abdomen Limited RUQ (LIVER/GB)  Result Date: 11/13/2021 CLINICAL DATA:  Status post liver biopsy 11/11/2021. Evaluate for hepatic hematoma. EXAM: ULTRASOUND ABDOMEN LIMITED RIGHT UPPER QUADRANT COMPARISON:  Biopsy 11/11/2021.  CT 11/06/2021. FINDINGS: Gallbladder: No gallstones or wall thickening visualized. No sonographic Murphy sign noted by sonographer. Common bile duct: Diameter: Normal, 2 mm. Liver: No evidence of intraparenchymal or perihepatic hematoma. Again identified are multiple liver lesions, including at up to 1.2 cm in the right liver lobe. These demonstrate hypoechogenicity with central echogenicity. Portal vein is patent on color Doppler imaging with normal direction of blood flow towards the liver. Other: None. IMPRESSION: No evidence of procedure related intraparenchymal or perihepatic hematoma. Multiple liver lesions, as before. These demonstrate a morphology which is suspicious for metastatic disease. Electronically Signed  By: Abigail Miyamoto M.D.   On: 11/13/2021 16:24   US THORACENTESIS ASP PLEURAL SPACE W/IMG GUIDE  Result Date: 11/07/2021 INDICATION: 66 year old with right lung mass and concern for metastatic disease. EXAM: ULTRASOUND GUIDED RIGHT THORACENTESIS MEDICATIONS: None. COMPLICATIONS: None immediate. PROCEDURE: An ultrasound guided thoracentesis was thoroughly discussed with the patient and questions answered. The benefits, risks, alternatives and complications were also discussed. The patient understands and wishes to proceed with the procedure. Written consent was obtained. Ultrasound was performed to localize and mark an adequate pocket of fluid in the right chest. The area was then prepped and draped in the normal sterile fashion. 1% Lidocaine was used for local anesthesia. Under ultrasound guidance a 6 Fr  Safe-T-Centesis catheter was introduced. Thoracentesis was performed. The catheter was removed and a dressing applied. FINDINGS: A total of approximately 350 mL of amber colored fluid was removed. Samples were sent to the laboratory as requested by the clinical team. IMPRESSION: Successful ultrasound guided right thoracentesis yielding 350 mL of pleural fluid. Electronically Signed   By: Markus Daft M.D.   On: 11/07/2021 15:01    Microbiology: Results for orders placed or performed during the hospital encounter of 11/06/21  Resp Panel by RT-PCR (Flu A&B, Covid) Nasopharyngeal Swab     Status: None   Collection Time: 11/06/21  3:49 PM   Specimen: Nasopharyngeal Swab; Nasopharyngeal(NP) swabs in vial transport medium  Result Value Ref Range Status   SARS Coronavirus 2 by RT PCR NEGATIVE NEGATIVE Final    Comment: (NOTE) SARS-CoV-2 target nucleic acids are NOT DETECTED.  The SARS-CoV-2 RNA is generally detectable in upper respiratory specimens during the acute phase of infection. The lowest concentration of SARS-CoV-2 viral copies this assay can detect is 138 copies/mL. A negative result does not preclude SARS-Cov-2 infection and should not be used as the sole basis for treatment or other patient management decisions. A negative result may occur with  improper specimen collection/handling, submission of specimen other than nasopharyngeal swab, presence of viral mutation(s) within the areas targeted by this assay, and inadequate number of viral copies(<138 copies/mL). A negative result must be combined with clinical observations, patient history, and epidemiological information. The expected result is Negative.  Fact Sheet for Patients:  EntrepreneurPulse.com.au  Fact Sheet for Healthcare Providers:  IncredibleEmployment.be  This test is no t yet approved or cleared by the [place] FDA and  has been authorized for detection and/or diagnosis of  SARS-CoV-2 by FDA under an Emergency Use Authorization (EUA). This EUA will remain  in effect (meaning this test can be used) for the duration of the COVID-19 declaration under Section 564(b)(1) of the Act, 21 U.S.C.section 360bbb-3(b)(1), unless the authorization is terminated  or revoked sooner.       Influenza A by PCR NEGATIVE NEGATIVE Final   Influenza B by PCR NEGATIVE NEGATIVE Final    Comment: (NOTE) The Xpert Xpress SARS-CoV-2/FLU/RSV plus assay is intended as an aid in the diagnosis of influenza from Nasopharyngeal swab specimens and should not be used as a sole basis for treatment. Nasal washings and aspirates are unacceptable for Xpert Xpress SARS-CoV-2/FLU/RSV testing.  Fact Sheet for Patients: EntrepreneurPulse.com.au  Fact Sheet for Healthcare Providers: IncredibleEmployment.be  This test is not yet approved or cleared by the [place] FDA and has been authorized for detection and/or diagnosis of SARS-CoV-2 by FDA under an Emergency Use Authorization (EUA). This EUA will remain in effect (meaning this test can be used) for the duration of the COVID-19 declaration under  Section 564(b)(1) of the Act, 21 U.S.C. section 360bbb-3(b)(1), unless the authorization is terminated or revoked.  Performed at [facility], [address] [place], [place] [address]   CULTURE, BLOOD (ROUTINE X 2) w Reflex to ID Panel     Status: None (Preliminary result)   Collection Time: 11/07/21  4:37 PM   Specimen: BLOOD  Result Value Ref Range Status   Specimen Description BLOOD LEFT ANTECUBITAL  Final   Special Requests   Final    BOTTLES DRAWN AEROBIC AND ANAEROBIC Blood Culture adequate volume   Culture   Final    NO GROWTH 4 DAYS Performed at [facility], [address] [place], [place] [address]    Report Status PENDING  Incomplete  CULTURE, BLOOD (ROUTINE X 2) w Reflex to ID Panel     Status: None (Preliminary  result)   Collection Time: 11/07/21  4:38 PM   Specimen: BLOOD  Result Value Ref Range Status   Specimen Description BLOOD BLOOD LEFT ARM  Final   Special Requests   Final    BOTTLES DRAWN AEROBIC AND ANAEROBIC Blood Culture adequate volume   Culture   Final    NO GROWTH 4 DAYS Performed at [facility], [address] [place], [place] [address]    Report Status PENDING  Incomplete    Labs: CBC: Recent Labs  Lab 11/13/21 0609 11/14/21 0510 11/15/21 0317 11/16/21 0646 11/17/21 0445  WBC 14.6* 20.1* 21.0* 17.4* 14.3*  HGB 10.1* 9.6* 9.2* 8.5* 7.8*  HCT 32.1* 29.7* 28.0* 26.7* 24.9*  MCV 93.0 92.0 92.1 95.4 92.9  PLT 120* 93* 127* 139* 643   Basic Metabolic Panel: Recent Labs  Lab 11/13/21 0609 11/13/21 1221 11/14/21 0510 11/15/21 0317 11/16/21 0646 11/17/21 0445  NA 136  --  132* 130* 131* 131*  K 5.3* 4.4 4.3 4.0 3.7 4.1  CL 101  --  96* 95* 92* 95*  CO2 26  --  25 26 27 27   GLUCOSE 115*  --  138* 133* 109* 119*  BUN 37*  --  35* 39* 36* 28*  CREATININE 1.26*  --  1.10* 1.26* 1.18* 0.99  CALCIUM 9.6  --  9.0 8.5* 8.5* 8.6*  MG 2.4  --  2.4 2.5* 2.8* 2.9*   Liver Function Tests: No results for input(s): AST, ALT, ALKPHOS, BILITOT, PROT, ALBUMIN in the last 168 hours. CBG: No results for input(s): GLUCAP in the last 168 hours.  Discharge time spent: greater than 30 minutes.  Signed: Sharen Hones, MD [facility] 11/17/2021

## 2021-11-17 NOTE — Progress Notes (Signed)
PROGRESS NOTE    Cheryl Schroeder  CZY:[id number] DOB: 1955/12/31 DOA: 11/06/2021 PCP: Lynnell Jude, MD    Brief Narrative:   66 year old woman PMH recent outpatient diagnosis DVT treated with apixaban, presented with chest pain and frequent falls.  CT revealed acute saddle embolism and multiple PE as well as right middle lung mass and skeletal lesions concerning for malignancy.  Seen by pulmonology with recommendation for malignancy work-up. S/p right-sided thoracentesis. Seen by vascular surgery and underwent left pulmonary mechanical thrombectomy as well as placement of IVC.  Brain MRI concerning for metastatic disease as well as subacute infarcts.  Neurology consulted.  Awaiting cytology from thoracentesis.  Given need for anticoagulation, obtained liver biopsy prior to transition to oral anticoagulation.   Patient had a new onset of right arm weakness on 2/8, MRI of the brain showed new embolic strokes.  Heparin drip started, but due to significant drop in platelets, anticoagulation changed to argatroban 2/9.   2/10.  HIT antibody negative, anticoagulation changed to heparin.  Assessment & Plan:   Principal Problem:   Acute pulmonary embolism (HCC) Active Problems:   Malignant pleural effusion   Mass of right lung   Bone lesion   Liver lesion   Demand ischemia (HCC)   Acquired thrombophilia (HCC)   DVT of leg (deep venous thrombosis) (HCC)   Thyroid nodule   Renal lesion   Brain mass   CVA (cerebral vascular accident) ([place])   Body mass index (BMI) 19.9 or less, adult   Liver hematoma   Liver metastases (HCC)   Pleural effusion   Orthostatic hypotension   Palliative care encounter   Drug-induced constipation   Primary malignant neoplasm of lung metastatic to other site ([place])   Non-small cell lung cancer (HCC)  Opioid-induced constipation. Patient was given multiple stool softener laxatives, still no bowel movement.  We will give Amitiza.   New embolic stroke. Continue  Lovenox.  Patient and family wish to transfer to inpatient hospice, we may consider discontinue Lovenox after further discussion.  Thrombocytopenia. Worsening leukocytosis. Anemia.  Condition is more stable.     Right lung mass. 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 Thyroid nodule. Patient has a poor prognosis.  Family now request inpatient hospice.     Orthostatic hypotension. Acute kidney injury. Hyperkalemia secondary to acute kidney injury. Hyponatremia. No additional treatment for above.   Liver hematoma secondary to liver biopsy. Repeated right upper quadrant ultrasound did not show any hematoma, contusion has improved.   Acute pulm embolism. DVT. Acquired thrombophilia. Demand ischemia with elevated troponin secondary to PE. Still on Lovenox.  Malignant pleural effusion. S/p thoracentesis.   Urinary retention. Continue Foley catheter, given patient terminal condition, will discontinue Flomax.   Constipation. No bowel movement for 3 days, currently on scheduled MiraLAX, will add lactulose 20 g twice a day for now.     DVT prophylaxis: Lovenox. Code Status: DNR Family Communication: Sister updated Disposition Plan:      Status is: Inpatient   Remains inpatient appropriate because: Severity of disease   I/O last 3 completed shifts: In: 120 [P.O.:120] Out: 1150 [Urine:1150] Total I/O In: 240 [P.O.:240] Out: -      Consultants:  None  Procedures: None  Antimicrobials: None  Subjective: Patient started have some confusion today, no headache or dizziness. She still has not a bowel movement after multiple doses about laxatives.  No abdominal pain. No short of breath  Objective: Vitals:   11/16/21  2022 11/17/21 0018 11/17/21 0513 11/17/21 0846  BP: (!) 113/56 117/64 112/60 (!) 91/57  Pulse: 94 (!) 102 90 94  Resp: 16 18 18 18   Temp: 97.8 F (36.6 C) 100.2 F (37.9 C) 99.9 F (37.7 C) 98 F (36.7  C)  TempSrc: Oral Oral Oral   SpO2: 93% 93% 95% 91%  Weight:      Height:        Intake/Output Summary (Last 24 hours) at 11/17/2021 1110 Last data filed at 11/17/2021 0900 Gross per 24 hour  Intake 240 ml  Output 650 ml  Net -410 ml   Filed Weights   11/09/21 0500 11/11/21 0458 11/16/21 0500  Weight: 54.6 kg 56 kg 62.5 kg    Examination:  General exam: Appears calm and comfortable  Respiratory system: Clear to auscultation. Respiratory effort normal. Cardiovascular system: S1 & S2 heard, RRR. No JVD, murmurs, rubs, gallops or clicks. No pedal edema. Gastrointestinal system: Abdomen is nondistended, soft and nontender. No organomegaly or masses felt. Normal bowel sounds heard. Central nervous system: Alert and oriented x2. No focal neurological deficits. Extremities: Symmetric 5 x 5 power. Skin: No rashes, lesions or ulcers    Data Reviewed: I have personally reviewed following labs and imaging studies  CBC: Recent Labs  Lab 11/13/21 0609 11/14/21 0510 11/15/21 0317 11/16/21 0646 11/17/21 0445  WBC 14.6* 20.1* 21.0* 17.4* 14.3*  HGB 10.1* 9.6* 9.2* 8.5* 7.8*  HCT 32.1* 29.7* 28.0* 26.7* 24.9*  MCV 93.0 92.0 92.1 95.4 92.9  PLT 120* 93* 127* 139* 626   Basic Metabolic Panel: Recent Labs  Lab 11/13/21 0609 11/13/21 1221 11/14/21 0510 11/15/21 0317 11/16/21 0646 11/17/21 0445  NA 136  --  132* 130* 131* 131*  K 5.3* 4.4 4.3 4.0 3.7 4.1  CL 101  --  96* 95* 92* 95*  CO2 26  --  25 26 27 27   GLUCOSE 115*  --  138* 133* 109* 119*  BUN 37*  --  35* 39* 36* 28*  CREATININE 1.26*  --  1.10* 1.26* 1.18* 0.99  CALCIUM 9.6  --  9.0 8.5* 8.5* 8.6*  MG 2.4  --  2.4 2.5* 2.8* 2.9*   GFR: Estimated Creatinine Clearance: 51.4 mL/min (by C-G formula based on SCr of 0.99 mg/dL). Liver Function Tests: No results for input(s): AST, ALT, ALKPHOS, BILITOT, PROT, ALBUMIN in the last 168 hours. No results for input(s): LIPASE, AMYLASE in the last 168 hours. No results for  input(s): AMMONIA in the last 168 hours. Coagulation Profile: No results for input(s): INR, PROTIME in the last 168 hours. Cardiac Enzymes: No results for input(s): CKTOTAL, CKMB, CKMBINDEX, TROPONINI in the last 168 hours. BNP (last 3 results) No results for input(s): PROBNP in the last 8760 hours. HbA1C: No results for input(s): HGBA1C in the last 72 hours. CBG: No results for input(s): GLUCAP in the last 168 hours. Lipid Profile: No results for input(s): CHOL, HDL, LDLCALC, TRIG, CHOLHDL, LDLDIRECT in the last 72 hours. Thyroid Function Tests: No results for input(s): TSH, T4TOTAL, FREET4, T3FREE, THYROIDAB in the last 72 hours. Anemia Panel: No results for input(s): VITAMINB12, FOLATE, FERRITIN, TIBC, IRON, RETICCTPCT in the last 72 hours. Sepsis Labs: No results for input(s): PROCALCITON, LATICACIDVEN in the last 168 hours.  Recent Results (from the past 240 hour(s))  CULTURE, BLOOD (ROUTINE X 2) w Reflex to ID Panel     Status: None (Preliminary result)   Collection Time: 11/07/21  4:37 PM   Specimen: BLOOD  Result Value Ref Range Status   Specimen Description BLOOD LEFT ANTECUBITAL  Final   Special Requests   Final    BOTTLES DRAWN AEROBIC AND ANAEROBIC Blood Culture adequate volume   Culture   Final    NO GROWTH 4 DAYS Performed at [facility], [address], [place], [place] [address]    Report Status PENDING  Incomplete  CULTURE, BLOOD (ROUTINE X 2) w Reflex to ID Panel     Status: None (Preliminary result)   Collection Time: 11/07/21  4:38 PM   Specimen: BLOOD  Result Value Ref Range Status   Specimen Description BLOOD BLOOD LEFT ARM  Final   Special Requests   Final    BOTTLES DRAWN AEROBIC AND ANAEROBIC Blood Culture adequate volume   Culture   Final    NO GROWTH 4 DAYS Performed at [facility], [address] [place], [place] [address]    Report Status PENDING  Incomplete         Radiology Studies: CT HEAD WO CONTRAST  (5MM)  Result Date: 11/15/2021 CLINICAL DATA:  Followup embolic stroke.  Decline in mental status. EXAM: CT HEAD WITHOUT CONTRAST TECHNIQUE: Contiguous axial images were obtained from the base of the skull through the vertex without intravenous contrast. RADIATION DOSE REDUCTION: This exam was performed according to the departmental dose-optimization program which includes automated exposure control, adjustment of the mA and/or kV according to patient size and/or use of iterative reconstruction technique. COMPARISON:  CT dated 11/09/2021.  MRI dated 11/13/2021 FINDINGS: Brain: The ventricles and sulci appropriate size for patient's age. Faint small white matter hypodensities in the left frontal convexity (18/2) as well as additional hypodense focus in the right cerebellum 6 corresponds to the findings of the MRI. These were better seen on the MRI exam. No acute intracranial hemorrhage. No mass effect or midline shift. No extra-axial fluid collection. Vascular: No hyperdense vessel or unexpected calcification. Skull: Normal. Negative for fracture or focal lesion. Sinuses/Orbits: No acute finding. Other: None IMPRESSION: 1. No acute intracranial pathology. 2. Faint small white matter hypodensities in the left frontal convexity and right cerebellum corresponds to the findings of the MRI. These were better seen on the MRI exam. Electronically Signed   By: Anner Crete M.D.   On: 11/15/2021 23:08        Scheduled Meds:  (feeding supplement) PROSource Plus  30 mL Oral BID BM   Chlorhexidine Gluconate Cloth  6 each Topical Daily   docusate sodium  100 mg Oral BID   enoxaparin (LOVENOX) injection  1 mg/kg Subcutaneous Q12H   FLUoxetine  40 mg Oral Daily   fluticasone  1 spray Each Nare Daily   gabapentin  300 mg Oral TID   lactulose  20 g Oral BID   lamoTRIgine  200 mg Oral Daily   linaclotide  290 mcg Oral QAC breakfast   multivitamin with minerals  1 tablet Oral Daily   oxyCODONE  15 mg Oral Q8H    pantoprazole  40 mg Oral Daily   polyethylene glycol  17 g Oral Daily   pravastatin  40 mg Oral QHS   senna-docusate  2 tablet Oral BID   sodium chloride flush  3 mL Intravenous Q12H   sodium chloride  1 g Oral BID WC   tamsulosin  0.4 mg Oral QPC supper   Continuous Infusions:  sodium chloride       LOS: 11 days    Time spent: 52 minutes, more than 50%  time involving direct patient care.    Sharen Hones, MD [facility]   To contact the attending provider between 7A-7P or the covering provider during after hours 7P-7A, please log into the web site [email or web address] and access using universal [place] password for that web site. If you do not have the password, please call the hospital operator.  11/17/2021, 11:10 AM

## 2021-11-17 NOTE — Progress Notes (Signed)
Pt d/c to hospice via EMS. IV's and foley left in place per [facility].

## 2021-11-17 NOTE — TOC Transition Note (Signed)
Transition of Care [facility]) - CM/SW Discharge Note   Patient Details  Name: Cheryl Schroeder MRN: [id number] Date of Birth: 12/04/1955  Transition of Care [facility]) CM/SW Contact:  Magnus Ivan, LCSW Phone Number: 11/17/2021, 2:43 PM   Clinical Narrative:    Patient has a bed at [facility] in [place] today. Santiago Glad with [facility] has collaborated with patient's sister and will call EMS when hospice home and sister are ready.  EMS paperwork completed. Asked MD to sign DNR. No additional TOC needs prior to DC to hospice home.     Final next level of care: [place] Barriers to Discharge: Barriers Resolved   Patient Goals and CMS Choice Patient states their goals for this hospitalization and ongoing recovery are:: hospice home CMS Medicare.gov Compare Post Acute Care list provided to:: Patient Represenative (must comment) Choice offered to / list presented to : Sibling  Discharge Placement                Patient to be transferred to facility by: [facility]      Discharge Plan and Services In-house Referral: NA   Post Acute Care Choice: [place]                               Social Determinants of Health (SDOH) Interventions     Readmission Risk Interventions No flowsheet data found.

## 2021-11-17 NOTE — Progress Notes (Signed)
Manufacturing engineer [facility])  Referral received for [employer] home.. Patient information sent for review and hospice home eligibility has been confirmed by hospice physician Dr. Lyman Speller. Writer met with patient's sister Vicente Males and spoke via telephone with patient's sister Eustaquio Maize to initiate education regarding hospice home services, philosophy and team approach to care with understanding voiced.   Hospice home is able to offer a bed today and family has accepted.  Plan is for transport today once consents are completed.  Please have signed out of facility DNR in place for transport. Liaison to arrange non-emergent EMS.  Hospital care team has been updated. Staff RN to call report to [phone].  Thank you for the opportunity to be involved in the care of this patient and her family.  Flo Shanks BSN, RN, [place] [phone]

## 2021-11-17 NOTE — TOC Progression Note (Signed)
Transition of Care [facility]) - Progression Note    Patient Details  Name: Cheryl Schroeder MRN: [id number] Date of Birth: 04-04-1956  Transition of Care [facility]) CM/SW [place], LCSW Phone Number: 11/17/2021, 12:13 PM  Clinical Narrative:   CSW was notified by RN and MD that family would like patient to go to hospice home in [place] if possible. CSW spoke with [place] who will screen for hospice home candidacy.    Expected Discharge Plan: [place] Barriers to Discharge: Continued Medical Work up  Expected Discharge Plan and Services Expected Discharge Plan: [place] In-house Referral: NA   Post Acute Care Choice: [place] Living arrangements for the past 2 months: Single Family Home                                       Social Determinants of Health (SDOH) Interventions    Readmission Risk Interventions No flowsheet data found.

## 2021-11-18 LAB — CULTURE, BLOOD (ROUTINE X 2)
Culture: NO GROWTH
Culture: NO GROWTH
Special Requests: ADEQUATE
Special Requests: ADEQUATE

## 2021-11-21 ENCOUNTER — Encounter: Payer: Self-pay | Admitting: *Deleted

## 2021-11-21 ENCOUNTER — Other Ambulatory Visit: Payer: Medicare HMO

## 2021-11-21 NOTE — Progress Notes (Signed)
Not discussed. Patient opted for Hospice care

## 2021-11-21 NOTE — Progress Notes (Signed)
Nothing further needed at this time. Pt referred to hospice services.

## 2021-12-02 ENCOUNTER — Encounter: Payer: Self-pay | Admitting: Oncology

## 2021-12-04 DEATH — deceased

## 2022-04-24 ENCOUNTER — Ambulatory Visit: Payer: Medicare HMO | Admitting: Dermatology
